# Patient Record
Sex: Female | Born: 1960
Health system: Southern US, Community
[De-identification: ages and names within clinical notes are randomized; demographics above are authoritative.]

## PROBLEM LIST (undated history)

## (undated) DIAGNOSIS — R590 Localized enlarged lymph nodes: Secondary | ICD-10-CM

## (undated) DIAGNOSIS — K219 Gastro-esophageal reflux disease without esophagitis: Secondary | ICD-10-CM

## (undated) DIAGNOSIS — D649 Anemia, unspecified: Secondary | ICD-10-CM

## (undated) DIAGNOSIS — I10 Essential (primary) hypertension: Secondary | ICD-10-CM

## (undated) DIAGNOSIS — C801 Malignant (primary) neoplasm, unspecified: Secondary | ICD-10-CM

## (undated) DIAGNOSIS — M1990 Unspecified osteoarthritis, unspecified site: Secondary | ICD-10-CM

## (undated) HISTORY — PX: TUBAL LIGATION: SHX77

## (undated) HISTORY — DX: Morbid (severe) obesity due to excess calories: E66.01

---

## 1999-11-06 ENCOUNTER — Emergency Department (HOSPITAL_COMMUNITY): Admission: EM | Admit: 1999-11-06 | Discharge: 1999-11-06 | Payer: Self-pay | Admitting: Emergency Medicine

## 1999-11-06 ENCOUNTER — Encounter: Payer: Self-pay | Admitting: Emergency Medicine

## 2000-10-03 ENCOUNTER — Emergency Department (HOSPITAL_COMMUNITY): Admission: EM | Admit: 2000-10-03 | Discharge: 2000-10-03 | Payer: Self-pay | Admitting: Emergency Medicine

## 2000-10-03 ENCOUNTER — Encounter: Payer: Self-pay | Admitting: Emergency Medicine

## 2002-01-12 ENCOUNTER — Emergency Department (HOSPITAL_COMMUNITY): Admission: EM | Admit: 2002-01-12 | Discharge: 2002-01-12 | Payer: Self-pay | Admitting: Emergency Medicine

## 2002-07-24 ENCOUNTER — Emergency Department (HOSPITAL_COMMUNITY): Admission: EM | Admit: 2002-07-24 | Discharge: 2002-07-24 | Payer: Self-pay | Admitting: Emergency Medicine

## 2002-07-24 ENCOUNTER — Encounter: Payer: Self-pay | Admitting: Emergency Medicine

## 2002-11-11 ENCOUNTER — Emergency Department (HOSPITAL_COMMUNITY): Admission: EM | Admit: 2002-11-11 | Discharge: 2002-11-11 | Payer: Self-pay | Admitting: Emergency Medicine

## 2003-03-20 ENCOUNTER — Emergency Department (HOSPITAL_COMMUNITY): Admission: EM | Admit: 2003-03-20 | Discharge: 2003-03-20 | Payer: Self-pay | Admitting: Emergency Medicine

## 2003-05-18 ENCOUNTER — Emergency Department (HOSPITAL_COMMUNITY): Admission: EM | Admit: 2003-05-18 | Discharge: 2003-05-18 | Payer: Self-pay | Admitting: *Deleted

## 2004-04-08 ENCOUNTER — Emergency Department (HOSPITAL_COMMUNITY): Admission: EM | Admit: 2004-04-08 | Discharge: 2004-04-08 | Payer: Self-pay | Admitting: Emergency Medicine

## 2006-06-02 ENCOUNTER — Emergency Department (HOSPITAL_COMMUNITY): Admission: EM | Admit: 2006-06-02 | Discharge: 2006-06-02 | Payer: Self-pay | Admitting: Emergency Medicine

## 2006-07-03 ENCOUNTER — Emergency Department (HOSPITAL_COMMUNITY): Admission: EM | Admit: 2006-07-03 | Discharge: 2006-07-04 | Payer: Self-pay | Admitting: Emergency Medicine

## 2007-05-27 ENCOUNTER — Emergency Department (HOSPITAL_COMMUNITY): Admission: EM | Admit: 2007-05-27 | Discharge: 2007-05-27 | Payer: Self-pay | Admitting: Emergency Medicine

## 2008-02-23 ENCOUNTER — Emergency Department (HOSPITAL_COMMUNITY): Admission: EM | Admit: 2008-02-23 | Discharge: 2008-02-23 | Payer: Self-pay | Admitting: Emergency Medicine

## 2008-05-07 ENCOUNTER — Emergency Department (HOSPITAL_COMMUNITY): Admission: EM | Admit: 2008-05-07 | Discharge: 2008-05-07 | Payer: Self-pay | Admitting: Emergency Medicine

## 2008-05-20 ENCOUNTER — Ambulatory Visit (HOSPITAL_COMMUNITY): Admission: RE | Admit: 2008-05-20 | Discharge: 2008-05-20 | Payer: Self-pay | Admitting: Family Medicine

## 2008-05-26 ENCOUNTER — Encounter: Admission: RE | Admit: 2008-05-26 | Discharge: 2008-05-26 | Payer: Self-pay | Admitting: Family Medicine

## 2009-03-09 ENCOUNTER — Emergency Department (HOSPITAL_COMMUNITY): Admission: EM | Admit: 2009-03-09 | Discharge: 2009-03-09 | Payer: Self-pay | Admitting: Emergency Medicine

## 2009-04-09 ENCOUNTER — Emergency Department (HOSPITAL_COMMUNITY): Admission: EM | Admit: 2009-04-09 | Discharge: 2009-04-09 | Payer: Self-pay | Admitting: Emergency Medicine

## 2009-09-08 ENCOUNTER — Emergency Department (HOSPITAL_COMMUNITY): Admission: EM | Admit: 2009-09-08 | Discharge: 2009-09-08 | Payer: Self-pay | Admitting: Emergency Medicine

## 2010-08-01 LAB — CBC
HCT: 24.8 % — ABNORMAL LOW (ref 36.0–46.0)
Hemoglobin: 7.5 g/dL — CL (ref 12.0–15.0)
Platelets: 258 10*3/uL (ref 150–400)
RBC: 4.3 MIL/uL (ref 3.87–5.11)
RDW: 21.8 % — ABNORMAL HIGH (ref 11.5–15.5)

## 2010-08-01 LAB — COMPREHENSIVE METABOLIC PANEL
ALT: 14 U/L (ref 0–35)
AST: 14 U/L (ref 0–37)
Albumin: 3 g/dL — ABNORMAL LOW (ref 3.5–5.2)
Calcium: 8.2 mg/dL — ABNORMAL LOW (ref 8.4–10.5)
Chloride: 109 mEq/L (ref 96–112)
Creatinine, Ser: 0.64 mg/dL (ref 0.4–1.2)
GFR calc Af Amer: >60 mL/min (ref >60)
Glucose, Bld: 116 mg/dL — ABNORMAL HIGH (ref 70–99)
Potassium: 3.8 mEq/L (ref 3.5–5.1)
Total Protein: 6.7 g/dL (ref 6.0–8.3)

## 2010-08-01 LAB — DIFFERENTIAL
Basophils Absolute: 0 10*3/uL (ref 0.0–0.1)
Eosinophils Relative: 0 % (ref 0–5)
Lymphocytes Relative: 13 % (ref 12–46)
Monocytes Relative: 0 % — ABNORMAL LOW (ref 3–12)
Neutro Abs: 5.7 10*3/uL (ref 1.7–7.7)

## 2010-09-09 ENCOUNTER — Emergency Department (HOSPITAL_COMMUNITY)
Admission: EM | Admit: 2010-09-09 | Discharge: 2010-09-09 | Disposition: A | Discharge status: Home / Self Care | Payer: 59 | Attending: Emergency Medicine | Admitting: Emergency Medicine

## 2010-09-09 DIAGNOSIS — R51 Headache: Secondary | ICD-10-CM | POA: Insufficient documentation

## 2010-09-09 DIAGNOSIS — I10 Essential (primary) hypertension: Secondary | ICD-10-CM | POA: Insufficient documentation

## 2010-09-09 LAB — POCT I-STAT, CHEM 8
Chloride: 106 mEq/L (ref 96–112)
Glucose, Bld: 94 mg/dL (ref 70–99)
HCT: 41 % (ref 36.0–46.0)

## 2011-03-20 ENCOUNTER — Emergency Department (HOSPITAL_COMMUNITY)
Admission: EM | Admit: 2011-03-20 | Discharge: 2011-03-20 | Disposition: A | Discharge status: Home / Self Care | Payer: 59 | Attending: Emergency Medicine | Admitting: Emergency Medicine

## 2011-03-20 ENCOUNTER — Encounter: Payer: Self-pay | Admitting: *Deleted

## 2011-03-20 DIAGNOSIS — R51 Headache: Secondary | ICD-10-CM | POA: Insufficient documentation

## 2011-03-20 DIAGNOSIS — I10 Essential (primary) hypertension: Secondary | ICD-10-CM

## 2011-03-20 DIAGNOSIS — R42 Dizziness and giddiness: Secondary | ICD-10-CM | POA: Insufficient documentation

## 2011-03-20 HISTORY — DX: Essential (primary) hypertension: I10

## 2011-03-20 HISTORY — DX: Anemia, unspecified: D64.9

## 2011-03-20 MED ORDER — AMLODIPINE-OLMESARTAN 5-20 MG PO TABS: 1.0000 | ORAL_TABLET | Freq: Every day | ORAL | Status: DC

## 2011-03-20 NOTE — ED Provider Notes (Signed)
History     CSN: [id number] Arrival date & time: 03/20/2011  4:08 PM   None    HPI Patient reports high blood pressure for 2 days. States she has been out of her meds for 2 days. High blood pressures is associated with headache and mild dizzy. Denies nausea, chest pain, shortness of breath, abdominal pain, polyuria, polydipsia. States has appointment with her PCP on Thursday. Reports usually her blood pressure is in the 140s over 70s Patient is a 50 y.o. female presenting with hypertension. The history is provided by the patient.  Hypertension This is a chronic problem. The current episode started yesterday. The problem has been gradually worsening. Associated symptoms include headaches. Pertinent negatives include no abdominal pain, chest pain, chills, congestion, coughing, fever, nausea, neck pain, numbness, vomiting or weakness.    Past Medical History  Diagnosis Date  . Hypertension   . Anemia     History reviewed. No pertinent past surgical history.  No family history on file.  History  Substance Use Topics  . Smoking status: Never Smoker   . Smokeless tobacco: Not on file  . Alcohol Use: No    OB History    Grav Para Term Preterm Abortions TAB SAB Ect Mult Living                  Review of Systems  Constitutional: Negative for fever and chills.  HENT: Negative for congestion, neck pain and neck stiffness.   Respiratory: Negative for cough and shortness of breath.   Cardiovascular: Negative for chest pain and palpitations.  Gastrointestinal: Negative for nausea, vomiting and abdominal pain.  Neurological: Positive for dizziness and headaches. Negative for weakness and numbness.  All other systems reviewed and are negative.    Allergies  Penicillins  Home Medications   Current Outpatient Rx  Name Route Sig Dispense Refill  . AZOR PO Oral Take by mouth.      . FERROUS SULFATE 325 (65 FE) MG PO TABS Oral Take 325 mg by mouth daily with breakfast.        BP  174/106  Pulse 79  Temp(Src) 97.7 F (36.5 C) (Oral)  Resp 20  Wt 255 lb (115.667 kg)  SpO2 100%  Physical Exam  Vitals reviewed. Constitutional: She is oriented to person, place, and time. Vital signs are normal. She appears well-developed and well-nourished.  HENT:  Head: Normocephalic and atraumatic.  Eyes: Conjunctivae are normal. Pupils are equal, round, and reactive to light.  Neck: Normal range of motion. Neck supple.  Cardiovascular: Normal rate, regular rhythm and normal heart sounds.  Exam reveals no friction rub.   No murmur heard. Pulmonary/Chest: Effort normal and breath sounds normal. She has no wheezes. She has no rhonchi. She has no rales. She exhibits no tenderness.  Musculoskeletal: Normal range of motion.  Neurological: She is alert and oriented to person, place, and time. Coordination normal.  Skin: Skin is warm and dry. No rash noted. No erythema. No pallor.    ED Course  Procedures   MDM         Thomasene Lot, [place] 03/20/11 1953

## 2011-03-20 NOTE — ED Notes (Signed)
Pt states "I've been monitoring my b/p & it's been high, can't see the doctor until Thursday"

## 2011-03-20 NOTE — ED Notes (Signed)
Has been out of her BP med. Since Saturday. Needs refill.

## 2011-03-22 NOTE — ED Provider Notes (Signed)
Medical screening examination/treatment/procedure(s) were performed by non-physician practitioner and as supervising physician I was immediately available for consultation/collaboration.  Toy Baker, MD 03/22/11 [phone]

## 2011-05-26 ENCOUNTER — Other Ambulatory Visit (HOSPITAL_COMMUNITY): Payer: Self-pay | Admitting: Obstetrics

## 2011-05-26 DIAGNOSIS — D219 Benign neoplasm of connective and other soft tissue, unspecified: Secondary | ICD-10-CM

## 2011-05-31 ENCOUNTER — Ambulatory Visit (HOSPITAL_COMMUNITY)
Admission: RE | Admit: 2011-05-31 | Discharge: 2011-05-31 | Disposition: A | Discharge status: Home / Self Care | Payer: 59 | Source: Ambulatory Visit | Attending: Obstetrics | Admitting: Obstetrics

## 2011-05-31 DIAGNOSIS — D219 Benign neoplasm of connective and other soft tissue, unspecified: Secondary | ICD-10-CM

## 2011-05-31 DIAGNOSIS — D259 Leiomyoma of uterus, unspecified: Secondary | ICD-10-CM | POA: Insufficient documentation

## 2011-05-31 DIAGNOSIS — N852 Hypertrophy of uterus: Secondary | ICD-10-CM | POA: Insufficient documentation

## 2011-06-06 ENCOUNTER — Encounter (HOSPITAL_COMMUNITY): Payer: Self-pay | Admitting: Pharmacy Technician

## 2011-06-09 NOTE — H&P (Signed)
NAME:  Samantha Santos, Samantha Santos NO.:  [id number]  MEDICAL RECORD NO.:  [id number]  LOCATION:  PERIO                         FACILITY:  [facility]  PHYSICIAN:  Kathreen Cosier, M.D.DATE OF BIRTH:  [birth year]  DATE OF ADMISSION:  06/05/2011 DATE OF DISCHARGE:                             HISTORY & PHYSICAL   DATE OF OPERATION:  May 20, 2011.  The patient is a 51 year old gravida 3, para 3-0-0-3, patient of Dr. Parke Simmers, no period for 2 years.  She started bleeding on May 23, 2011, heavy, and will have a hysteroscopy, D and C performed.  The patient had a Pap smear on May 24, 2011, in Dr. Tedra Sen[place] office.  PAST MEDICAL HISTORY:  She has a history of hypertension and she is on Tribenzor 45/25.  PAST SURGICAL HISTORY:  She had a C-section x1.  SOCIAL HISTORY:  Nonsmoker, nondrinker, non-drug user.  SYSTEM REVIEW:  Negative except allergy to PENICILLIN.  PHYSICAL EXAMINATION:  GENERAL:  Revealed well-developed female. HEENT:  Negative. BREASTS:  Negative. LUNGS:  Clear to P and A. HEART:  Regular rhythm.  No murmurs, no gallops. ABDOMEN:  There is a mass arising from the pelvis, 16 weeks' size. PELVIC:  Cervix was noted to be bloody, otherwise clean.  Bimanual exam revealed an enlarged uterus, 16-18 weeks' size. EXTREMITIES:  Legs negative.          ______________________________ Kathreen Cosier, M.D.     BAM/MEDQ  D:  06/09/2011  T:  06/09/2011  Job:  [id number]

## 2011-06-13 ENCOUNTER — Encounter (HOSPITAL_COMMUNITY)
Admission: RE | Admit: 2011-06-13 | Discharge: 2011-06-13 | Disposition: A | Discharge status: Home / Self Care | Payer: 59 | Source: Ambulatory Visit | Attending: Obstetrics | Admitting: Obstetrics

## 2011-06-13 ENCOUNTER — Encounter (HOSPITAL_COMMUNITY): Payer: Self-pay

## 2011-06-13 LAB — BASIC METABOLIC PANEL
BUN: 19 mg/dL (ref 6–23)
Chloride: 102 mEq/L (ref 96–112)
Creatinine, Ser: 1.08 mg/dL (ref 0.50–1.10)
GFR calc non Af Amer: 59 mL/min — ABNORMAL LOW (ref >90)
Glucose, Bld: 76 mg/dL (ref 70–99)
Potassium: 3.4 mEq/L — ABNORMAL LOW (ref 3.5–5.1)
Sodium: 138 mEq/L (ref 135–145)

## 2011-06-13 LAB — CBC
MCHC: 33.6 g/dL (ref 30.0–36.0)
MCV: 79.8 fL (ref 78.0–100.0)
Platelets: 143 10*3/uL — ABNORMAL LOW (ref 150–400)
RBC: 4.55 MIL/uL (ref 3.87–5.11)
RDW: 15.7 % — ABNORMAL HIGH (ref 11.5–15.5)

## 2011-06-13 NOTE — Patient Instructions (Addendum)
20 Samantha Santos  06/13/2011   Your procedure is scheduled on:  06/14/11  Enter through the Main Entrance of [facility] at 8 AM.  Pick up the phone at the desk and dial [phone].   Call this number if you have problems the morning of surgery: [phone]   Remember:   Do not eat food:After Midnight.  Do not drink clear liquids: After Midnight.  Take these medicines the morning of surgery with A SIP OF WATER: Blood Pressure medication   Do not wear jewelry, make-up or nail polish.  Do not wear lotions, powders, or perfumes. You may wear deodorant.  Do not shave 48 hours prior to surgery.  Do not bring valuables to the hospital.  Contacts, dentures or bridgework may not be worn into surgery.  Leave suitcase in the car. After surgery it may be brought to your room.  For patients admitted to the hospital, checkout time is 11:00 AM the day of discharge.   Patients discharged the day of surgery will not be allowed to drive home.  Name and phone number of your driver: daughter   [name]  Special Instructions: CHG Shower Use Special Wash: 1/2 bottle night before surgery and 1/2 bottle morning of surgery.   Please read over the following fact sheets that you were given: Surgical Site Infection Prevention

## 2011-06-14 ENCOUNTER — Encounter (HOSPITAL_COMMUNITY): Payer: Self-pay | Admitting: Anesthesiology

## 2011-06-14 ENCOUNTER — Ambulatory Visit (HOSPITAL_COMMUNITY): Payer: 59 | Admitting: Anesthesiology

## 2011-06-14 ENCOUNTER — Ambulatory Visit (HOSPITAL_COMMUNITY)
Admission: RE | Admit: 2011-06-14 | Discharge: 2011-06-14 | Disposition: A | Discharge status: Home / Self Care | Payer: 59 | Source: Ambulatory Visit | Attending: Obstetrics | Admitting: Obstetrics

## 2011-06-14 ENCOUNTER — Encounter (HOSPITAL_COMMUNITY)
Admission: RE | Disposition: A | Discharge status: Home / Self Care | Payer: Self-pay | Source: Ambulatory Visit | Attending: Obstetrics

## 2011-06-14 ENCOUNTER — Encounter (HOSPITAL_COMMUNITY): Payer: Self-pay | Admitting: *Deleted

## 2011-06-14 DIAGNOSIS — Z01818 Encounter for other preprocedural examination: Secondary | ICD-10-CM | POA: Insufficient documentation

## 2011-06-14 DIAGNOSIS — N949 Unspecified condition associated with female genital organs and menstrual cycle: Secondary | ICD-10-CM | POA: Insufficient documentation

## 2011-06-14 DIAGNOSIS — N938 Other specified abnormal uterine and vaginal bleeding: Secondary | ICD-10-CM | POA: Insufficient documentation

## 2011-06-14 DIAGNOSIS — Z01812 Encounter for preprocedural laboratory examination: Secondary | ICD-10-CM | POA: Insufficient documentation

## 2011-06-14 HISTORY — PX: HYSTEROSCOPY W/D&C: SHX1775

## 2011-06-14 SURGERY — DILATATION AND CURETTAGE /HYSTEROSCOPY
Anesthesia: General | Site: Vagina | Wound class: Clean Contaminated

## 2011-06-14 MED ORDER — MIDAZOLAM HCL 5 MG/5ML IJ SOLN
INTRAMUSCULAR | Status: DC | PRN
  Administered 2011-06-14: 2 mg via INTRAVENOUS

## 2011-06-14 MED ORDER — KETOROLAC TROMETHAMINE 30 MG/ML IJ SOLN
INTRAMUSCULAR | Status: DC | PRN
  Administered 2011-06-14: 30 mg via INTRAVENOUS

## 2011-06-14 MED ORDER — LACTATED RINGERS IV SOLN
INTRAVENOUS | Status: DC
  Administered 2011-06-14 (×2): via INTRAVENOUS

## 2011-06-14 MED ORDER — FENTANYL CITRATE 0.05 MG/ML IJ SOLN
INTRAMUSCULAR | Status: DC | PRN
  Administered 2011-06-14 (×2): 50 ug via INTRAVENOUS

## 2011-06-14 MED ORDER — LIDOCAINE HCL (CARDIAC) 20 MG/ML IV SOLN
INTRAVENOUS | Status: DC | PRN
  Administered 2011-06-14: 40 mg via INTRAVENOUS

## 2011-06-14 MED ORDER — ONDANSETRON HCL 4 MG/2ML IJ SOLN
INTRAMUSCULAR | Status: DC | PRN
  Administered 2011-06-14: 4 mg via INTRAVENOUS

## 2011-06-14 MED ORDER — DEXAMETHASONE SODIUM PHOSPHATE 4 MG/ML IJ SOLN
INTRAMUSCULAR | Status: DC | PRN
  Administered 2011-06-14: 5 mg via INTRAVENOUS

## 2011-06-14 MED ORDER — ONDANSETRON HCL 4 MG/2ML IJ SOLN
INTRAMUSCULAR | Status: AC
  Filled 2011-06-14: qty 2

## 2011-06-14 MED ORDER — MIDAZOLAM HCL 2 MG/2ML IJ SOLN
INTRAMUSCULAR | Status: AC
  Filled 2011-06-14: qty 2

## 2011-06-14 MED ORDER — LIDOCAINE HCL 1 % IJ SOLN
INTRAMUSCULAR | Status: DC | PRN
  Administered 2011-06-14: 10 mL

## 2011-06-14 MED ORDER — FENTANYL CITRATE 0.05 MG/ML IJ SOLN
INTRAMUSCULAR | Status: AC
  Filled 2011-06-14: qty 2

## 2011-06-14 MED ORDER — GLYCINE 1.5 % IR SOLN
Status: DC | PRN
  Administered 2011-06-14: 3000 mL

## 2011-06-14 MED ORDER — KETOROLAC TROMETHAMINE 30 MG/ML IJ SOLN
INTRAMUSCULAR | Status: AC
  Filled 2011-06-14: qty 1

## 2011-06-14 MED ORDER — DEXAMETHASONE SODIUM PHOSPHATE 10 MG/ML IJ SOLN
INTRAMUSCULAR | Status: AC
  Filled 2011-06-14: qty 1

## 2011-06-14 MED ORDER — PROPOFOL 10 MG/ML IV EMUL
INTRAVENOUS | Status: DC | PRN
  Administered 2011-06-14: 150 mg via INTRAVENOUS

## 2011-06-14 SURGICAL SUPPLY — 14 items
CANISTER SUCTION 2500CC (MISCELLANEOUS) ×2 IMPLANT
CATH ROBINSON RED A/P 16FR (CATHETERS) ×2 IMPLANT
CLOTH BEACON ORANGE TIMEOUT ST (SAFETY) ×2 IMPLANT
CONTAINER PREFILL 10% NBF 60ML (FORM) ×4 IMPLANT
ELECT REM PT RETURN 9FT ADLT (ELECTROSURGICAL)
ELECTRODE REM PT RTRN 9FT ADLT (ELECTROSURGICAL) IMPLANT
GLOVE BIO SURGEON STRL SZ8.5 (GLOVE) ×4 IMPLANT
GOWN PREVENTION PLUS LG XLONG (DISPOSABLE) ×2 IMPLANT
GOWN PREVENTION PLUS XXLARGE (GOWN DISPOSABLE) ×2 IMPLANT
GOWN STRL REIN XL XLG (GOWN DISPOSABLE) ×2 IMPLANT
LOOP ANGLED CUTTING 22FR (CUTTING LOOP) IMPLANT
PACK HYSTEROSCOPY LF (CUSTOM PROCEDURE TRAY) ×2 IMPLANT
TOWEL OR 17X24 6PK STRL BLUE (TOWEL DISPOSABLE) ×4 IMPLANT
WATER STERILE IRR 1000ML POUR (IV SOLUTION) ×2 IMPLANT

## 2011-06-14 NOTE — Anesthesia Postprocedure Evaluation (Signed)
Anesthesia Post Note  Patient: Samantha Santos  Procedure(s) Performed: Procedure(s) (LRB): DILATATION AND CURETTAGE /HYSTEROSCOPY (N/A)  Anesthesia type: General  Patient location: PACU  Post pain: Pain level controlled  Post assessment: Post-op Vital signs reviewed  Last Vitals:  Filed Vitals:   06/14/11 1100  BP: 89/60  Pulse: 67  Temp: 36.6 C  Resp: 16    Post vital signs: Reviewed  Level of consciousness: sedated  Complications: No apparent anesthesia complications

## 2011-06-14 NOTE — H&P (Signed)
  There has been no change in the patient's history and physical exam today is unchanged also and

## 2011-06-14 NOTE — Anesthesia Preprocedure Evaluation (Addendum)
Anesthesia Evaluation  Patient identified by MRN, date of birth, ID band Patient awake    Reviewed: Allergy & Precautions, H&P , Patient's Chart, lab work & pertinent test results, reviewed documented beta blocker date and time   Airway Mallampati: III TM Distance: >3 FB Neck ROM: full    Dental No notable dental hx. (+) Poor Dentition and Dental Advisory Given,    Pulmonary  clear to auscultation  Pulmonary exam normal       Cardiovascular hypertension, On Medications regular Normal    Neuro/Psych    GI/Hepatic   Endo/Other  Morbid obesity  Renal/GU      Musculoskeletal   Abdominal   Peds  Hematology   Anesthesia Other Findings   Reproductive/Obstetrics                          Anesthesia Physical Anesthesia Plan  ASA: III  Anesthesia Plan: General   Post-op Pain Management:    Induction: Intravenous  Airway Management Planned: LMA  Additional Equipment:   Intra-op Plan:   Post-operative Plan:   Informed Consent: I have reviewed the patients History and Physical, chart, labs and discussed the procedure including the risks, benefits and alternatives for the proposed anesthesia with the patient or authorized representative who has indicated his/her understanding and acceptance.   Dental Advisory Given  Plan Discussed with: CRNA and Surgeon  Anesthesia Plan Comments: (  Discussed  general anesthesia, including possible nausea, instrumentation of airway, sore throat,pulmonary aspiration, etc. I asked if the were any outstanding questions, or  concerns before we proceeded. )        Anesthesia Quick Evaluation

## 2011-06-14 NOTE — Transfer of Care (Signed)
Immediate Anesthesia Transfer of Care Note  Patient: Samantha Santos  Procedure(s) Performed: Procedure(s) (LRB): DILATATION AND CURETTAGE /HYSTEROSCOPY (N/A)  Patient Location: PACU  Anesthesia Type: General  Level of Consciousness: awake  Airway & Oxygen Therapy: Patient connected to nasal cannula oxygen  Post-op Assessment: Report given to PACU RN  Post vital signs: Reviewed and stable  Complications: No apparent anesthesia complications

## 2011-06-14 NOTE — Op Note (Signed)
preop diagnosis dysfunctional uterine bleeding postop diagnosis the same Anesthesia general Surgeon Dr. Francoise Ceo Proceed in on the general anesthesia patient in lithotomy position perineum and vagina prepped and draped data entered with a straight catheter bimanual exam revealed uterus to be 18 week size speculum was placed in the vagina cervix injected with 10 cc 1% Xylocaine anterior lip of the cervix grasped with tenaculum the endometrial cavity was sounded 11 cm cervix dilated to 25 Pratt hysteroscope inserted and cavity was noted to be clean the hysteroscope removed and and the endometrial cavity cavity was curetted a small amount of tissue was obtained patient tolerated the procedure well taken to recovery in good condition fluid deficit was 30 cc thank you and and

## 2011-06-14 NOTE — Discharge Instructions (Signed)
D&C Hysterosocpy °Care After °Read the instructions below. Refer to this sheet in the next few weeks. These instructions provide you with general information on caring for yourself after you leave the hospital. Your caregiver may also give you specific instructions.  °D&C or vacuum curettage is a minor operation. A D&C involves the stretching (dilatation) of the cervix and scraping (curettage) of the inside lining of the uterus. A vacuum curettage gently sucks out the lining and tissue in the uterus with a tube. You may have light cramping and bleeding for a couple of days to two weeks after the procedure. This procedure may be done in a hospital, outpatient clinic, or doctor's office. You may be given a drug to make you sleep (general anesthetic) or a drug that numbs the area (local anesthetic) in and around the cervix. °HOME CARE INSTRUCTIONS °· Do not drive for 24 hours.  °· Wait one week before returning to strenuous activities.  °· Take your temperature two times a day for 4 days and write it down. Provide these temperatures to your caregiver if they are abnormal (above 98.6° F or 37.0° C).  °· Avoid long periods of standing, and do no heavy lifting (more than 10 pounds), pushing or pulling.  °· Limit stair climbing to once or twice a day.  °· Take rest periods often.  °· You may resume your usual diet.  °· Drink plenty of fluids (6-8 glasses a day).  °· You should return to your usual bowel function. If constipation should occur, you may:  °· Take a mild laxative with permission from your caregiver.  °· Add fruit and bran to your diet.  °· Drink more fluids. This helps with constipation.  °· Take showers instead of baths until your caregiver gives you permission to take baths.  °· Do not go swimming or use a hot tub until your caregiver gives you permission.  °· Try to have someone with you or available for you the first 24 to 48 hours, especially if you had a general anesthetic.  °· Do not douche, use  tampons, or have intercourse until after your follow-up appointment, or when your caregiver approves.  °· Only take over-the-counter or prescription medicines for pain, discomfort, or fever as directed by your caregiver. Do not take aspirin. It can cause bleeding.  °· If a prescription was given, follow your caregiver's directions. You may be given a medicine that kills germs (antibiotic) to prevent an infection.  °· Keep all your follow-up appointments recommended by your caregiver.  °SEEK MEDICAL CARE IF: °· You have increasing cramps or pain not relieved with medication.  °· You develop belly (abdominal) pain which does not seem to be related to the same area of earlier cramping and pain.  °· You feel dizzy or feel like fainting.  °· You have bad smelling vaginal discharge.  °· You develop a rash.  °· You develop a reaction or allergy to your medication.  °SEEK IMMEDIATE MEDICAL CARE IF: °· Bleeding is heavier than a normal menstrual period.  °· You have an oral temperature above 100.6, not controlled by medicine.  °· You develop chest pain.  °· You develop shortness of breath.  °· You pass out.  °· You develop pain in your shoulder strap area.  °· You develop heavy vaginal bleeding with or without blood clots.  °MAKE SURE YOU:  °· Understand these instructions.  °· Will watch your condition.  °· Will get help right away if you are   not doing well or get worse.  °UPDATED HEALTH PRACTICES °· A PAP smear is done to screen for cervical cancer.  °· The first PAP smear should be done at age 21.  °· Between ages 21 and 29, PAP smears are repeated every 2 years.  °· Beginning at age 30, you are advised to have a PAP smear every 3 years as long as your past 3 PAP smears have been normal.  °· Some women have medical problems that increase the chance of getting cervical cancer. Talk to your caregiver about these problems. It is especially important to talk to your caregiver if a new problem develops soon after your last PAP  smear. In these cases, your caregiver may recommend more frequent screening and Pap smears.  °· The above recommendations are the same for women who have or have not gotten the vaccine for HPV (Human Papillomavirus).  °· If you had a hysterectomy for a problem that was not a cancer or a condition that could lead to cancer, then you no longer need Pap smears.  °· If you are between ages 65 and 70, and you have had normal Pap smears going back 10 years, you no longer need Pap smears.  °· If you have had past treatment for cervical cancer or a condition that could lead to cancer, you need Pap smears and screening for cancer for at least 20 years after your treatment.  °· Continue monthly self-breast examinations. Your caregiver can provide information and instructions for self-breast examination.  °Document Released: 03/31/2000 Document Re-Released: 09/21/2009 °[employer]® Patient Information ©2011 [employer]. DISCHARGE INSTRUCTIONS: D&C / D&E °The following instructions have been prepared to help you care for yourself upon your return home. °  °Personal hygiene: °• Use sanitary pads for vaginal drainage, not tampons. °• Shower the day after your procedure. °• NO tub baths, pools or Jacuzzis for 2-3 weeks. °• Wipe front to back after using the bathroom. ° °Activity and limitations: °• Do NOT drive or operate any equipment for 24 hours. The effects of anesthesia are still present and drowsiness may result. °• Do NOT rest in bed all day. °• Walking is encouraged. °• Walk up and down stairs slowly. °• You may resume your normal activity in one to two days or as indicated by your physician. ° °Sexual activity: NO intercourse for at least 2 weeks after the procedure, or as indicated by your physician. ° °Diet: Eat a light meal as desired this evening. You may resume your usual diet tomorrow. ° °Return to work: You may resume your work activities in one to two days or as indicated by your doctor. ° °What to expect after  your surgery: Expect to have vaginal bleeding/discharge for 2-3 days and spotting for up to 10 days. It is not unusual to have soreness for up to 1-2 weeks. You may have a slight burning sensation when you urinate for the first day. Mild cramps may continue for a couple of days. You may have a regular period in 2-6 weeks. ° °Call your doctor for any of the following: °• Excessive vaginal bleeding, saturating and changing one pad every hour. °• Inability to urinate 6 hours after discharge from hospital. °• Pain not relieved by pain medication. °• Fever of 100.4° F or greater. °• Unusual vaginal discharge or odor. ° °Return to office ________________ Call for an appointment ___________________ ° °Patient’s signature: ______________________ ° °Nurse’s signature ________________________ ° °Post Anesthesia Care Unit [phone] °  °

## 2011-08-31 ENCOUNTER — Other Ambulatory Visit: Payer: Self-pay | Admitting: Family Medicine

## 2011-08-31 DIAGNOSIS — N63 Unspecified lump in unspecified breast: Secondary | ICD-10-CM

## 2011-09-15 ENCOUNTER — Ambulatory Visit
Admission: RE | Admit: 2011-09-15 | Discharge: 2011-09-15 | Disposition: A | Discharge status: Home / Self Care | Payer: 59 | Source: Ambulatory Visit | Attending: Family Medicine | Admitting: Family Medicine

## 2011-09-15 DIAGNOSIS — N63 Unspecified lump in unspecified breast: Secondary | ICD-10-CM

## 2012-04-19 ENCOUNTER — Telehealth: Payer: Self-pay | Admitting: Obstetrics and Gynecology

## 2012-08-09 ENCOUNTER — Other Ambulatory Visit: Payer: Self-pay | Admitting: Internal Medicine

## 2012-08-09 DIAGNOSIS — Z1231 Encounter for screening mammogram for malignant neoplasm of breast: Secondary | ICD-10-CM

## 2012-09-17 ENCOUNTER — Ambulatory Visit: Payer: 59

## 2012-10-17 ENCOUNTER — Ambulatory Visit
Admission: RE | Admit: 2012-10-17 | Discharge: 2012-10-17 | Disposition: A | Discharge status: Home / Self Care | Payer: 59 | Source: Ambulatory Visit | Attending: Internal Medicine | Admitting: Internal Medicine

## 2012-10-17 DIAGNOSIS — Z1231 Encounter for screening mammogram for malignant neoplasm of breast: Secondary | ICD-10-CM

## 2012-10-22 ENCOUNTER — Other Ambulatory Visit: Payer: Self-pay | Admitting: Internal Medicine

## 2012-10-22 DIAGNOSIS — R928 Other abnormal and inconclusive findings on diagnostic imaging of breast: Secondary | ICD-10-CM

## 2012-11-07 ENCOUNTER — Ambulatory Visit
Admission: RE | Admit: 2012-11-07 | Discharge: 2012-11-07 | Disposition: A | Discharge status: Home / Self Care | Payer: 59 | Source: Ambulatory Visit | Attending: Internal Medicine | Admitting: Internal Medicine

## 2012-11-07 DIAGNOSIS — R928 Other abnormal and inconclusive findings on diagnostic imaging of breast: Secondary | ICD-10-CM

## 2013-04-24 ENCOUNTER — Emergency Department (HOSPITAL_COMMUNITY)
Admission: EM | Admit: 2013-04-24 | Discharge: 2013-04-24 | Disposition: A | Discharge status: Home / Self Care | Payer: 59 | Attending: Emergency Medicine | Admitting: Emergency Medicine

## 2013-04-24 ENCOUNTER — Emergency Department (HOSPITAL_COMMUNITY): Payer: 59

## 2013-04-24 ENCOUNTER — Encounter (HOSPITAL_COMMUNITY): Payer: Self-pay | Admitting: Emergency Medicine

## 2013-04-24 DIAGNOSIS — Z862 Personal history of diseases of the blood and blood-forming organs and certain disorders involving the immune mechanism: Secondary | ICD-10-CM | POA: Insufficient documentation

## 2013-04-24 DIAGNOSIS — Z880 Allergy status to penicillin: Secondary | ICD-10-CM | POA: Insufficient documentation

## 2013-04-24 DIAGNOSIS — R059 Cough, unspecified: Secondary | ICD-10-CM | POA: Insufficient documentation

## 2013-04-24 DIAGNOSIS — R05 Cough: Secondary | ICD-10-CM | POA: Insufficient documentation

## 2013-04-24 DIAGNOSIS — N390 Urinary tract infection, site not specified: Secondary | ICD-10-CM | POA: Insufficient documentation

## 2013-04-24 DIAGNOSIS — R0602 Shortness of breath: Secondary | ICD-10-CM | POA: Insufficient documentation

## 2013-04-24 DIAGNOSIS — R112 Nausea with vomiting, unspecified: Secondary | ICD-10-CM | POA: Insufficient documentation

## 2013-04-24 DIAGNOSIS — Z79899 Other long term (current) drug therapy: Secondary | ICD-10-CM | POA: Insufficient documentation

## 2013-04-24 DIAGNOSIS — A599 Trichomoniasis, unspecified: Secondary | ICD-10-CM

## 2013-04-24 DIAGNOSIS — I10 Essential (primary) hypertension: Secondary | ICD-10-CM | POA: Insufficient documentation

## 2013-04-24 DIAGNOSIS — R509 Fever, unspecified: Secondary | ICD-10-CM

## 2013-04-24 DIAGNOSIS — R109 Unspecified abdominal pain: Secondary | ICD-10-CM

## 2013-04-24 DIAGNOSIS — R000 Tachycardia, unspecified: Secondary | ICD-10-CM | POA: Insufficient documentation

## 2013-04-24 DIAGNOSIS — R1013 Epigastric pain: Secondary | ICD-10-CM | POA: Insufficient documentation

## 2013-04-24 DIAGNOSIS — A5900 Urogenital trichomoniasis, unspecified: Secondary | ICD-10-CM | POA: Insufficient documentation

## 2013-04-24 LAB — LIPASE, BLOOD: Lipase: 16 U/L (ref 11–59)

## 2013-04-24 LAB — URINALYSIS, ROUTINE W REFLEX MICROSCOPIC
BILIRUBIN URINE: NEGATIVE
GLUCOSE, UA: NEGATIVE mg/dL
KETONES UR: NEGATIVE mg/dL
Nitrite: NEGATIVE
PROTEIN: 30 mg/dL — AB
Specific Gravity, Urine: 1.011 (ref 1.005–1.030)
Urobilinogen, UA: 0.2 mg/dL (ref 0.0–1.0)
pH: 5 (ref 5.0–8.0)

## 2013-04-24 LAB — COMPREHENSIVE METABOLIC PANEL
ALK PHOS: 95 U/L (ref 39–117)
ALT: 12 U/L (ref 0–35)
AST: 13 U/L (ref 0–37)
Albumin: 3.4 g/dL — ABNORMAL LOW (ref 3.5–5.2)
BILIRUBIN TOTAL: 1.4 mg/dL — AB (ref 0.3–1.2)
BUN: 12 mg/dL (ref 6–23)
CHLORIDE: 100 meq/L (ref 96–112)
CO2: 25 meq/L (ref 19–32)
Calcium: 8.7 mg/dL (ref 8.4–10.5)
Creatinine, Ser: 1.17 mg/dL — ABNORMAL HIGH (ref 0.50–1.10)
GFR calc Af Amer: 61 mL/min — ABNORMAL LOW (ref >90)
GFR, EST NON AFRICAN AMERICAN: 53 mL/min — AB (ref >90)
GLUCOSE: 123 mg/dL — AB (ref 70–99)
Potassium: 3.3 mEq/L — ABNORMAL LOW (ref 3.7–5.3)
SODIUM: 137 meq/L (ref 137–147)
Total Protein: 7.7 g/dL (ref 6.0–8.3)

## 2013-04-24 LAB — CG4 I-STAT (LACTIC ACID): LACTIC ACID, VENOUS: 1.14 mmol/L (ref 0.5–2.2)

## 2013-04-24 LAB — CBC WITH DIFFERENTIAL/PLATELET
BASOS ABS: 0 10*3/uL (ref 0.0–0.1)
Basophils Relative: 0 % (ref 0–1)
Eosinophils Absolute: 0.1 10*3/uL (ref 0.0–0.7)
Eosinophils Relative: 1 % (ref 0–5)
HEMATOCRIT: 35.2 % — AB (ref 36.0–46.0)
Hemoglobin: 11.9 g/dL — ABNORMAL LOW (ref 12.0–15.0)
LYMPHS ABS: 1.1 10*3/uL (ref 0.7–4.0)
LYMPHS PCT: 9 % — AB (ref 12–46)
MCH: 25.8 pg — ABNORMAL LOW (ref 26.0–34.0)
MCHC: 33.8 g/dL (ref 30.0–36.0)
MCV: 76.4 fL — ABNORMAL LOW (ref 78.0–100.0)
Monocytes Absolute: 0.3 10*3/uL (ref 0.1–1.0)
Monocytes Relative: 3 % (ref 3–12)
NEUTROS ABS: 11.6 10*3/uL — AB (ref 1.7–7.7)
Neutrophils Relative %: 88 % — ABNORMAL HIGH (ref 43–77)
PLATELETS: 130 10*3/uL — AB (ref 150–400)
RBC: 4.61 MIL/uL (ref 3.87–5.11)
RDW: 16.4 % — AB (ref 11.5–15.5)
WBC: 13.2 10*3/uL — AB (ref 4.0–10.5)

## 2013-04-24 LAB — URINE MICROSCOPIC-ADD ON

## 2013-04-24 MED ORDER — SODIUM CHLORIDE 0.9 % IV BOLUS (SEPSIS)
1000.0000 mL | Freq: Once | INTRAVENOUS | Status: AC
  Administered 2013-04-24: 1000 mL via INTRAVENOUS

## 2013-04-24 MED ORDER — ACETAMINOPHEN 325 MG PO TABS
650.0000 mg | ORAL_TABLET | Freq: Once | ORAL | Status: AC
  Administered 2013-04-24: 650 mg via ORAL
  Filled 2013-04-24: qty 2

## 2013-04-24 MED ORDER — CEPHALEXIN 500 MG PO CAPS
500.0000 mg | ORAL_CAPSULE | Freq: Once | ORAL | Status: AC
  Administered 2013-04-24: 500 mg via ORAL
  Filled 2013-04-24: qty 1

## 2013-04-24 MED ORDER — METRONIDAZOLE 500 MG PO TABS: 500.0000 mg | ORAL_TABLET | Freq: Two times a day (BID) | ORAL | Status: DC

## 2013-04-24 MED ORDER — ONDANSETRON HCL 4 MG PO TABS: 4.0000 mg | ORAL_TABLET | Freq: Four times a day (QID) | ORAL | Status: DC

## 2013-04-24 MED ORDER — CEPHALEXIN 500 MG PO CAPS: 500.0000 mg | ORAL_CAPSULE | Freq: Two times a day (BID) | ORAL | Status: DC

## 2013-04-24 MED ORDER — METRONIDAZOLE 500 MG PO TABS
500.0000 mg | ORAL_TABLET | Freq: Once | ORAL | Status: AC
  Administered 2013-04-24: 500 mg via ORAL
  Filled 2013-04-24: qty 1

## 2013-04-24 NOTE — ED Notes (Signed)
Pt c/o fever; chills; vomiting; abd pain

## 2013-04-24 NOTE — Discharge Instructions (Signed)
Take Keflex and Flagyl as you have a urinary tract infection with trichomonas present. Recommend Zofran as needed for nausea and Tylenol/ibuprofen for discomfort and fever control. Recommend that you drink plenty of fluids. Return to the emergency department if symptoms worsen such as if you or abdominal pain worsens, you experience worsening vomiting that does not responded Zofran, or any other additional symptoms. Followup with your primary care provider.  Abdominal Pain Abdominal pain can be caused by many things. Your caregiver decides the seriousness of your pain by an examination and possibly blood tests and X-rays. Many cases can be observed and treated at home. Most abdominal pain is not caused by a disease and will probably improve without treatment. However, in many cases, more time must pass before a clear cause of the pain can be found. Before that point, it may not be known if you need more testing, or if hospitalization or surgery is needed. HOME CARE INSTRUCTIONS   Do not take laxatives unless directed by your caregiver.  Take pain medicine only as directed by your caregiver.  Only take over-the-counter or prescription medicines for pain, discomfort, or fever as directed by your caregiver.  Try a clear liquid diet (broth, tea, or water) for as long as directed by your caregiver. Slowly move to a bland diet as tolerated. SEEK IMMEDIATE MEDICAL CARE IF:   The pain does not go away.  You have a fever.  You keep throwing up (vomiting).  The pain is felt only in portions of the abdomen. Pain in the right side could possibly be appendicitis. In an adult, pain in the left lower portion of the abdomen could be colitis or diverticulitis.  You pass bloody or black tarry stools. MAKE SURE YOU:   Understand these instructions.  Will watch your condition.  Will get help right away if you are not doing well or get worse. Document Released: 01/11/2005 Document Revised: 06/26/2011  Document Reviewed: 11/20/2007 [facility] Patient Information 2014 [place].

## 2013-04-24 NOTE — ED Notes (Signed)
Ultrasound at bedside

## 2013-04-24 NOTE — ED Notes (Signed)
Pt is aware of the need for urine.

## 2013-04-24 NOTE — ED Provider Notes (Signed)
CSN: [id number]     Arrival date & time 04/24/13  0021 History   First MD Initiated Contact with Patient 04/24/13 0038     Chief Complaint  Patient presents with  . Fever   (Consider location/radiation/quality/duration/timing/severity/associated sxs/prior Treatment) HPI Comments: Patient is a 53 year old female with a history of hypertension and anemia who presents today for fever. Patient states that symptoms have been ongoing for the last 3 days. Patient versus a maximum temperature of 104F, per for head, yesterday. Patient has been using Tamiflu for symptoms with mild relief. She endorses associated abdominal pain in her epigastric region with associated nausea and nonbloody, nonbilious emesis. She also states she has had some chills and mild, sporadic shortness of breath. 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 She denies associated nasal congestion, round react, neck pain or stiffness, chest pain, diarrhea, melena or hematochezia, urinary symptoms, and numbness/tingling.  Patient is a 53 y.o. female presenting with fever. The history is provided by the patient. No language interpreter was used.  Fever Associated symptoms: cough, nausea and vomiting   Associated symptoms: no chest pain, no diarrhea and no dysuria     Past Medical History  Diagnosis Date  . Hypertension   . Anemia    Past Surgical History  Procedure Laterality Date  . Cesarean section  1988  . Hysteroscopy w/d&c  06/14/2011    Procedure: DILATATION AND CURETTAGE /HYSTEROSCOPY;  Surgeon: Frederico Hamman, MD;  Location: [facility];  Service: Gynecology;  Laterality: N/A;   No family history on file. History  Substance Use Topics  . Smoking status: Never Smoker   . Smokeless tobacco: Not on file  . Alcohol Use: No   OB History   Grav Para Term Preterm Abortions TAB SAB Ect Mult Living                 Review of Systems  Constitutional: Positive for fever.   Respiratory: Positive for cough and shortness of breath.   Cardiovascular: Negative for chest pain.  Gastrointestinal: Positive for nausea, vomiting and abdominal pain. Negative for diarrhea and blood in stool.  Genitourinary: Negative for dysuria, hematuria, flank pain, vaginal bleeding, vaginal discharge, vaginal pain and pelvic pain.  Neurological: Negative for numbness.  All other systems reviewed and are negative.    Allergies  Penicillins  Home Medications   Current Outpatient Rx  Name  Route  Sig  Dispense  Refill  . Olmesartan-Amlodipine-HCTZ (TRIBENZOR) 40-10-25 MG TABS   Oral   Take 1 tablet by mouth daily.         Marland Kitchen oseltamivir (TAMIFLU) 75 MG capsule   Oral   Take 75 mg by mouth daily.         . pantoprazole (PROTONIX) 40 MG tablet   Oral   Take 40 mg by mouth daily.         . cephALEXin (KEFLEX) 500 MG capsule   Oral   Take 1 capsule (500 mg total) by mouth 2 (two) times daily.   14 capsule   0   . metroNIDAZOLE (FLAGYL) 500 MG tablet   Oral   Take 1 tablet (500 mg total) by mouth 2 (two) times daily.   14 tablet   0   . ondansetron (ZOFRAN) 4 MG tablet   Oral   Take 1 tablet (4 mg total) by mouth every 6 (six) hours.   12 tablet   0    BP 101/61  Pulse 89  Temp(Src) 102.1 F (38.9 C) (Rectal)  Resp 18  SpO2 95%  Physical Exam  Nursing note and vitals reviewed. Constitutional: She is oriented to person, place, and time. She appears well-developed and well-nourished. No distress.  HENT:  Head: Normocephalic and atraumatic.  Eyes: Conjunctivae and EOM are normal. Pupils are equal, round, and reactive to light. No scleral icterus.  Neck: Normal range of motion.  Cardiovascular: Regular rhythm, normal heart sounds and intact distal pulses.  Tachycardia present.   Tachy to 104  Pulmonary/Chest: Effort normal. No respiratory distress. She has no wheezes. She has no rales.  Abdominal: Soft. There is tenderness (epigastrium). There is no  rebound and no guarding.  Soft obese abdomen  Musculoskeletal: Normal range of motion.  Neurological: She is alert and oriented to person, place, and time.  Skin: Skin is warm and dry. No rash noted. She is not diaphoretic. No erythema. No pallor.  Psychiatric: She has a normal mood and affect. Her behavior is normal.    ED Course  Procedures (including critical care time) Labs Review Labs Reviewed  CBC WITH DIFFERENTIAL - Abnormal; Notable for the following:    WBC 13.2 (*)    Hemoglobin 11.9 (*)    HCT 35.2 (*)    MCV 76.4 (*)    MCH 25.8 (*)    RDW 16.4 (*)    Platelets 130 (*)    Neutrophils Relative % 88 (*)    Neutro Abs 11.6 (*)    Lymphocytes Relative 9 (*)    All other components within normal limits  COMPREHENSIVE METABOLIC PANEL - Abnormal; Notable for the following:    Potassium 3.3 (*)    Glucose, Bld 123 (*)    Creatinine, Ser 1.17 (*)    Albumin 3.4 (*)    Total Bilirubin 1.4 (*)    GFR calc non Af Amer 53 (*)    GFR calc Af Amer 61 (*)    All other components within normal limits  URINALYSIS, ROUTINE W REFLEX MICROSCOPIC - Abnormal; Notable for the following:    APPearance TURBID (*)    Hgb urine dipstick MODERATE (*)    Protein, ur 30 (*)    Leukocytes, UA LARGE (*)    All other components within normal limits  URINE MICROSCOPIC-ADD ON - Abnormal; Notable for the following:    Squamous Epithelial / LPF MANY (*)    Bacteria, UA MANY (*)    All other components within normal limits  URINE CULTURE  LIPASE, BLOOD  CG4 I-STAT (LACTIC ACID)   Imaging Review Dg Chest 2 View  04/24/2013   CLINICAL DATA:  Shortness of breath, fever  EXAM: CHEST  2 VIEW  COMPARISON:  Prior radiograph from 06/02/2006  FINDINGS: The cardiac and mediastinal silhouettes are stable in size and contour, and remain within normal limits.  The lungs are normally inflated. No airspace consolidation, pleural effusion, or pulmonary edema is identified. There is no pneumothorax.  No acute  osseous abnormality identified.  IMPRESSION: No active cardiopulmonary disease.   Electronically Signed   By: Jeannine Boga M.D.   On: 04/24/2013 01:33   US Abdomen Complete  04/24/2013   CLINICAL DATA:  Abdominal pain and vomiting.  EXAM: ULTRASOUND ABDOMEN COMPLETE  COMPARISON:  None.  FINDINGS: Gallbladder:  No gallstones or wall thickening visualized. No sonographic Murphy sign noted.  Common bile duct:  Diameter:  Ranging between 6 and 4 mm.  Liver:  There is increased echogenicity and poor acoustic transmission  consistent with fatty infiltration. This limits sensitivity for detecting focal lesions.  IVC:  No abnormality visualized.  Pancreas:  Obscured by bowel gas.  Spleen:  Unremarkable size and appearance.  Right Kidney:  Length: 10 cm. Echogenicity within normal limits. No hydronephrosis.  Left Kidney:  Length: 10 cm. Echogenicity within normal limits. No hydronephrosis.  Abdominal aorta:  No aneurysm visualized.  Other findings:  None.  IMPRESSION: 1. No acute findings. 2. Fatty liver. 3. No cholelithiasis or cholecystitis.   Electronically Signed   By: Jorje Guild M.D.   On: 04/24/2013 02:35    EKG Interpretation    Date/Time:  Thursday April 24 2013 00:44:37 EST Ventricular Rate:  115 PR Interval:  143 QRS Duration: 81 QT Interval:  314 QTC Calculation: 434 R Axis:   -45 Text Interpretation:  Sinus tachycardia Consider left atrial enlargement Left anterior fascicular block Baseline wander in lead(s) II III aVR aVL aVF Confirmed by DELOS  MD, DOUGLAS ([id number]) on 04/24/2013 2:27:42 AM            MDM   1. Fever   2. Abdominal pain   3. UTI (urinary tract infection)   4. Trichomonas infection    Trichomonas UTI with fever. Patient with mild TTP in epigastrium only, relieved with IVF. No evidence of cholecystitis or cholelithiasis on abd U/S. CXR without PTX, pleural effusion, mediastinal widening, pulmonary edema, or focal consolidation or pneumonia. No peritoneal  signs or evidence of acute surgical abdomen. Given improved abdominal pain, stable serial abdominal examinations, preserved liver and kidney functions, normal lipase, and stable H/H compared to priors, do not believe emergent CT imaging is indicated at this time. Patient stable for d/c with course of Keflex and Flagyl for symptoms. Tylenol/ibuprofen advised for pain and fever control. Return precautions discussed and patient agreeable to plan with no unaddressed concerns.  Patient seen and examined also by Dr. Stark Jock who is in agreement with this work up, assessment, management plan, and patient's stability for d/c.    Antonietta Breach, PA-C 04/28/13 1934

## 2013-04-26 LAB — URINE CULTURE

## 2013-04-27 ENCOUNTER — Telehealth (HOSPITAL_COMMUNITY): Payer: Self-pay | Admitting: Emergency Medicine

## 2013-04-27 NOTE — ED Notes (Signed)
Post ED Visit - Positive Culture Follow-up  Culture report reviewed by antimicrobial stewardship pharmacist: []  Samantha Santos, Pharm.D., Samantha Santos []  Samantha Santos, Pharm.D., Samantha Santos []  Samantha Santos, Pharm.D., Samantha Santos []  Samantha Santos, [place].D., Samantha Santos, Samantha Santos []  Samantha Santos, Pharm.D., Samantha Santos, Samantha Santos [x]  Samantha Santos, Pharm.D., Samantha Santos  Positive urine culture Treated with Keflex, organism sensitive to the same and no further patient follow-up is required at this time.  Samantha Santos, Samantha Santos 04/27/2013, 1:18 PM

## 2013-04-29 NOTE — ED Provider Notes (Signed)
Medical screening examination/treatment/procedure(s) were conducted as a shared visit with non-physician practitioner(s) and myself.  I personally evaluated the patient during the encounter. Patient is a 53 year old female presents with complaints of fever. This is been going on for the past 3 days and reached as high as 104. She was recently put on Tamiflu however is not feeling better. She is now having upper abdominal discomfort along with nausea and vomiting. She denies any diarrhea or bloody stool.  On exam vitals are stable, however she is febrile with a temp of 102.1. Head is atraumatic normocephalic neck is supple. Heart is regular rate and rhythm. Lungs are clear and equal. There is tenderness to palpation in the epigastric region without rebound or guarding. Bowel sounds are present.   Workup reveals an elevated white count of 13,000. An ultrasound was obtained which revealed no evidence for acute cholecystitis. Urinalysis did return suggestive of a UTI. There is also Trichomonas noted in the urine. She will be discharged with the appropriate antibiotics and when necessary followup.  EKG Interpretation    Date/Time:  Thursday April 24 2013 00:44:37 EST Ventricular Rate:  115 PR Interval:  143 QRS Duration: 81 QT Interval:  314 QTC Calculation: 434 R Axis:   -45 Text Interpretation:  Sinus tachycardia Consider left atrial enlargement Left anterior fascicular block Baseline wander in lead(s) II III aVR aVL aVF Confirmed by Beau Fanny  MD, Arkel Cartwright ([id number]) on 04/24/2013 2:27:42 AM             Veryl Speak, MD 04/29/13 2204

## 2015-02-06 ENCOUNTER — Other Ambulatory Visit: Payer: Self-pay | Admitting: Internal Medicine

## 2015-02-06 DIAGNOSIS — Z1231 Encounter for screening mammogram for malignant neoplasm of breast: Secondary | ICD-10-CM

## 2016-09-24 ENCOUNTER — Encounter (HOSPITAL_COMMUNITY): Payer: Self-pay | Admitting: Emergency Medicine

## 2016-09-24 ENCOUNTER — Emergency Department (HOSPITAL_COMMUNITY): Payer: Managed Care, Other (non HMO)

## 2016-09-24 ENCOUNTER — Emergency Department (HOSPITAL_COMMUNITY)
Admission: EM | Admit: 2016-09-24 | Discharge: 2016-09-24 | Disposition: A | Discharge status: Home / Self Care | Payer: Managed Care, Other (non HMO) | Attending: Emergency Medicine | Admitting: Emergency Medicine

## 2016-09-24 DIAGNOSIS — Z79899 Other long term (current) drug therapy: Secondary | ICD-10-CM | POA: Diagnosis not present

## 2016-09-24 DIAGNOSIS — M109 Gout, unspecified: Secondary | ICD-10-CM | POA: Diagnosis not present

## 2016-09-24 DIAGNOSIS — I10 Essential (primary) hypertension: Secondary | ICD-10-CM | POA: Diagnosis not present

## 2016-09-24 DIAGNOSIS — M25472 Effusion, left ankle: Secondary | ICD-10-CM | POA: Insufficient documentation

## 2016-09-24 DIAGNOSIS — M25572 Pain in left ankle and joints of left foot: Secondary | ICD-10-CM | POA: Diagnosis present

## 2016-09-24 HISTORY — DX: Gastro-esophageal reflux disease without esophagitis: K21.9

## 2016-09-24 MED ORDER — NAPROXEN 500 MG PO TABS
500.0000 mg | ORAL_TABLET | Freq: Once | ORAL | Status: AC
  Administered 2016-09-24: 500 mg via ORAL
  Filled 2016-09-24: qty 1

## 2016-09-24 MED ORDER — NAPROXEN 500 MG PO TABS: 500.0000 mg | ORAL_TABLET | Freq: Two times a day (BID) | ORAL | 0 refills | Status: AC

## 2016-09-24 NOTE — Discharge Instructions (Signed)
Wear ankle brace for at least 2 weeks for stabilization of ankle. Use ice and heat to the ankle, and elevate ankle throughout the day, using ice/heat pack for no more than 20 minutes every hour for each.  Alternate between naprosyn and tylenol for pain relief. Avoid eating seafood, red meat, or alcohol consumption as this may cause gout to flare up, which could be contributing to your ankle pain/swelling. Stay well hydrated. Follow up with the podiatrist in 1-2 weeks for recheck and ongoing management of your ankle pain. Also follow up with your regular doctor in 1 week for recheck and ongoing management of your medical conditions. Return to the ER for changes or worsening symptoms.

## 2016-09-24 NOTE — ED Provider Notes (Signed)
[facility] DEPT Provider Note   CSN: [id number] Arrival date & time: 09/24/16  1731     History   Chief Complaint Chief Complaint  Patient presents with  . Ankle Pain    HPI Samantha Santos is a 56 y.o. female with a PMHx of gout, anemia, GERD, and HTN, who presents to the ED with complaints of left ankle pain and swelling 3 months. Patient states that she was in her kitchen chair in the process of getting up when the chair slipped from underneath her and struck her left ankle. She did not seek any medical care and thought that the pain would improve on its own however it's now 3 months later and the pain continues to occur, so she came in for evaluation. She describes the pain is 8/10 constant sharp nonradiating left ankle pain that worsens with walking particularly when walking up stairs, and unrelieved with rubbing alcohol and over-the-counter muscle cream. She has not used anything else for her symptoms. She reports lateral left ankle swelling. She denies any bruising, abrasions, warmth, erythema, numbness, tingling, focal weakness, calf pain or swelling, CP, SOB, or any other complaints at this time. Denies recent travel/surgery/immobilization, estrogen use, or personal/family history of DVT/PE. Reports +FHx of gout in her mother, and mentions that 2 years ago she personally had gout in her feet and ankles, diagnosed by her PCP, was advised to stop eating so much tilapia but wasn't placed on any meds for it. Endorses eating shrimp often, and occasionally eating hamburgers. Denies EtOH consumption. Denies personal hx of DM2 or kidney dysfunction.     The history is provided by the patient and medical records. No language interpreter was used.  Ankle Pain   The incident occurred more than 1 week ago. The incident occurred at home. The injury mechanism was a direct blow. The pain is present in the left ankle. The quality of the pain is described as sharp. The pain is at a severity of  8/10. The pain is moderate. The pain has been constant since onset. Pertinent negatives include no numbness, no inability to bear weight, no loss of motion, no muscle weakness, no loss of sensation and no tingling. She reports no foreign bodies present. The symptoms are aggravated by activity and bearing weight. Treatments tried: rubbing alcohol and OTC muscle cream. The treatment provided no relief.    Past Medical History:  Diagnosis Date  . Anemia   . GERD (gastroesophageal reflux disease)   . Hypertension     There are no active problems to display for this patient.   Past Surgical History:  Procedure Laterality Date  . CESAREAN SECTION  1988  . HYSTEROSCOPY W/D&C  06/14/2011   Procedure: DILATATION AND CURETTAGE /HYSTEROSCOPY;  Surgeon: Frederico Hamman, MD;  Location: [facility];  Service: Gynecology;  Laterality: N/A;    OB History    No data available       Home Medications    Prior to Admission medications   Medication Sig Start Date End Date Taking? Authorizing Provider  cephALEXin (KEFLEX) 500 MG capsule Take 1 capsule (500 mg total) by mouth 2 (two) times daily. 04/24/13   Antonietta Breach, PA-C  metroNIDAZOLE (FLAGYL) 500 MG tablet Take 1 tablet (500 mg total) by mouth 2 (two) times daily. 04/24/13   Antonietta Breach, PA-C  Olmesartan-Amlodipine-HCTZ (TRIBENZOR) 40-10-25 MG TABS Take 1 tablet by mouth daily.    [provider]  ondansetron (ZOFRAN) 4 MG tablet Take 1 tablet (4 mg total)  by mouth every 6 (six) hours. 04/24/13   Antonietta Breach, PA-C  oseltamivir (TAMIFLU) 75 MG capsule Take 75 mg by mouth daily.    [provider]  pantoprazole (PROTONIX) 40 MG tablet Take 40 mg by mouth daily.    [provider]    Family History No family history on file.  Social History Social History  Substance Use Topics  . Smoking status: Never Smoker  . Smokeless tobacco: Never Used  . Alcohol use No     Allergies   Penicillins   Review of  Systems Review of Systems  Respiratory: Negative for shortness of breath.   Cardiovascular: Negative for chest pain and leg swelling.  Musculoskeletal: Positive for arthralgias and joint swelling. Negative for myalgias.  Skin: Negative for color change and wound.  Allergic/Immunologic: Negative for immunocompromised state.  Neurological: Negative for tingling, weakness and numbness.  Psychiatric/Behavioral: Negative for confusion.     Physical Exam Updated Vital Signs BP (!) 156/98 (BP Location: Left Arm)   Pulse 82   Temp 97.9 F (36.6 C) (Oral)   Resp 18   Ht 5\' 7"  (1.702 m)   Wt 135.2 kg (298 lb)   SpO2 95%   BMI 46.67 kg/m   Physical Exam  Constitutional: She is oriented to person, place, and time. Vital signs are normal. She appears well-developed and well-nourished.  Non-toxic appearance. No distress.  Afebrile, nontoxic, NAD  HENT:  Head: Normocephalic and atraumatic.  Mouth/Throat: Mucous membranes are normal.  Eyes: Conjunctivae and EOM are normal. Right eye exhibits no discharge. Left eye exhibits no discharge.  Neck: Normal range of motion. Neck supple.  Cardiovascular: Normal rate and intact distal pulses.   Pulmonary/Chest: Effort normal. No respiratory distress.  Abdominal: Normal appearance. She exhibits no distension.  Musculoskeletal: Normal range of motion.       Left ankle: She exhibits swelling (trace, over lateral malleolus). She exhibits normal range of motion, no ecchymosis, no deformity, no laceration and normal pulse. Tenderness. Lateral malleolus tenderness found. Achilles tendon normal.  Body habitus slightly limits exam, both legs/ankles fairly obese but symmetric. No pedal pitting edema, no calf tenderness, neg homan's sign bilaterally. L ankle with FROM intact, with trace swelling to lateral malleolar area, no crepitus or deformity, with mild TTP of the lateral malleolus, but no TTP or swelling of fore foot or calf. No break in skin. No bruising or  erythema. No significant warmth. Achilles intact. Good pedal pulse and cap refill of all toes. Wiggling toes without difficulty. Sensation grossly intact. Soft compartments. Gait steady.   Neurological: She is alert and oriented to person, place, and time. She has normal strength. No sensory deficit.  Skin: Skin is warm, dry and intact. No rash noted.  Psychiatric: She has a normal mood and affect. Her behavior is normal.  Nursing note and vitals reviewed.    ED Treatments / Results  Labs (all labs ordered are listed, but only abnormal results are displayed) Labs Reviewed - No data to display  EKG  EKG Interpretation None       Radiology Dg Ankle Complete Left  Result Date: 09/24/2016 CLINICAL DATA:  Ankle pain after remote fall EXAM: LEFT ANKLE COMPLETE - 3+ VIEW COMPARISON:  None. FINDINGS: There is moderate soft tissue swelling at left ankle. No acute fracture or dislocation. Medium-sized plantar calcaneal enthesophyte. No ankle effusion. IMPRESSION: No fracture or dislocation of the left ankle. Electronically Signed   By: Ulyses Jarred M.D.   On: 09/24/2016 18:37  Procedures Procedures (including critical care time)  Medications Ordered in ED Medications  naproxen (NAPROSYN) tablet 500 mg (500 mg Oral Given 09/24/16 1847)     Initial Impression / Assessment and Plan / ED Course  I have reviewed the triage vital signs and the nursing notes.  Pertinent labs & imaging results that were available during my care of the patient were reviewed by me and considered in my medical decision making (see chart for details).     56 y.o. female here with L ankle pain/swelling x3 months after she was getting up from kitchen chair and it went out from under her and struck her in the ankle. Never got assessed for it, but continues to bother her. Mentions that she had hx of gout 71yrs ago in her feet and ankles, was told to not eat fish, but doesn't take anything for it. States she eats  shrimp often. On exam, obesity mildly limits exam, both legs and ankles fairly large but symmetric, L ankle with mild lateral malleolus tenderness and trace swelling, no significant warmth or erythema, no bruising, skin intact, NVI with soft compartments, no calf pain/swelling, neg homan's. Doubt DVT, could be gout vs other arthritis, doubt acute trauma given duration of time that has passed, but will get xray to eval for arthritis/injury/etc. Will give naprosyn, and reassess shortly.   7:33 PM Xray negative aside from calcaneal enthesophyte. Pain could be related to remote sprain/strain/contusion, or gout. Will place in ASO brace for comfort. Will d/c home with scheduled NSAID therapy; Since we have no recent labs, unclear what her kidney function is (although prior labs show normal kidney function), so for now will hold off on giving colchicine; also since it's not clear that it's gout, will just opt for NSAID therapy since this would help with either inflammation or gout related pain. Advised RICE, heat use, tylenol use, and f/up with podiatry in 1-2wks for recheck and ongoing management; advised avoidance of seafood/red meat/EtOH consumption. Advised f/up with PCP in 1-2wks as well, for recheck and ongoing management. I explained the diagnosis and have given explicit precautions to return to the ER including for any other new or worsening symptoms. The patient understands and accepts the medical plan as it's been dictated and I have answered their questions. Discharge instructions concerning home care and prescriptions have been given. The patient is STABLE and is discharged to home in good condition.    Final Clinical Impressions(s) / ED Diagnoses   Final diagnoses:  Acute left ankle pain  Left ankle swelling  Acute gout of left ankle, unspecified cause    New Prescriptions New Prescriptions   NAPROXEN (NAPROSYN) 500 MG TABLET    Take 1 tablet (500 mg total) by mouth 2 (two) times daily with a  meal.     [address] [place], [place] 09/24/16 1936    Tegeler, Gwenyth Allegra, MD 09/25/16 [phone]

## 2016-09-24 NOTE — ED Triage Notes (Signed)
Pt from home with c/o left ankle pain following a fall 3 months ago. Pt states she thought the pain would go away, but it never did. Pt is able to ambulate

## 2017-06-19 ENCOUNTER — Other Ambulatory Visit: Payer: Self-pay

## 2017-06-19 ENCOUNTER — Encounter (HOSPITAL_COMMUNITY): Payer: Self-pay | Admitting: Emergency Medicine

## 2017-06-19 ENCOUNTER — Emergency Department (HOSPITAL_COMMUNITY)
Admission: EM | Admit: 2017-06-19 | Discharge: 2017-06-19 | Disposition: A | Discharge status: Home / Self Care | Payer: 59 | Attending: Emergency Medicine | Admitting: Emergency Medicine

## 2017-06-19 DIAGNOSIS — M542 Cervicalgia: Secondary | ICD-10-CM | POA: Insufficient documentation

## 2017-06-19 DIAGNOSIS — Y939 Activity, unspecified: Secondary | ICD-10-CM | POA: Diagnosis not present

## 2017-06-19 DIAGNOSIS — Y999 Unspecified external cause status: Secondary | ICD-10-CM | POA: Diagnosis not present

## 2017-06-19 DIAGNOSIS — Z79899 Other long term (current) drug therapy: Secondary | ICD-10-CM | POA: Diagnosis not present

## 2017-06-19 DIAGNOSIS — Y929 Unspecified place or not applicable: Secondary | ICD-10-CM | POA: Diagnosis not present

## 2017-06-19 DIAGNOSIS — Z041 Encounter for examination and observation following transport accident: Secondary | ICD-10-CM | POA: Diagnosis present

## 2017-06-19 DIAGNOSIS — I10 Essential (primary) hypertension: Secondary | ICD-10-CM | POA: Diagnosis not present

## 2017-06-19 MED ORDER — NAPROXEN 500 MG PO TABS
500.0000 mg | ORAL_TABLET | Freq: Once | ORAL | Status: AC
Start: 2017-06-19 — End: 2017-06-19
  Administered 2017-06-19: 500 mg via ORAL
  Filled 2017-06-19: qty 1

## 2017-06-19 MED ORDER — NAPROXEN 500 MG PO TABS: 500.0000 mg | ORAL_TABLET | Freq: Two times a day (BID) | ORAL | 0 refills | Status: DC

## 2017-06-19 MED ORDER — METHOCARBAMOL 500 MG PO TABS: 500.0000 mg | ORAL_TABLET | Freq: Three times a day (TID) | ORAL | 0 refills | Status: DC | PRN

## 2017-06-19 NOTE — ED Notes (Signed)
Bed: [facility] Expected date:  Expected time:  Means of arrival:  Comments: 
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
## 2017-12-26 ENCOUNTER — Encounter (HOSPITAL_COMMUNITY): Payer: Self-pay

## 2017-12-26 ENCOUNTER — Emergency Department (HOSPITAL_COMMUNITY)
Admission: EM | Admit: 2017-12-26 | Discharge: 2017-12-26 | Disposition: A | Discharge status: Home / Self Care | Payer: Managed Care, Other (non HMO) | Attending: Emergency Medicine | Admitting: Emergency Medicine

## 2017-12-26 ENCOUNTER — Other Ambulatory Visit: Payer: Self-pay

## 2017-12-26 DIAGNOSIS — R599 Enlarged lymph nodes, unspecified: Secondary | ICD-10-CM

## 2017-12-26 DIAGNOSIS — I10 Essential (primary) hypertension: Secondary | ICD-10-CM | POA: Insufficient documentation

## 2017-12-26 DIAGNOSIS — Z79899 Other long term (current) drug therapy: Secondary | ICD-10-CM | POA: Insufficient documentation

## 2017-12-26 DIAGNOSIS — R590 Localized enlarged lymph nodes: Secondary | ICD-10-CM | POA: Insufficient documentation

## 2017-12-26 NOTE — Discharge Instructions (Signed)
You may take iburprofen or tylenol for your pain.Supraclavicular lymph node should go away in 1-2 weeks.Please follow up with PCP in 1-2 weeks for reevaluation. If your symptoms worsen or you experience any chest pain or shortness of breath please return to the ED

## 2017-12-26 NOTE — ED Triage Notes (Signed)
Pt states that she felt a knot on the lower right side of her neck today

## 2017-12-26 NOTE — ED Provider Notes (Signed)
[facility] DEPT Provider Note   CSN: [id number] Arrival date & time: 12/26/17  1440     History   Chief Complaint Chief Complaint  Patient presents with  . Abscess    HPI Samantha Santos is a 57 y.o. female.  57 y/o female with a PMH of HTN presents to the ED with a chief complaint of right side neck bump.  Reports she first noticed this "abscess "when she was putting on close in the bathroom.  She describes it as tender and worse with neck movement.  Has not tried any medical therapy for relief.  He denies any fever, sore throat, cough or other URI symptoms.     Past Medical History:  Diagnosis Date  . Anemia   . GERD (gastroesophageal reflux disease)   . Hypertension     There are no active problems to display for this patient.   Past Surgical History:  Procedure Laterality Date  . CESAREAN SECTION  1988  . HYSTEROSCOPY W/D&C  06/14/2011   Procedure: DILATATION AND CURETTAGE /HYSTEROSCOPY;  Surgeon: Frederico Hamman, MD;  Location: [facility];  Service: Gynecology;  Laterality: N/A;     OB History   None      Home Medications    Prior to Admission medications   Medication Sig Start Date End Date Taking? Authorizing Provider  cephALEXin (KEFLEX) 500 MG capsule Take 1 capsule (500 mg total) by mouth 2 (two) times daily. 04/24/13   Antonietta Breach, PA-C  methocarbamol (ROBAXIN) 500 MG tablet Take 1 tablet (500 mg total) by mouth every 8 (eight) hours as needed for muscle spasms. 06/19/17   Petrucelli, Samantha R, PA-C  metroNIDAZOLE (FLAGYL) 500 MG tablet Take 1 tablet (500 mg total) by mouth 2 (two) times daily. 04/24/13   Antonietta Breach, PA-C  naproxen (NAPROSYN) 500 MG tablet Take 1 tablet (500 mg total) by mouth 2 (two) times daily. 06/19/17   Petrucelli, Samantha R, PA-C  Olmesartan-Amlodipine-HCTZ (TRIBENZOR) 40-10-25 MG TABS Take 1 tablet by mouth daily.    [provider]  ondansetron (ZOFRAN) 4 MG tablet Take 1 tablet (4 mg  total) by mouth every 6 (six) hours. 04/24/13   Antonietta Breach, PA-C  oseltamivir (TAMIFLU) 75 MG capsule Take 75 mg by mouth daily.    [provider]  pantoprazole (PROTONIX) 40 MG tablet Take 40 mg by mouth daily.    [provider]    Family History No family history on file.  Social History Social History   Tobacco Use  . Smoking status: Never Smoker  . Smokeless tobacco: Never Used  Substance Use Topics  . Alcohol use: No  . Drug use: No     Allergies   Penicillins   Review of Systems Review of Systems  Constitutional: Negative for fatigue and fever.  HENT: Negative for sore throat.   Respiratory: Negative for cough.   Cardiovascular: Negative for chest pain.  All other systems reviewed and are negative.    Physical Exam Updated Vital Signs BP (!) 141/101 (BP Location: Left Arm)   Pulse 92   Temp 97.7 F (36.5 C) (Oral)   Resp 18   Ht 5\' 7"  (1.702 m)   Wt 131.5 kg   SpO2 99%   BMI 45.42 kg/m   Physical Exam  Constitutional: She appears well-developed and well-nourished.  Eyes: Pupils are equal, round, and reactive to light.  Neck: Normal range of motion. Neck supple. No JVD present. No tracheal deviation present.  Cardiovascular:  Normal heart sounds.  No bruit present on right side or left side of her neck.  Pulmonary/Chest: Breath sounds normal. She has no wheezes.  Abdominal: Soft.  Lymphadenopathy:    She has no cervical adenopathy.       Right: Supraclavicular adenopathy present.  Mobile supraclavicular node on right side, tender to palpation.  Nursing note and vitals reviewed.    ED Treatments / Results  Labs (all labs ordered are listed, but only abnormal results are displayed) Labs Reviewed - No data to display  EKG None  Radiology No results found.  Procedures Procedures (including critical care time)  Medications Ordered in ED Medications - No data to display   Initial Impression / Assessment and Plan / ED  Course  I have reviewed the triage vital signs and the nursing notes.  Pertinent labs & imaging results that were available during my care of the patient were reviewed by me and considered in my medical decision making (see chart for details).     She presents with a right supraclavicular lymph node which first showed up this morning.  Patient has not tried any medical therapy for her symptoms.  There is tenderness upon palpation of the node.  Have discussed this patient with Dr. Wilson Singer who has seen the patient and evaluated the patient.  At this time will treat patient with NSAIDs and ibuprofen for pain she is to follow-up with the PCP in 1 to 2 weeks to make sure lymph no has gone away.  Patient understands and agrees with plan.  Return precautions provided.  Final Clinical Impressions(s) / ED Diagnoses   Final diagnoses:  Lymph node enlargement    ED Discharge Orders    None       Janeece Fitting, Hershal Coria 12/26/17 1522    Virgel Manifold, MD 12/29/17 1150

## 2017-12-26 NOTE — ED Provider Notes (Signed)
Medical screening examination/treatment/procedure(s) were conducted as a shared visit with non-physician practitioner(s) and myself.  I personally evaluated the patient during the encounter.  None  57yF with small R lateral mass.  I suspect this is an enlarge supraclavicular node. She really has no other symptoms. No overlying skin changes. I do not think this is a vascular issue. At this point, I would continue to observe it. Repeat evaluation if persists beyond a couple weeks. Emergent return precautions sooner discussed.    Virgel Manifold, MD 12/26/17 1520

## 2018-04-06 ENCOUNTER — Encounter (HOSPITAL_COMMUNITY): Payer: Self-pay

## 2018-04-06 ENCOUNTER — Emergency Department (HOSPITAL_COMMUNITY)
Admission: EM | Admit: 2018-04-06 | Discharge: 2018-04-06 | Disposition: A | Discharge status: Home / Self Care | Payer: Medicaid Other | Attending: Emergency Medicine | Admitting: Emergency Medicine

## 2018-04-06 ENCOUNTER — Emergency Department (HOSPITAL_COMMUNITY): Payer: Medicaid Other

## 2018-04-06 DIAGNOSIS — R222 Localized swelling, mass and lump, trunk: Secondary | ICD-10-CM | POA: Insufficient documentation

## 2018-04-06 DIAGNOSIS — R0789 Other chest pain: Secondary | ICD-10-CM | POA: Diagnosis present

## 2018-04-06 DIAGNOSIS — I10 Essential (primary) hypertension: Secondary | ICD-10-CM | POA: Diagnosis not present

## 2018-04-06 DIAGNOSIS — Z79899 Other long term (current) drug therapy: Secondary | ICD-10-CM | POA: Insufficient documentation

## 2018-04-06 LAB — BASIC METABOLIC PANEL
Anion gap: 8 (ref 5–15)
BUN: 17 mg/dL (ref 6–20)
CHLORIDE: 109 mmol/L (ref 98–111)
CO2: 26 mmol/L (ref 22–32)
CREATININE: 0.93 mg/dL (ref 0.44–1.00)
Calcium: 8.4 mg/dL — ABNORMAL LOW (ref 8.9–10.3)
GFR calc non Af Amer: >60 mL/min (ref >60)
GLUCOSE: 91 mg/dL (ref 70–99)
Potassium: 3.5 mmol/L (ref 3.5–5.1)
Sodium: 143 mmol/L (ref 135–145)

## 2018-04-06 LAB — CBC
HCT: 38.5 % (ref 36.0–46.0)
Hemoglobin: 11.8 g/dL — ABNORMAL LOW (ref 12.0–15.0)
MCH: 25.1 pg — ABNORMAL LOW (ref 26.0–34.0)
MCHC: 30.6 g/dL (ref 30.0–36.0)
MCV: 81.7 fL (ref 80.0–100.0)
NRBC: 0 % (ref 0.0–0.2)
Platelets: 180 10*3/uL (ref 150–400)
RBC: 4.71 MIL/uL (ref 3.87–5.11)
RDW: 16.2 % — ABNORMAL HIGH (ref 11.5–15.5)
WBC: 6.5 10*3/uL (ref 4.0–10.5)

## 2018-04-06 LAB — I-STAT TROPONIN, ED: Troponin i, poc: 0.01 ng/mL (ref 0.00–0.08)

## 2018-04-06 LAB — I-STAT BETA HCG BLOOD, ED (MC, WL, AP ONLY)

## 2018-04-06 LAB — POCT I-STAT TROPONIN I: TROPONIN I, POC: 0 ng/mL (ref 0.00–0.08)

## 2018-04-06 MED ORDER — LOSARTAN POTASSIUM-HCTZ 100-12.5 MG PO TABS: 1.0000 | ORAL_TABLET | Freq: Every day | ORAL | 1 refills | Status: DC

## 2018-04-06 MED ORDER — IOPAMIDOL (ISOVUE-370) INJECTION 76%
INTRAVENOUS | Status: AC
  Filled 2018-04-06: qty 100

## 2018-04-06 MED ORDER — SODIUM CHLORIDE (PF) 0.9 % IJ SOLN
INTRAMUSCULAR | Status: AC
  Filled 2018-04-06: qty 50

## 2018-04-06 MED ORDER — IOPAMIDOL (ISOVUE-370) INJECTION 76%
100.0000 mL | Freq: Once | INTRAVENOUS | Status: AC | PRN
  Administered 2018-04-06: 100 mL via INTRAVENOUS

## 2018-04-06 MED ORDER — ACETAMINOPHEN 500 MG PO TABS
1000.0000 mg | ORAL_TABLET | Freq: Once | ORAL | Status: AC
  Administered 2018-04-06: 1000 mg via ORAL
  Filled 2018-04-06: qty 2

## 2018-04-06 NOTE — ED Triage Notes (Signed)
Pt complains of headache, chest pain, generalized body aches since yesterday.

## 2018-04-06 NOTE — Discharge Instructions (Addendum)
You need to start taking her blood pressure medicine as prescribed.  You have several enlarged lymph nodes in your chest that need close follow-up.  You will likely need a PET scan and follow-up biopsy of 1 of the lesions.  It is important that you follow-up with your primary care physician regarding this.  Return to the emergency room if you have any worsening symptoms.

## 2018-04-06 NOTE — ED Notes (Signed)
Belfi, MD notified about pt's blood pressure trend

## 2018-04-06 NOTE — ED Provider Notes (Signed)
[facility] DEPT Provider Note   CSN: [id number] Arrival date & time: 04/06/18  1302     History   Chief Complaint Chief Complaint  Patient presents with  . Chest Pain  . Headache    HPI Samantha Santos is a 57 y.o. female.  Patient is a 57 year old female who presents with chest pain.  She reports a 2 to 3-day history of pain in the center of her chest.  It is fairly constant.  It is worse when she tries to sit up or twists.  She has some occasional shortness of breath.  No nausea or vomiting.  She has a little bit of cough which is nonproductive.  No fevers.  She has baseline leg swelling without increase in this.  No history of heart problems in the past.  She does have a history of hypertension, GERD and anemia.  She does not take anything for the pain.  It does not seem to be exertional in nature.     Past Medical History:  Diagnosis Date  . Anemia   . GERD (gastroesophageal reflux disease)   . Hypertension     There are no active problems to display for this patient.   Past Surgical History:  Procedure Laterality Date  . CESAREAN SECTION  1988  . HYSTEROSCOPY W/D&C  06/14/2011   Procedure: DILATATION AND CURETTAGE /HYSTEROSCOPY;  Surgeon: Frederico Hamman, MD;  Location: [facility];  Service: Gynecology;  Laterality: N/A;     OB History   No obstetric history on file.      Home Medications    Prior to Admission medications   Medication Sig Start Date End Date Taking? Authorizing Provider  cephALEXin (KEFLEX) 500 MG capsule Take 1 capsule (500 mg total) by mouth 2 (two) times daily. 04/24/13   Antonietta Breach, PA-C  losartan-hydrochlorothiazide (HYZAAR) 100-12.5 MG tablet Take 1 tablet by mouth daily. 04/06/18   Malvin Johns, MD  methocarbamol (ROBAXIN) 500 MG tablet Take 1 tablet (500 mg total) by mouth every 8 (eight) hours as needed for muscle spasms. 06/19/17   Petrucelli, Samantha R, PA-C  metroNIDAZOLE (FLAGYL) 500 MG tablet  Take 1 tablet (500 mg total) by mouth 2 (two) times daily. 04/24/13   Antonietta Breach, PA-C  naproxen (NAPROSYN) 500 MG tablet Take 1 tablet (500 mg total) by mouth 2 (two) times daily. 06/19/17   Petrucelli, Samantha R, PA-C  Olmesartan-Amlodipine-HCTZ (TRIBENZOR) 40-10-25 MG TABS Take 1 tablet by mouth daily.    [provider]  ondansetron (ZOFRAN) 4 MG tablet Take 1 tablet (4 mg total) by mouth every 6 (six) hours. 04/24/13   Antonietta Breach, PA-C  oseltamivir (TAMIFLU) 75 MG capsule Take 75 mg by mouth daily.    [provider]  pantoprazole (PROTONIX) 40 MG tablet Take 40 mg by mouth daily.    [provider]    Family History No family history on file.  Social History Social History   Tobacco Use  . Smoking status: Never Smoker  . Smokeless tobacco: Never Used  Substance Use Topics  . Alcohol use: No  . Drug use: No     Allergies   Penicillins   Review of Systems Review of Systems  Constitutional: Negative for chills, diaphoresis, fatigue and fever.  HENT: Negative for congestion, rhinorrhea and sneezing.   Eyes: Negative.   Respiratory: Positive for shortness of breath. Negative for cough and chest tightness.   Cardiovascular: Positive for chest pain. Negative for leg swelling.  Gastrointestinal:  Negative for abdominal pain, blood in stool, diarrhea, nausea and vomiting.  Genitourinary: Negative for difficulty urinating, flank pain, frequency and hematuria.  Musculoskeletal: Negative for arthralgias and back pain.  Skin: Negative for rash.  Neurological: Negative for dizziness, speech difficulty, weakness, numbness and headaches.     Physical Exam Updated Vital Signs BP (!) 183/128 (BP Location: Left Arm)   Pulse 90   Temp 97.7 F (36.5 C)   Resp 20   Ht 5\' 7"  (1.702 m)   Wt 133.8 kg   SpO2 95%   BMI 46.20 kg/m   Physical Exam Constitutional:      Appearance: She is well-developed.  HENT:     Head: Normocephalic and atraumatic.  Eyes:      Pupils: Pupils are equal, round, and reactive to light.  Neck:     Musculoskeletal: Normal range of motion and neck supple.  Cardiovascular:     Rate and Rhythm: Normal rate and regular rhythm.     Heart sounds: Normal heart sounds.  Pulmonary:     Effort: Pulmonary effort is normal. No respiratory distress.     Breath sounds: Normal breath sounds. No wheezing or rales.  Chest:     Chest wall: Tenderness (Tenderness over the sternum) present.  Abdominal:     General: Bowel sounds are normal.     Palpations: Abdomen is soft.     Tenderness: There is no abdominal tenderness. There is no guarding or rebound.  Musculoskeletal: Normal range of motion.  Lymphadenopathy:     Cervical: No cervical adenopathy.  Skin:    General: Skin is warm and dry.     Findings: No rash.  Neurological:     Mental Status: She is alert and oriented to person, place, and time.      ED Treatments / Results  Labs (all labs ordered are listed, but only abnormal results are displayed) Labs Reviewed  BASIC METABOLIC PANEL - Abnormal; Notable for the following components:      Result Value   Calcium 8.4 (*)    All other components within normal limits  CBC - Abnormal; Notable for the following components:   Hemoglobin 11.8 (*)    MCH 25.1 (*)    RDW 16.2 (*)    All other components within normal limits  I-STAT TROPONIN, ED  I-STAT BETA HCG BLOOD, ED ([facility], [facility], [facility] ONLY)  I-STAT TROPONIN, ED  POCT I-STAT TROPONIN I    EKG EKG Interpretation  Date/Time:  Saturday April 06 2018 13:16:38 EST Ventricular Rate:  94 PR Interval:    QRS Duration: 82 QT Interval:  364 QTC Calculation: 456 R Axis:   -44 Text Interpretation:  Sinus rhythm Left anterior fascicular block Left ventricular hypertrophy Anterior Q waves, possibly due to LVH Baseline wander in lead(s) V6 No significant change was found since last tracing no significant change Reconfirmed by Malvin Johns [phone] on 04/06/2018 4:17:46  PM   Radiology Dg Chest 2 View  Result Date: 04/06/2018 CLINICAL DATA:  Headache, chest pain and generalized body aches since yesterday, hypertension, GERD EXAM: CHEST - 2 VIEW COMPARISON:  04/24/2013 FINDINGS: Enlargement of cardiac silhouette. Prominent RIGHT paratracheal soft tissues, minimally increased since previous exam. Additionally, AP window enlargement question adenopathy versus versus suprahilar mass identified. Mild RIGHT basilar atelectasis. No pleural effusion or pneumothorax. IMPRESSION: Enlargement of cardiac silhouette with question new AP window adenopathy versus adjacent mass; CT chest with contrast recommended to exclude adenopathy/neoplasm. Findings called to Dr. Jeanell Sparrow On 04/06/2018 at 1432  hours. Electronically Signed   By: Lavonia Dana M.D.   On: 04/06/2018 14:34   Ct Angio Chest Pe W/cm &/or Wo Cm  Result Date: 04/06/2018 CLINICAL DATA:  Chest pain.  Abnormal chest radiograph EXAM: CT ANGIOGRAPHY CHEST WITH CONTRAST TECHNIQUE: Multidetector CT imaging of the chest was performed using the standard protocol during bolus administration of intravenous contrast. Multiplanar CT image reconstructions and MIPs were obtained to evaluate the vascular anatomy. CONTRAST:  186mL ISOVUE-370 IOPAMIDOL (ISOVUE-370) INJECTION 76% COMPARISON:  Radiograph 04/06/2018 FINDINGS: Cardiovascular: No filling defects within the pulmonary arteries to suggest acute pulmonary embolism. No acute findings of the aorta or great vessels. No pericardial fluid. Small pericardial effusion measures 8 mm along the LEFT cardiac border. Mediastinum/Nodes: There is prominent mediastinal adenopathy. Prevascular lymph nodes are most prominent and measure up to 2.8 cm short axis. The prevascular adenopathy extends into the LEFT upper lobe (image 73/7). And anterior mediastinum node measures 3 cm. 3.0 cm node. RIGHT hilar node measures 2.4 cm. RIGHT supraclavicular node measures 3.1 cm. LEFT supraclavicular node measures 2.6  cm. No axillary adenopathy. Lungs/Pleura: The prevascular adenopathy extends the LEFT upper lobe but favored to originate the mediastinum however could not exclude a pulmonary lesion. LEFT lower lobe and RIGHT lung are clear. Upper Abdomen: Spleen is normal volume. No upper abdominal adenopathy. Musculoskeletal: No aggressive osseous lesion. Review of the MIP images confirms the above findings. IMPRESSION: 1. Bulky prevascular mediastinal adenopathy and supraclavicular adenopathy. Adenopathy extends into the LEFT upper lobe. Differential would include lymphoma, aggressive thymoma, granulomatous disease (sarcoid) and bronchogenic carcinoma. Recommend clinical correlation with lymphoproliferative disorder and consider tissue sampling. FDG PET scan may be beneficial for biopsy planning and staging. 2. Supraclavicular lymph nodes are assessable for tissue sampling. 3. No evidence of adenopathy in the upper abdomen.  Normal spleen. 4. No evidence acute pulmonary embolism. Electronically Signed   By: Suzy Bouchard M.D.   On: 04/06/2018 18:01    Procedures Procedures (including critical care time)  Medications Ordered in ED Medications  sodium chloride (PF) 0.9 % injection (has no administration in time range)  iopamidol (ISOVUE-370) 76 % injection (has no administration in time range)  acetaminophen (TYLENOL) tablet 1,000 mg (1,000 mg Oral Given 04/06/18 1702)  iopamidol (ISOVUE-370) 76 % injection 100 mL (100 mLs Intravenous Contrast Given 04/06/18 1718)     Initial Impression / Assessment and Plan / ED Course  I have reviewed the triage vital signs and the nursing notes.  Pertinent labs & imaging results that were available during my care of the patient were reviewed by me and considered in my medical decision making (see chart for details).     Patient is a 57 year old female who presents with chest pain.  Her chest pain is reproducible.  She has no significant associated symptoms.  She has  some mild shortness of breath but no symptoms currently.  No hypoxia.  Her chest x-ray showed some concerning enlargement of her mediastinum.  Follow-up CT scan shows multiple enlarged lymph nodes.  It is recommended that she get a PET scan and possible follow-up biopsy.  I discussed this with the patient.  She currently is followed by the abdomen splint clinic.  She does state that she can go back there but she has insurance is going to start January 1 and she is interested in trying to get an appointment with the internal medicine clinic associated [place].  I did advise her that she needs to have close follow-up to start  the process of evaluation of this.  She is in acknowledgment of this.  She also has not been taking her blood pressure medicine for the last month.  She ran out.  I gave her a refill on this and advised her that she needs to start taking it tonight.  Her chest pain is atypical.  She has a low heart score.  She has no evidence of PE on CT.  No pneumothorax or pneumonia.  She was discharged home in good condition.  She has had 2- troponins.  She will follow-up with her PCP.  Return precautions were given.  Final Clinical Impressions(s) / ED Diagnoses   Final diagnoses:  Essential hypertension  Chest mass  Atypical chest pain    ED Discharge Orders         Ordered    losartan-hydrochlorothiazide (HYZAAR) 100-12.5 MG tablet  Daily     04/06/18 1946           Malvin Johns, MD 04/06/18 1950

## 2018-04-06 NOTE — ED Notes (Signed)
Pt and family verbalized discharge instructions and follow up care. Ambulatory and alert. Daughter is driving home.

## 2018-04-06 NOTE — ED Provider Notes (Signed)
Dr. Thornton Papas called with chest x-Samantha Santos results and states mediastinal mass versus adenopathy and patient will need CT scan of chest   Pattricia Boss, MD 04/06/18 1434

## 2018-04-08 ENCOUNTER — Encounter: Payer: Self-pay | Admitting: Family Medicine

## 2018-04-08 ENCOUNTER — Ambulatory Visit: Payer: Medicaid Other | Attending: Family Medicine | Admitting: Family Medicine

## 2018-04-08 VITALS — BP 151/109 | HR 93 | Temp 98.6°F | Resp 18 | Ht 67.0 in | Wt 293.0 lb

## 2018-04-08 DIAGNOSIS — R9389 Abnormal findings on diagnostic imaging of other specified body structures: Secondary | ICD-10-CM | POA: Insufficient documentation

## 2018-04-08 DIAGNOSIS — R590 Localized enlarged lymph nodes: Secondary | ICD-10-CM

## 2018-04-08 DIAGNOSIS — I10 Essential (primary) hypertension: Secondary | ICD-10-CM | POA: Diagnosis not present

## 2018-04-08 DIAGNOSIS — E66813 Obesity, class 3: Secondary | ICD-10-CM

## 2018-04-08 DIAGNOSIS — K219 Gastro-esophageal reflux disease without esophagitis: Secondary | ICD-10-CM | POA: Insufficient documentation

## 2018-04-08 DIAGNOSIS — Z6841 Body Mass Index (BMI) 40.0 and over, adult: Secondary | ICD-10-CM | POA: Diagnosis not present

## 2018-04-08 DIAGNOSIS — Z8249 Family history of ischemic heart disease and other diseases of the circulatory system: Secondary | ICD-10-CM | POA: Diagnosis not present

## 2018-04-08 DIAGNOSIS — Z880 Allergy status to penicillin: Secondary | ICD-10-CM | POA: Insufficient documentation

## 2018-04-08 DIAGNOSIS — Z791 Long term (current) use of non-steroidal anti-inflammatories (NSAID): Secondary | ICD-10-CM | POA: Insufficient documentation

## 2018-04-08 DIAGNOSIS — Z833 Family history of diabetes mellitus: Secondary | ICD-10-CM | POA: Insufficient documentation

## 2018-04-08 DIAGNOSIS — R0789 Other chest pain: Secondary | ICD-10-CM | POA: Insufficient documentation

## 2018-04-08 DIAGNOSIS — Z79899 Other long term (current) drug therapy: Secondary | ICD-10-CM | POA: Diagnosis not present

## 2018-04-08 MED ORDER — OLMESARTAN-AMLODIPINE-HCTZ 40-10-25 MG PO TABS: 1.0000 | ORAL_TABLET | Freq: Every day | ORAL | 4 refills | Status: DC

## 2018-04-08 MED ORDER — LOSARTAN POTASSIUM-HCTZ 100-12.5 MG PO TABS: 1.0000 | ORAL_TABLET | Freq: Every day | ORAL | 4 refills | Status: DC

## 2018-04-08 NOTE — Progress Notes (Signed)
Subjective:    Patient ID: Samantha Santos, female    DOB: 1961-02-07, 57 y.o.   MRN: [id number]  HPI        57 year old female who is status post emergency department visit at [facility] on 06/07/2017 secondary to hypertension along with complaint of chest pain and headache.  Patient's blood pressure in the emergency department was 183/128 with a pulse of 90.  Patient had room air oxygen saturation of 95%.  Patient had chest x-ray showing enlargement of the mediastinum with CT scan showing multiple enlarged lymph nodes.  Patient will need further follow-up.  Patient also given a refill of blood pressure medications that she had not been taking these over the past month.  On chart review, patient also with ED visit on 12/26/2016 and patient had presence of right supraclavicular adenopathy at that time.  At that visit, patient was treated with ibuprofen for pain.       Patient reports that she has not taken her blood pressure medication yet today as she normally takes her medication after she has eaten or with a meal to help avoid stomach upset.  Patient states that other than today she has been taking her medication daily since it was recently refilled in the emergency department.  Patient denies any headaches or dizziness related to her blood pressure.  Patient is accompanied by her daughter today's visit and I asked patient and daughter if they were aware of follow-up instructions from patient's recent emergency department visit.  Per daughter, she was told that her mother needs to have a different kind of scan done regarding an abnormal chest CT from the emergency department visit.  Patient states that this is why she is here to establish with a PCP.  Patient reports that she has had several weeks of pain in the center of her chest as if someone has kicked her in the chest and patient also with pain in her left upper chest that worsens when she leans forward.  Patient reports that when she leans  forward she feels a pulling sensation in her chest.  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       Patient denies any cough but occasionally feels short of breath.  Patient has no history of tobacco use.  Patient reports no family history of malignancy.  Patient reports family history of mother with diabetes, hypertension, heart disease and stroke.  Patient states that her own past medical history has only been significant for hypertension and acid reflux.  Patient states that she has not been on acid reflux medicine in several years and does not currently feel as if her symptoms are related to acid reflux.  Patient denies any night sweats, no loss of appetite and no unexplained weight loss.  Patient reports that she did have an episode last week in which she felt really bad while at work but was told that she could not leave work because they were already [employer].  Patient has felt as if she is more fatigued recently.  Patient denies any breast pain and states that her mammogram is up-to-date. Past Medical History:  Diagnosis Date  . Anemia   . GERD (gastroesophageal reflux disease)   . Hypertension  Past Surgical History:  Procedure Laterality Date  . CESAREAN SECTION  1988  . HYSTEROSCOPY W/D&C  06/14/2011   Procedure: DILATATION AND CURETTAGE /HYSTEROSCOPY;  Surgeon: Frederico Hamman, MD;  Location: [facility];  Service: Gynecology;  Laterality: N/A;   Family History  Problem Relation Age of Onset  . Diabetes Mother   . Hypertension Mother   . CAD Mother   . CVA Mother    Social History   Tobacco Use  . Smoking status: Never Smoker  . Smokeless tobacco: Never Used  Substance Use Topics  . Alcohol use: No  . Drug use: No   Allergies  Allergen  Reactions  . Penicillins     syncope     Review of Systems  Constitutional: Positive for fatigue. Negative for activity change, appetite change, chills, diaphoresis, fever and unexpected weight change.  HENT: Positive for congestion. Negative for ear pain, nosebleeds, sinus pressure, sinus pain, sore throat and trouble swallowing.   Respiratory: Positive for shortness of breath. Negative for cough.   Cardiovascular: Positive for chest pain. Negative for palpitations and leg swelling.  Gastrointestinal: Negative for abdominal pain, blood in stool, constipation, diarrhea and nausea.  Genitourinary: Negative for dysuria, flank pain and frequency.  Musculoskeletal: Positive for arthralgias. Negative for back pain, gait problem and joint swelling.  Neurological: Negative for dizziness, numbness and headaches.  Hematological: Positive for adenopathy. Does not bruise/bleed easily.       Objective:   Physical Exam BP (!) 151/109 (BP Location: Right Arm, Patient Position: Sitting, Cuff Size: Large)   Pulse 93   Temp 98.6 F (37 C) (Oral)   Resp 18   Ht 5\' 7"  (1.702 m)   Wt 293 lb (132.9 kg)   SpO2 97%   BMI 45.89 kg/m Nurses note and vital signs reviewed General-obese older female in no acute distress.  Patient is sitting on the exam table.  Patient is accompanied by her daughter at today's visit. ENT-TMs dull, nares with mild edema of the nasal turbinates, patient with mild posterior pharynx erythema Neck-supple, no lymphadenopathy, no thyromegaly, no carotid bruit Chest- patient with some tenderness to palpation in the left upper mid chest and has increased discomfort in this area when she attempts to lift her arm overhead.  Patient does have some adenopathy in the left lower axilla.  Patient has fullness but no palpable adenopathy in the left supraclavicular area.  No palpable right supraclavicular lymphadenopathy. Lungs- clear to auscultation bilaterally with mild decreased breath  sounds Abdomen-truncal obesity, soft and nontender Extremities- patient with bilateral nonpitting distal lower extremity edema-chronic per patient      Assessment & Plan:  1. Chest pain, atypical Patient is status post emergency department visit on 04/06/2018 secondary to left-sided chest and central chest pain.  Patient had CT scan of the chest which showed prominent mediastinal adenopathy along with prevascular lymph nodes which were prominent and measure up to 2.8 cm and that this adenopathy extended into the left upper lobe.  Patient also with a right supraclavicular node of 3.1 cm in the left supraclavicular node of 2.6 cm.  Differential includes lymphoma aggressive thymoma, granulomatous disease/sarcoid and bronchogenic carcinoma.  Clinical correlation with lymphoproliferative disorder and consider tissue sampling and PET scan may be beneficial for biopsy planning and staging.  Supraclavicular lymph nodes are assessable for tissue sampling.  Based on the CT scan results patient is being referred to CT surgery for further evaluation including possible tissue biopsy.  Patient has denied any night sweats  or loss of appetite and no cough.  Patient did have a decrease in oxygen saturation at 95% during the emergency department visit but oxygen saturation is 97% and patient reports no acute cough or shortness of breath today.  Patient will also be referred to oncology for further evaluation but hopefully CT surgery will be able to obtain biopsy to differentiate between malignancy versus autoimmune disorder/granulomatous disease such as sarcoidosis to help better direct treatment path.  Patient will also have CBC and BMP at today's visit - Ambulatory referral to Cardiothoracic Surgery - Ambulatory referral to Oncology - CBC with Differential - Basic Metabolic Panel  2. Abnormal CT lung scan See above, patient with bulky prevascular mediastinal adenopathy and supraclavicular adenopathy with extension  into the left upper lobe on CT scan done on 04/06/2018.  Patient is being referred to cardiothoracic surgery and oncology.   - Ambulatory referral to Cardiothoracic Surgery - Ambulatory referral to Oncology - CBC with Differential - Basic Metabolic Panel  3. Essential hypertension/4. long-term use of medication Patient with hypertension.  Patient has not taken her medications prior to today's visit as she normally takes her blood pressure medicine with her after a meal and patient is fasting at today's visit.  Patient will have BMP and lipid panel in follow-up of hypertension as well as follow-up of long-term use of medications.  Medication refills were sent to patient's pharmacy - Lipid panel -bmp  5.  Morbid obesity, BMI 45 Low-fat reduced carbohydrate diet and exercise encouraged but realistically, patient may not be able to begin this program until she has had further evaluation for abnormal chest CT.  Dash type diet to help with hypertension.  An After Visit Summary was printed and given to the patient.  Allergies as of 04/08/2018      Reactions   Penicillins    syncope      Medication List       Accurate as of April 08, 2018 12:54 PM. Always use your most recent med list.        losartan-hydrochlorothiazide 100-12.5 MG tablet Commonly known as:  HYZAAR Take 1 tablet by mouth daily. To lower blood pressure   methocarbamol 500 MG tablet Commonly known as:  ROBAXIN Take 1 tablet (500 mg total) by mouth every 8 (eight) hours as needed for muscle spasms.   naproxen 500 MG tablet Commonly known as:  NAPROSYN Take 1 tablet (500 mg total) by mouth 2 (two) times daily.   Olmesartan-amLODIPine-HCTZ 40-10-25 MG Tabs Commonly known as:  TRIBENZOR Take 1 tablet by mouth daily. To lower blood pressure   ondansetron 4 MG tablet Commonly known as:  ZOFRAN Take 1 tablet (4 mg total) by mouth every 6 (six) hours.   pantoprazole 40 MG tablet Commonly known as:  PROTONIX Take  40 mg by mouth daily.      Return in about 6 weeks (around 05/20/2018) for HTN.

## 2018-04-08 NOTE — Progress Notes (Signed)
Hospital follow up?

## 2018-04-09 LAB — CBC WITH DIFFERENTIAL/PLATELET
Basophils Absolute: 0 x10E3/uL (ref 0.0–0.2)
Basos: 0 %
EOS (ABSOLUTE): 0.1 x10E3/uL (ref 0.0–0.4)
Eos: 2 %
Hematocrit: 39.2 % (ref 34.0–46.6)
Hemoglobin: 12.7 g/dL (ref 11.1–15.9)
Immature Grans (Abs): 0 x10E3/uL (ref 0.0–0.1)
Immature Granulocytes: 0 %
Lymphocytes Absolute: 1.8 x10E3/uL (ref 0.7–3.1)
Lymphs: 30 %
MCH: 25.5 pg — ABNORMAL LOW (ref 26.6–33.0)
MCHC: 32.4 g/dL (ref 31.5–35.7)
MCV: 79 fL (ref 79–97)
Monocytes Absolute: 0.4 x10E3/uL (ref 0.1–0.9)
Monocytes: 6 %
Neutrophils Absolute: 3.6 x10E3/uL (ref 1.4–7.0)
Neutrophils: 62 %
Platelets: 208 x10E3/uL (ref 150–450)
RBC: 4.98 x10E6/uL (ref 3.77–5.28)
RDW: 16.1 % — ABNORMAL HIGH (ref 12.3–15.4)
WBC: 5.9 x10E3/uL (ref 3.4–10.8)

## 2018-04-09 LAB — BASIC METABOLIC PANEL WITH GFR
BUN/Creatinine Ratio: 10 (ref 9–23)
BUN: 10 mg/dL (ref 6–24)
CO2: 25 mmol/L (ref 20–29)
Calcium: 9.3 mg/dL (ref 8.7–10.2)
Chloride: 106 mmol/L (ref 96–106)
Creatinine, Ser: 1.02 mg/dL — ABNORMAL HIGH (ref 0.57–1.00)
GFR calc Af Amer: 71 mL/min/1.73
GFR calc non Af Amer: 61 mL/min/1.73
Glucose: 95 mg/dL (ref 65–99)
Potassium: 3.8 mmol/L (ref 3.5–5.2)
Sodium: 144 mmol/L (ref 134–144)

## 2018-04-09 LAB — LIPID PANEL
Chol/HDL Ratio: 2.2 ratio (ref 0.0–4.4)
Cholesterol, Total: 132 mg/dL (ref 100–199)
HDL: 61 mg/dL
LDL Calculated: 58 mg/dL (ref 0–99)
Triglycerides: 64 mg/dL (ref 0–149)
VLDL Cholesterol Cal: 13 mg/dL (ref 5–40)

## 2018-04-11 ENCOUNTER — Telehealth: Payer: Self-pay | Admitting: *Deleted

## 2018-04-11 NOTE — Telephone Encounter (Signed)
Patient verified DOB Patient is aware of CBC and Cholesterol being normal. Patient is aware of avoiding NSAIDS and remain hydrated to aid in kidney function. No further questions.

## 2018-04-11 NOTE — Telephone Encounter (Signed)
-----   Message from [name], MD sent at 04/10/2018 11:32 PM EST ----- Notify patient that her lipid panel was normal as well as her CBC. She had a very slight increase in her  Creatinine at 1.02 (normal 0.57-1.00) so she should remain hydrated and limit NSAID use, continue to control blood pressure

## 2018-04-19 ENCOUNTER — Ambulatory Visit: Payer: Self-pay

## 2018-04-22 ENCOUNTER — Other Ambulatory Visit: Payer: Self-pay | Admitting: Family Medicine

## 2018-04-23 ENCOUNTER — Other Ambulatory Visit: Payer: Self-pay | Admitting: Family Medicine

## 2018-04-23 DIAGNOSIS — R9389 Abnormal findings on diagnostic imaging of other specified body structures: Secondary | ICD-10-CM

## 2018-04-23 DIAGNOSIS — R590 Localized enlarged lymph nodes: Secondary | ICD-10-CM

## 2018-04-23 NOTE — Progress Notes (Signed)
Patient ID: Samantha Santos, female   DOB: 1961-01-22, 58 y.o.   MRN: [id number]   Spoke with radiologist and order placed stat for PET scan initial from skull to thigh.  Will  try to have test scheduled as soon as possible as CT surgery will not see patient prior to PET scan.

## 2018-04-25 ENCOUNTER — Telehealth: Payer: Self-pay | Admitting: Family Medicine

## 2018-04-25 NOTE — Telephone Encounter (Signed)
Called pt informed of appointment for PET scan at [facility] on 05/02/18 arrive 3:30 for 4:00.  Pt advised fasting for six hours prior. Pt verbalized understanding and confirmed she has transportation and no other barriers.

## 2018-05-02 ENCOUNTER — Ambulatory Visit (HOSPITAL_COMMUNITY)
Admission: RE | Admit: 2018-05-02 | Discharge: 2018-05-02 | Disposition: A | Discharge status: Home / Self Care | Payer: PRIVATE HEALTH INSURANCE | Source: Ambulatory Visit | Attending: Family Medicine | Admitting: Family Medicine

## 2018-05-02 DIAGNOSIS — R9389 Abnormal findings on diagnostic imaging of other specified body structures: Secondary | ICD-10-CM | POA: Diagnosis not present

## 2018-05-02 DIAGNOSIS — R590 Localized enlarged lymph nodes: Secondary | ICD-10-CM | POA: Insufficient documentation

## 2018-05-02 LAB — GLUCOSE, CAPILLARY: Glucose-Capillary: 119 mg/dL — ABNORMAL HIGH (ref 70–99)

## 2018-05-02 MED ORDER — FLUDEOXYGLUCOSE F - 18 (FDG) INJECTION
15.0000 | Freq: Once | INTRAVENOUS | Status: AC | PRN
  Administered 2018-05-02: 15 via INTRAVENOUS

## 2018-05-06 ENCOUNTER — Telehealth: Payer: Self-pay | Admitting: Family Medicine

## 2018-05-06 ENCOUNTER — Encounter: Payer: Self-pay | Admitting: Thoracic Surgery (Cardiothoracic Vascular Surgery)

## 2018-05-06 ENCOUNTER — Other Ambulatory Visit: Payer: Self-pay

## 2018-05-06 ENCOUNTER — Institutional Professional Consult (permissible substitution) (INDEPENDENT_AMBULATORY_CARE_PROVIDER_SITE_OTHER): Payer: PRIVATE HEALTH INSURANCE | Admitting: Thoracic Surgery (Cardiothoracic Vascular Surgery)

## 2018-05-06 ENCOUNTER — Other Ambulatory Visit: Payer: Self-pay | Admitting: *Deleted

## 2018-05-06 VITALS — BP 120/76 | HR 90 | Resp 16 | Ht 67.0 in | Wt 285.0 lb

## 2018-05-06 DIAGNOSIS — R918 Other nonspecific abnormal finding of lung field: Secondary | ICD-10-CM

## 2018-05-06 DIAGNOSIS — R590 Localized enlarged lymph nodes: Secondary | ICD-10-CM

## 2018-05-06 DIAGNOSIS — R591 Generalized enlarged lymph nodes: Secondary | ICD-10-CM

## 2018-05-06 DIAGNOSIS — R599 Enlarged lymph nodes, unspecified: Secondary | ICD-10-CM

## 2018-05-06 HISTORY — DX: Morbid (severe) obesity due to excess calories: E66.01

## 2018-05-06 NOTE — Telephone Encounter (Signed)
Patient was contacted in follow-up of a recent abnormal PET scan. Patient was made aware that her PET scan was abnormal and that it is very important that she keep her appointment scheduled for later today with CT surgery so that they can review the results with her and make a treatment plan. Patient upon questioning reported no chest pain or pressure and no SOB. 

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
## 2018-05-08 ENCOUNTER — Telehealth: Payer: Self-pay | Admitting: *Deleted

## 2018-05-08 NOTE — Telephone Encounter (Signed)
Patient verified DOB Patient is aware of results and plans to follow through with 05/10/18 biopsy. No further questions.

## 2018-05-09 ENCOUNTER — Other Ambulatory Visit: Payer: Self-pay

## 2018-05-09 ENCOUNTER — Encounter (HOSPITAL_COMMUNITY): Payer: Self-pay | Admitting: *Deleted

## 2018-05-09 NOTE — Progress Notes (Signed)
Pt denies any acute cardiopulmonary issues. Pt denies being under the care of a cardiologist. Pt denies having a stress test, echo and cardiac cath. Pt made aware to stop taking vitamins, fish oil and herbal medications. Do not take any NSAIDs ie: Ibuprofen, Advil, Naproxen (Aleve), Motrin, BC and Goody Powder. Pt verbalized understanding of all pre-op instructions.

## 2018-05-10 ENCOUNTER — Ambulatory Visit (HOSPITAL_COMMUNITY)
Admission: RE | Admit: 2018-05-10 | Discharge: 2018-05-10 | Disposition: A | Discharge status: Home / Self Care | Payer: PRIVATE HEALTH INSURANCE | Source: Ambulatory Visit | Attending: Thoracic Surgery (Cardiothoracic Vascular Surgery) | Admitting: Thoracic Surgery (Cardiothoracic Vascular Surgery)

## 2018-05-10 ENCOUNTER — Ambulatory Visit (HOSPITAL_COMMUNITY): Payer: PRIVATE HEALTH INSURANCE | Admitting: Physician Assistant

## 2018-05-10 ENCOUNTER — Encounter (HOSPITAL_COMMUNITY): Payer: Self-pay

## 2018-05-10 ENCOUNTER — Encounter (HOSPITAL_COMMUNITY)
Admission: RE | Disposition: A | Discharge status: Home / Self Care | Payer: Self-pay | Source: Ambulatory Visit | Attending: Thoracic Surgery (Cardiothoracic Vascular Surgery)

## 2018-05-10 ENCOUNTER — Ambulatory Visit (HOSPITAL_COMMUNITY): Payer: PRIVATE HEALTH INSURANCE

## 2018-05-10 DIAGNOSIS — C969 Malignant neoplasm of lymphoid, hematopoietic and related tissue, unspecified: Secondary | ICD-10-CM | POA: Insufficient documentation

## 2018-05-10 DIAGNOSIS — Z6841 Body Mass Index (BMI) 40.0 and over, adult: Secondary | ICD-10-CM | POA: Insufficient documentation

## 2018-05-10 DIAGNOSIS — R590 Localized enlarged lymph nodes: Secondary | ICD-10-CM

## 2018-05-10 DIAGNOSIS — Z791 Long term (current) use of non-steroidal anti-inflammatories (NSAID): Secondary | ICD-10-CM | POA: Insufficient documentation

## 2018-05-10 DIAGNOSIS — Z79899 Other long term (current) drug therapy: Secondary | ICD-10-CM | POA: Insufficient documentation

## 2018-05-10 DIAGNOSIS — K219 Gastro-esophageal reflux disease without esophagitis: Secondary | ICD-10-CM | POA: Insufficient documentation

## 2018-05-10 DIAGNOSIS — I10 Essential (primary) hypertension: Secondary | ICD-10-CM | POA: Diagnosis not present

## 2018-05-10 DIAGNOSIS — R918 Other nonspecific abnormal finding of lung field: Secondary | ICD-10-CM

## 2018-05-10 HISTORY — DX: Localized enlarged lymph nodes: R59.0

## 2018-05-10 HISTORY — PX: SUPRACLAVICAL NODE BIOPSY: SHX5165

## 2018-05-10 HISTORY — DX: Unspecified osteoarthritis, unspecified site: M19.90

## 2018-05-10 LAB — COMPREHENSIVE METABOLIC PANEL
ALT: 19 U/L (ref 0–44)
AST: 23 U/L (ref 15–41)
Albumin: 3.3 g/dL — ABNORMAL LOW (ref 3.5–5.0)
Alkaline Phosphatase: 88 U/L (ref 38–126)
Anion gap: 9 (ref 5–15)
BUN: 15 mg/dL (ref 6–20)
CO2: 26 mmol/L (ref 22–32)
Calcium: 9 mg/dL (ref 8.9–10.3)
Chloride: 104 mmol/L (ref 98–111)
Creatinine, Ser: 1.01 mg/dL — ABNORMAL HIGH (ref 0.44–1.00)
GFR calc Af Amer: >60 mL/min (ref >60)
GFR calc non Af Amer: >60 mL/min (ref >60)
Glucose, Bld: 101 mg/dL — ABNORMAL HIGH (ref 70–99)
POTASSIUM: 3.7 mmol/L (ref 3.5–5.1)
Sodium: 139 mmol/L (ref 135–145)
Total Bilirubin: 1.7 mg/dL — ABNORMAL HIGH (ref 0.3–1.2)
Total Protein: 7.8 g/dL (ref 6.5–8.1)

## 2018-05-10 LAB — CBC
HCT: 37.9 % (ref 36.0–46.0)
Hemoglobin: 11.7 g/dL — ABNORMAL LOW (ref 12.0–15.0)
MCH: 24.1 pg — ABNORMAL LOW (ref 26.0–34.0)
MCHC: 30.9 g/dL (ref 30.0–36.0)
MCV: 78 fL — ABNORMAL LOW (ref 80.0–100.0)
Platelets: 129 K/uL — ABNORMAL LOW (ref 150–400)
RBC: 4.86 MIL/uL (ref 3.87–5.11)
RDW: 15.9 % — ABNORMAL HIGH (ref 11.5–15.5)
WBC: 5.2 K/uL (ref 4.0–10.5)
nRBC: 0 % (ref 0.0–0.2)

## 2018-05-10 LAB — PROTIME-INR
INR: 1.05
Prothrombin Time: 13.6 s (ref 11.4–15.2)

## 2018-05-10 LAB — APTT: aPTT: 30 seconds (ref 24–36)

## 2018-05-10 SURGERY — BIOPSY, LYMPH NODE, SUPRACLAVICULAR
Anesthesia: General | Laterality: Right

## 2018-05-10 MED ORDER — GLYCOPYRROLATE PF 0.2 MG/ML IJ SOSY
PREFILLED_SYRINGE | INTRAMUSCULAR | Status: AC
  Filled 2018-05-10: qty 1

## 2018-05-10 MED ORDER — ONDANSETRON HCL 4 MG/2ML IJ SOLN
INTRAMUSCULAR | Status: AC
  Filled 2018-05-10: qty 2

## 2018-05-10 MED ORDER — VANCOMYCIN HCL 1000 MG IV SOLR
INTRAVENOUS | Status: DC | PRN
  Administered 2018-05-10: 1000 mg via INTRAVENOUS

## 2018-05-10 MED ORDER — DIPHENHYDRAMINE HCL 50 MG/ML IJ SOLN
INTRAMUSCULAR | Status: DC | PRN
  Administered 2018-05-10: 12.5 mg via INTRAVENOUS

## 2018-05-10 MED ORDER — LIDOCAINE 2% (20 MG/ML) 5 ML SYRINGE
INTRAMUSCULAR | Status: DC | PRN
  Administered 2018-05-10: 60 mg via INTRAVENOUS

## 2018-05-10 MED ORDER — MIDAZOLAM HCL 2 MG/2ML IJ SOLN
INTRAMUSCULAR | Status: AC
  Filled 2018-05-10: qty 2

## 2018-05-10 MED ORDER — OXYCODONE HCL 5 MG PO TABS: 5.0000 mg | ORAL_TABLET | Freq: Once | ORAL | Status: DC | PRN

## 2018-05-10 MED ORDER — DEXAMETHASONE SODIUM PHOSPHATE 10 MG/ML IJ SOLN
INTRAMUSCULAR | Status: AC
  Filled 2018-05-10: qty 1

## 2018-05-10 MED ORDER — SUGAMMADEX SODIUM 200 MG/2ML IV SOLN
INTRAVENOUS | Status: DC | PRN
  Administered 2018-05-10: 269.4 mg via INTRAVENOUS

## 2018-05-10 MED ORDER — LIDOCAINE-EPINEPHRINE (PF) 1 %-1:200000 IJ SOLN
INTRAMUSCULAR | Status: AC
  Filled 2018-05-10: qty 30

## 2018-05-10 MED ORDER — SODIUM CHLORIDE 0.9% FLUSH: 3.0000 mL | Freq: Two times a day (BID) | INTRAVENOUS | Status: DC

## 2018-05-10 MED ORDER — PROPOFOL 10 MG/ML IV BOLUS
INTRAVENOUS | Status: DC | PRN
  Administered 2018-05-10: 170 mg via INTRAVENOUS

## 2018-05-10 MED ORDER — LACTATED RINGERS IV SOLN
INTRAVENOUS | Status: DC
  Administered 2018-05-10: 08:00:00 via INTRAVENOUS

## 2018-05-10 MED ORDER — PHENYLEPHRINE 40 MCG/ML (10ML) SYRINGE FOR IV PUSH (FOR BLOOD PRESSURE SUPPORT)
PREFILLED_SYRINGE | INTRAVENOUS | Status: AC
  Filled 2018-05-10: qty 10

## 2018-05-10 MED ORDER — PHENYLEPHRINE HCL 10 MG/ML IJ SOLN
INTRAMUSCULAR | Status: DC | PRN
  Administered 2018-05-10: 80 ug via INTRAVENOUS
  Administered 2018-05-10: 120 ug via INTRAVENOUS
  Administered 2018-05-10: 40 ug via INTRAVENOUS
  Administered 2018-05-10 (×2): 80 ug via INTRAVENOUS

## 2018-05-10 MED ORDER — BUPIVACAINE HCL (PF) 0.5 % IJ SOLN
INTRAMUSCULAR | Status: DC | PRN
  Administered 2018-05-10: 10 mL

## 2018-05-10 MED ORDER — ROCURONIUM BROMIDE 50 MG/5ML IV SOSY
PREFILLED_SYRINGE | INTRAVENOUS | Status: AC
  Filled 2018-05-10: qty 5

## 2018-05-10 MED ORDER — ROCURONIUM BROMIDE 50 MG/5ML IV SOSY
PREFILLED_SYRINGE | INTRAVENOUS | Status: DC | PRN
  Administered 2018-05-10: 50 mg via INTRAVENOUS

## 2018-05-10 MED ORDER — HEMOSTATIC AGENTS (NO CHARGE) OPTIME
TOPICAL | Status: DC | PRN
  Administered 2018-05-10: 1 via TOPICAL

## 2018-05-10 MED ORDER — LIDOCAINE-EPINEPHRINE (PF) 1 %-1:200000 IJ SOLN
INTRAMUSCULAR | Status: DC | PRN
  Administered 2018-05-10: 30 mL

## 2018-05-10 MED ORDER — 0.9 % SODIUM CHLORIDE (POUR BTL) OPTIME
TOPICAL | Status: DC | PRN
  Administered 2018-05-10: 1000 mL

## 2018-05-10 MED ORDER — ONDANSETRON HCL 4 MG/2ML IJ SOLN: 4.0000 mg | Freq: Once | INTRAMUSCULAR | Status: DC | PRN

## 2018-05-10 MED ORDER — ONDANSETRON HCL 4 MG/2ML IJ SOLN
INTRAMUSCULAR | Status: DC | PRN
  Administered 2018-05-10: 4 mg via INTRAVENOUS

## 2018-05-10 MED ORDER — FENTANYL CITRATE (PF) 100 MCG/2ML IJ SOLN: 25.0000 ug | INTRAMUSCULAR | Status: DC | PRN

## 2018-05-10 MED ORDER — SODIUM CHLORIDE 0.9% FLUSH: 3.0000 mL | INTRAVENOUS | Status: DC | PRN

## 2018-05-10 MED ORDER — LIDOCAINE 2% (20 MG/ML) 5 ML SYRINGE
INTRAMUSCULAR | Status: AC
  Filled 2018-05-10: qty 5

## 2018-05-10 MED ORDER — SODIUM CHLORIDE 0.9 % IV SOLN: 250.0000 mL | INTRAVENOUS | Status: DC | PRN

## 2018-05-10 MED ORDER — OXYCODONE HCL 5 MG PO TABS: 5.0000 mg | ORAL_TABLET | Freq: Four times a day (QID) | ORAL | 0 refills | Status: DC | PRN

## 2018-05-10 MED ORDER — FENTANYL CITRATE (PF) 250 MCG/5ML IJ SOLN
INTRAMUSCULAR | Status: DC | PRN
  Administered 2018-05-10: 50 ug via INTRAVENOUS
  Administered 2018-05-10: 25 ug via INTRAVENOUS
  Administered 2018-05-10: 100 ug via INTRAVENOUS

## 2018-05-10 MED ORDER — DEXAMETHASONE SODIUM PHOSPHATE 10 MG/ML IJ SOLN
INTRAMUSCULAR | Status: DC | PRN
  Administered 2018-05-10: 5 mg via INTRAVENOUS

## 2018-05-10 MED ORDER — DIPHENHYDRAMINE HCL 50 MG/ML IJ SOLN
INTRAMUSCULAR | Status: AC
  Filled 2018-05-10: qty 1

## 2018-05-10 MED ORDER — OXYCODONE HCL 5 MG PO TABS: 5.0000 mg | ORAL_TABLET | Freq: Four times a day (QID) | ORAL | Status: DC | PRN

## 2018-05-10 MED ORDER — LIDOCAINE HCL (PF) 1 % IJ SOLN
INTRAMUSCULAR | Status: AC
  Filled 2018-05-10: qty 30

## 2018-05-10 MED ORDER — PROPOFOL 10 MG/ML IV BOLUS
INTRAVENOUS | Status: AC
  Filled 2018-05-10: qty 20

## 2018-05-10 MED ORDER — BUPIVACAINE HCL (PF) 0.5 % IJ SOLN
INTRAMUSCULAR | Status: AC
  Filled 2018-05-10: qty 10

## 2018-05-10 MED ORDER — MIDAZOLAM HCL 2 MG/2ML IJ SOLN
INTRAMUSCULAR | Status: DC | PRN
  Administered 2018-05-10: 2 mg via INTRAVENOUS

## 2018-05-10 MED ORDER — FENTANYL CITRATE (PF) 250 MCG/5ML IJ SOLN
INTRAMUSCULAR | Status: AC
  Filled 2018-05-10: qty 5

## 2018-05-10 MED ORDER — VANCOMYCIN HCL IN DEXTROSE 1-5 GM/200ML-% IV SOLN
INTRAVENOUS | Status: AC
  Filled 2018-05-10: qty 200

## 2018-05-10 MED ORDER — GLYCOPYRROLATE 0.2 MG/ML IJ SOLN
INTRAMUSCULAR | Status: DC | PRN
  Administered 2018-05-10 (×2): 0.1 mg via INTRAVENOUS

## 2018-05-10 MED ORDER — OXYCODONE HCL 5 MG/5ML PO SOLN: 5.0000 mg | Freq: Once | ORAL | Status: DC | PRN

## 2018-05-10 SURGICAL SUPPLY — 38 items
ADH SKN CLS APL DERMABOND .7 (GAUZE/BANDAGES/DRESSINGS) ×1
CANISTER SUCT 3000ML PPV (MISCELLANEOUS) ×3 IMPLANT
CLIP VESOCCLUDE MED 6/CT (CLIP) ×3 IMPLANT
CLIP VESOCCLUDE SM WIDE 24/CT (CLIP) ×2 IMPLANT
CLIP VESOCCLUDE SM WIDE 6/CT (CLIP) ×3 IMPLANT
CONT SPEC 4OZ CLIKSEAL STRL BL (MISCELLANEOUS) ×8 IMPLANT
COVER SURGICAL LIGHT HANDLE (MISCELLANEOUS) ×6 IMPLANT
COVER WAND RF STERILE (DRAPES) ×3 IMPLANT
DERMABOND ADVANCED (GAUZE/BANDAGES/DRESSINGS) ×2
DERMABOND ADVANCED .7 DNX12 (GAUZE/BANDAGES/DRESSINGS) ×1 IMPLANT
DRAPE CHEST BREAST 15X10 FENES (DRAPES) ×3 IMPLANT
ELECT CAUTERY BLADE 6.4 (BLADE) ×3 IMPLANT
ELECT REM PT RETURN 9FT ADLT (ELECTROSURGICAL) ×3
ELECTRODE REM PT RTRN 9FT ADLT (ELECTROSURGICAL) ×1 IMPLANT
GAUZE SPONGE 4X4 12PLY STRL (GAUZE/BANDAGES/DRESSINGS) ×3 IMPLANT
GLOVE SURG SIGNA 7.5 PF LTX (GLOVE) ×3 IMPLANT
GOWN STRL REUS W/ TWL XL LVL3 (GOWN DISPOSABLE) ×1 IMPLANT
GOWN STRL REUS W/TWL XL LVL3 (GOWN DISPOSABLE) ×3
HEMOSTAT SURGICEL 2X14 (HEMOSTASIS) ×2 IMPLANT
KIT BASIN OR (CUSTOM PROCEDURE TRAY) ×3 IMPLANT
KIT TURNOVER KIT B (KITS) ×3 IMPLANT
NEEDLE 22X1 1/2 (OR ONLY) (NEEDLE) IMPLANT
NS IRRIG 1000ML POUR BTL (IV SOLUTION) ×3 IMPLANT
PACK GENERAL/GYN (CUSTOM PROCEDURE TRAY) ×3 IMPLANT
PAD ARMBOARD 7.5X6 YLW CONV (MISCELLANEOUS) ×6 IMPLANT
SPONGE INTESTINAL PEANUT (DISPOSABLE) ×3 IMPLANT
SUT SILK 2 0 (SUTURE) ×3
SUT SILK 2-0 18XBRD TIE 12 (SUTURE) ×1 IMPLANT
SUT VIC AB 2-0 CT1 27 (SUTURE)
SUT VIC AB 2-0 CT1 TAPERPNT 27 (SUTURE) IMPLANT
SUT VIC AB 3-0 SH 27 (SUTURE) ×3
SUT VIC AB 3-0 SH 27X BRD (SUTURE) ×1 IMPLANT
SUT VIC AB 3-0 X1 27 (SUTURE) IMPLANT
SUT VICRYL 4-0 PS2 18IN ABS (SUTURE) ×3 IMPLANT
SYR CONTROL 10ML LL (SYRINGE) IMPLANT
TOWEL GREEN STERILE (TOWEL DISPOSABLE) ×3 IMPLANT
TOWEL GREEN STERILE FF (TOWEL DISPOSABLE) ×3 IMPLANT
WATER STERILE IRR 1000ML POUR (IV SOLUTION) ×3 IMPLANT

## 2018-05-10 NOTE — Anesthesia Postprocedure Evaluation (Signed)
Anesthesia Post Note  Patient: Samantha Santos  Procedure(s) Performed: SUPRACLAVICAL NODE BIOPSY (Right )     Patient location during evaluation: PACU Anesthesia Type: General Level of consciousness: awake and alert Pain management: pain level controlled Vital Signs Assessment: post-procedure vital signs reviewed and stable Respiratory status: spontaneous breathing, nonlabored ventilation, respiratory function stable and patient connected to nasal cannula oxygen Cardiovascular status: blood pressure returned to baseline and stable Postop Assessment: no apparent nausea or vomiting Anesthetic complications: no    Last Vitals:  Vitals:   05/10/18 1304 05/10/18 1319  BP: 114/90 (!) 112/94  Pulse:  88  Resp: (!) 22 (!) 21  Temp:    SpO2: 97% 97%    Last Pain:  Vitals:   05/10/18 1304  TempSrc:   PainSc: 0-No pain                 Yides Saidi COKER

## 2018-05-10 NOTE — Discharge Instructions (Signed)
Do not drive or engage in heavy physical activity for 24 hours.  You may resume normal activities and shower tomorrow.  You have a prescription for oxycodone, a narcotic pain reliever. You may use as directed.  You may use acetaminophen (Tylenol) or ibuprofen (Advil, Motrin) in addition to or instead of the oxycodone.  You may apply an ice pack for 30 minutes 4 times a day if needed.  There is a medical adhesive over the incision. It will begin to peel off in about 2 weeks.  Call [phone] for a follow up appointment next week (Thrsday or Friday)  Call [phone] if you develop excessive pain, swelling, or redness at the incision or notice drainage from the incision.

## 2018-05-10 NOTE — Brief Op Note (Signed)
05/10/2018  3:39 PM  PATIENT:  Samantha Santos  58 y.o. female  PRE-OPERATIVE DIAGNOSIS:  SUPRACLAVICULAR ADENOPATHY  POST-OPERATIVE DIAGNOSIS:  SUPRACLAVICULAR ADENOPATHY  PROCEDURE:  Procedure(s): SUPRACLAVICAL NODE BIOPSY (Right)  SURGEON:  Surgeon(s) and Role:    * [name], MD - Primary  PHYSICIAN ASSISTANT:   ASSISTANTS: none   ANESTHESIA:   general  EBL:   < 10 ml  BLOOD ADMINISTERED:none  DRAINS: none   LOCAL MEDICATIONS USED:  MARCAINE    and Amount: 10 ml  SPECIMEN:  Source of Specimen:  lymph node  DISPOSITION OF SPECIMEN:  PATHOLOGY  COUNTS:  YES  TOURNIQUET:  * No tourniquets in log *  DICTATION: .Other Dictation: Dictation Number -  PLAN OF CARE: Discharge to home after PACU  PATIENT DISPOSITION:  PACU - hemodynamically stable.   Delay start of Pharmacological VTE agent (>24hrs) due to surgical blood loss or risk of bleeding: not applicable

## 2018-05-10 NOTE — Op Note (Signed)
Samantha Santos, Samantha Santos MEDICAL RECORD [id number] ACCOUNT [id number] DATE OF BIRTH:[birth year] FACILITY: [facility] LOCATION: [facility] PHYSICIAN:Wednesday Ericsson Chaya Jan, MD  OPERATIVE REPORT  DATE OF PROCEDURE:  05/10/2018  PREOPERATIVE DIAGNOSIS:  Supraclavicular adenopathy.  POSTOPERATIVE DIAGNOSIS:  Supraclavicular adenopathy.  PROCEDURE:  Right supraclavicular lymph node biopsy.  SURGEON:  Modesto Charon, MD  ASSISTANT:  None.  ANESTHESIA:  General.  FINDINGS:  Markedly enlarged, firm right supraclavicular lymph node with some central necrosis.  Frozen section showed tumor, but definitive diagnosis will await permanent pathology.  CLINICAL NOTE:  The patient is a 58 year old woman with a history of obesity, hypertension, reflux and anemia.  She presented with chest pain after a mild trauma.  She was found to have mediastinal adenopathy.  CT of the chest and PET CT showed massive  supraclavicular and mediastinal adenopathy.  On PET the lymph nodes were hypermetabolic.  She was referred for surgical biopsy for definitive diagnosis in order to guide therapy.  The indications, risks, benefits, and alternatives were discussed in  detail with the patient.  She understood and accepted the risks and agreed to proceed.  DESCRIPTION OF PROCEDURE:  The patient was brought to the operating room on 05/10/2018.  She had induction of general anesthesia and was intubated at her request.  Intravenous antibiotics were administered.  Sequential compression devices were placed on  the calves for DVT prophylaxis.  The right neck and upper chest were prepped and draped in the usual sterile fashion.  A timeout was performed.  An incision was made in the right lower neck region that was carried through the skin and subcutaneous tissue.  The platysma was divided.  There was a large lymph node approximately 3 cm in diameter.  The node was freed from surrounding tissues superiorly and laterally.  Feeding  blood vessels were clipped and divided.  A portion of the node was removed and sent for frozen section.  While awaiting that result, additional dissection was carried out.  It was clear that medially and inferiorly it was going to be very difficult to  remove the entire node.  Therefore, the majority of the node was removed and it was divided with electrocautery.  This larger portion of the lymph node was sent for permanent pathology.  The wound was copiously irrigated with warm saline.  Hemostasis was  achieved.  A small piece of Surgicel was left over the cut surface of the lymph node.  The frozen section returned showing a malignancy.  Further testing will need to be done to determine carcinoma versus lymphoma.  The incision was closed in 2 layers  with a running 3-0 Vicryl suture in the platysmal layer followed by running 4-0 Vicryl subcuticular suture.  Dermabond was applied.  The patient was extubated in the operating room and taken to the [facility] in good condition.  TN/NUANCE  D:05/10/2018 T:05/10/2018 JOB:[id number]

## 2018-05-10 NOTE — Anesthesia Procedure Notes (Signed)
Procedure Name: Intubation Date/Time: 05/10/2018 10:14 AM Performed by: Kathryne Hitch, CRNA Pre-anesthesia Checklist: Patient identified, Emergency Drugs available, Suction available, Patient being monitored and Timeout performed Patient Re-evaluated:Patient Re-evaluated prior to induction Oxygen Delivery Method: Circle system utilized Preoxygenation: Pre-oxygenation with 100% oxygen Induction Type: IV induction Ventilation: Mask ventilation without difficulty Laryngoscope Size: Miller and 2 Grade View: Grade I Tube type: Oral Tube size: 7.0 mm Number of attempts: 1 Airway Equipment and Method: Stylet and Oral airway Placement Confirmation: ETT inserted through vocal cords under direct vision,  positive ETCO2 and breath sounds checked- equal and bilateral Secured at: 22 cm Tube secured with: Tape Dental Injury: Teeth and Oropharynx as per pre-operative assessment

## 2018-05-10 NOTE — Anesthesia Preprocedure Evaluation (Addendum)
Anesthesia Evaluation  Patient identified by MRN, date of birth, ID band Patient awake    Reviewed: Allergy & Precautions, NPO status , Patient's Chart, lab work & pertinent test results  Airway Mallampati: II  TM Distance: >3 FB Neck ROM: Full    Dental  (+) Teeth Intact, Poor Dentition, Dental Advisory Given,    Pulmonary    breath sounds clear to auscultation       Cardiovascular hypertension,  Rhythm:Regular Rate:Normal     Neuro/Psych    GI/Hepatic   Endo/Other    Renal/GU      Musculoskeletal   Abdominal (+) + obese,   Peds  Hematology   Anesthesia Other Findings   Reproductive/Obstetrics                           Anesthesia Physical Anesthesia Plan  ASA: III  Anesthesia Plan: General   Post-op Pain Management:    Induction: Intravenous  PONV Risk Score and Plan: Ondansetron and Dexamethasone  Airway Management Planned: Oral ETT  Additional Equipment:   Intra-op Plan:   Post-operative Plan: Extubation in OR  Informed Consent: I have reviewed the patients History and Physical, chart, labs and discussed the procedure including the risks, benefits and alternatives for the proposed anesthesia with the patient or authorized representative who has indicated his/her understanding and acceptance.     Dental advisory given  Plan Discussed with: CRNA and Anesthesiologist  Anesthesia Plan Comments:        Anesthesia Quick Evaluation

## 2018-05-10 NOTE — Interval H&P Note (Signed)
History and Physical Interval Note:  05/10/2018 9:49 AM  Samantha Santos  has presented today for surgery, with the diagnosis of SUPRACLAVICULAR ADENOPATHY  The various methods of treatment have been discussed with the patient and family. After consideration of risks, benefits and other options for treatment, the patient has consented to  Procedure(s): SUPRACLAVICAL NODE BIOPSY (Right) as a surgical intervention .  The patient's history has been reviewed, patient examined, no change in status, stable for surgery.  I have reviewed the patient's chart and labs.  Questions were answered to the patient's satisfaction.     [name]

## 2018-05-10 NOTE — Transfer of Care (Signed)
Immediate Anesthesia Transfer of Care Note  Patient: Samantha Santos  Procedure(s) Performed: SUPRACLAVICAL NODE BIOPSY (Right )  Patient Location: PACU  Anesthesia Type:General  Level of Consciousness: drowsy and patient cooperative  Airway & Oxygen Therapy: Patient Spontanous Breathing and Patient connected to face mask oxygen  Post-op Assessment: Report given to RN and Post -op Vital signs reviewed and stable  Post vital signs: Reviewed and stable  Last Vitals:  Vitals Value Taken Time  BP 104/88 05/10/2018 11:19 AM  Temp    Pulse 90 05/10/2018 11:21 AM  Resp 20 05/10/2018 11:21 AM  SpO2 99 % 05/10/2018 11:21 AM  Vitals shown include unvalidated device data.  Last Pain:  Vitals:   05/10/18 0755  TempSrc:   PainSc: 0-No pain      Patients Stated Pain Goal: 4 (43/83/81 8403)  Complications: No apparent anesthesia complications

## 2018-05-11 ENCOUNTER — Encounter (HOSPITAL_COMMUNITY): Payer: Self-pay | Admitting: Thoracic Surgery (Cardiothoracic Vascular Surgery)

## 2018-05-17 ENCOUNTER — Ambulatory Visit: Payer: Self-pay | Admitting: Thoracic Surgery (Cardiothoracic Vascular Surgery)

## 2018-05-17 ENCOUNTER — Encounter: Payer: Self-pay | Admitting: Thoracic Surgery (Cardiothoracic Vascular Surgery)

## 2018-05-17 ENCOUNTER — Other Ambulatory Visit: Payer: Self-pay

## 2018-05-17 VITALS — BP 141/98 | HR 94 | Resp 18 | Ht 67.0 in | Wt 297.0 lb

## 2018-05-17 DIAGNOSIS — C7A1 Malignant poorly differentiated neuroendocrine tumors: Secondary | ICD-10-CM
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
## 2018-05-20 ENCOUNTER — Ambulatory Visit: Payer: Self-pay | Admitting: Family Medicine

## 2018-05-20 ENCOUNTER — Encounter: Payer: Self-pay | Admitting: Internal Medicine

## 2018-05-20 ENCOUNTER — Telehealth: Payer: Self-pay | Admitting: *Deleted

## 2018-05-20 ENCOUNTER — Inpatient Hospital Stay: Payer: PRIVATE HEALTH INSURANCE

## 2018-05-20 ENCOUNTER — Encounter: Payer: Self-pay | Admitting: *Deleted

## 2018-05-20 ENCOUNTER — Inpatient Hospital Stay: Payer: PRIVATE HEALTH INSURANCE | Attending: Internal Medicine | Admitting: Internal Medicine

## 2018-05-20 VITALS — BP 115/99 | HR 118 | Temp 97.0°F | Resp 20 | Ht 67.0 in | Wt 289.0 lb

## 2018-05-20 DIAGNOSIS — R9389 Abnormal findings on diagnostic imaging of other specified body structures: Secondary | ICD-10-CM

## 2018-05-20 DIAGNOSIS — I10 Essential (primary) hypertension: Secondary | ICD-10-CM | POA: Diagnosis not present

## 2018-05-20 DIAGNOSIS — Z5111 Encounter for antineoplastic chemotherapy: Secondary | ICD-10-CM | POA: Insufficient documentation

## 2018-05-20 DIAGNOSIS — K219 Gastro-esophageal reflux disease without esophagitis: Secondary | ICD-10-CM | POA: Diagnosis not present

## 2018-05-20 DIAGNOSIS — Z7689 Persons encountering health services in other specified circumstances: Secondary | ICD-10-CM | POA: Insufficient documentation

## 2018-05-20 DIAGNOSIS — C3412 Malignant neoplasm of upper lobe, left bronchus or lung: Secondary | ICD-10-CM | POA: Insufficient documentation

## 2018-05-20 DIAGNOSIS — R5382 Chronic fatigue, unspecified: Secondary | ICD-10-CM

## 2018-05-20 DIAGNOSIS — Z79899 Other long term (current) drug therapy: Secondary | ICD-10-CM | POA: Diagnosis not present

## 2018-05-20 DIAGNOSIS — Z5112 Encounter for antineoplastic immunotherapy: Secondary | ICD-10-CM

## 2018-05-20 DIAGNOSIS — E876 Hypokalemia: Secondary | ICD-10-CM | POA: Diagnosis not present

## 2018-05-20 DIAGNOSIS — M1990 Unspecified osteoarthritis, unspecified site: Secondary | ICD-10-CM | POA: Insufficient documentation

## 2018-05-20 DIAGNOSIS — C7A1 Malignant poorly differentiated neuroendocrine tumors: Secondary | ICD-10-CM | POA: Insufficient documentation

## 2018-05-20 DIAGNOSIS — Z7189 Other specified counseling: Secondary | ICD-10-CM | POA: Insufficient documentation

## 2018-05-20 LAB — CBC WITH DIFFERENTIAL (CANCER CENTER ONLY)
Abs Immature Granulocytes: 0.02 10*3/uL (ref 0.00–0.07)
Basophils Absolute: 0 10*3/uL (ref 0.0–0.1)
Basophils Relative: 0 %
EOS ABS: 0.1 10*3/uL (ref 0.0–0.5)
Eosinophils Relative: 2 %
HCT: 36.8 % (ref 36.0–46.0)
Hemoglobin: 11.9 g/dL — ABNORMAL LOW (ref 12.0–15.0)
Immature Granulocytes: 0 %
Lymphocytes Relative: 30 %
Lymphs Abs: 2 10*3/uL (ref 0.7–4.0)
MCH: 25.1 pg — ABNORMAL LOW (ref 26.0–34.0)
MCHC: 32.3 g/dL (ref 30.0–36.0)
MCV: 77.6 fL — ABNORMAL LOW (ref 80.0–100.0)
Monocytes Absolute: 0.5 10*3/uL (ref 0.1–1.0)
Monocytes Relative: 7 %
Neutro Abs: 4.1 10*3/uL (ref 1.7–7.7)
Neutrophils Relative %: 61 %
PLATELETS: 185 10*3/uL (ref 150–400)
RBC: 4.74 MIL/uL (ref 3.87–5.11)
RDW: 16.2 % — ABNORMAL HIGH (ref 11.5–15.5)
WBC Count: 6.9 10*3/uL (ref 4.0–10.5)
nRBC: 0 % (ref 0.0–0.2)

## 2018-05-20 LAB — CMP (CANCER CENTER ONLY)
ALT: 14 U/L (ref 0–44)
AST: 11 U/L — ABNORMAL LOW (ref 15–41)
Albumin: 3.3 g/dL — ABNORMAL LOW (ref 3.5–5.0)
Alkaline Phosphatase: 119 U/L (ref 38–126)
Anion gap: 8 (ref 5–15)
BILIRUBIN TOTAL: 0.8 mg/dL (ref 0.3–1.2)
BUN: 15 mg/dL (ref 6–20)
CO2: 30 mmol/L (ref 22–32)
Calcium: 9.1 mg/dL (ref 8.9–10.3)
Chloride: 104 mmol/L (ref 98–111)
Creatinine: 1.16 mg/dL — ABNORMAL HIGH (ref 0.44–1.00)
GFR, Est AFR Am: >60 mL/min (ref >60)
GFR, Estimated: 52 mL/min — ABNORMAL LOW (ref >60)
Glucose, Bld: 125 mg/dL — ABNORMAL HIGH (ref 70–99)
Potassium: 3 mmol/L — CL (ref 3.5–5.1)
Sodium: 142 mmol/L (ref 135–145)
Total Protein: 8 g/dL (ref 6.5–8.1)

## 2018-05-20 MED ORDER — LIDOCAINE-PRILOCAINE 2.5-2.5 % EX CREA: 1.0000 "application " | TOPICAL_CREAM | CUTANEOUS | 0 refills | Status: DC | PRN

## 2018-05-20 MED ORDER — PROCHLORPERAZINE MALEATE 10 MG PO TABS: 10.0000 mg | ORAL_TABLET | Freq: Four times a day (QID) | ORAL | 0 refills | Status: DC | PRN

## 2018-05-20 MED ORDER — POTASSIUM CHLORIDE ER 20 MEQ PO TBCR: 10.0000 meq | EXTENDED_RELEASE_TABLET | Freq: Every day | ORAL | 0 refills | Status: DC

## 2018-05-20 NOTE — Progress Notes (Signed)
START ON PATHWAY REGIMEN - Small Cell Lung     Cycles 1 through 4, every 21 days:     Atezolizumab      Carboplatin      Etoposide    Cycles 5 and beyond, every 21 days:     Atezolizumab   **Always confirm dose/schedule in your pharmacy ordering system**  Patient Characteristics: Newly Diagnosed, Preoperative or Nonsurgical Candidate (Clinical Staging), First Line, Extensive Stage Therapeutic Status: Newly Diagnosed, Preoperative or Nonsurgical Candidate (Clinical Staging) AJCC T Category: cT2a AJCC N Category: cN3 AJCC M Category: cM1c AJCC 8 Stage Grouping: IVB Stage Classification: Extensive Intent of Therapy: Non-Curative / Palliative Intent, Discussed with Patient

## 2018-05-20 NOTE — Progress Notes (Signed)
Samantha Santos called me back.  I gave her an appt to be seen today.  She verbalized understanding of appt time and place.
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
## 2018-05-20 NOTE — Telephone Encounter (Signed)
Oncology Nurse Navigator Documentation  Oncology Nurse Navigator Flowsheets 05/20/2018  Navigator Location [facility]  Referral date to RadOnc/MedOnc 05/20/2018  Navigator Encounter Type Telephone/I received referral on Ms. Pippen today and updated Dr. Julien Nordmann. I called to schedule but was unable to reach.  I did leave a vm message for her to call with my name and phone number.   Telephone Outgoing Call  Confirmed Diagnosis Date 05/15/2018  Treatment Phase Pre-Tx/Tx Discussion  Barriers/Navigation Needs Education;Coordination of Care  Education Other  Interventions Coordination of Care;Education  Coordination of Care Other  Education Method Verbal  Acuity Level 2  Time Spent with Patient 30

## 2018-05-21 ENCOUNTER — Other Ambulatory Visit: Payer: Self-pay | Admitting: *Deleted

## 2018-05-21 DIAGNOSIS — C3490 Malignant neoplasm of unspecified part of unspecified bronchus or lung: Secondary | ICD-10-CM

## 2018-05-22 ENCOUNTER — Telehealth: Payer: Self-pay

## 2018-05-22 ENCOUNTER — Inpatient Hospital Stay: Payer: PRIVATE HEALTH INSURANCE

## 2018-05-22 NOTE — Telephone Encounter (Signed)
Patient will receive a calender e in chemo edu today. Per 2/10 los

## 2018-05-27 ENCOUNTER — Encounter: Payer: Self-pay | Admitting: *Deleted

## 2018-05-27 ENCOUNTER — Inpatient Hospital Stay: Payer: PRIVATE HEALTH INSURANCE

## 2018-05-27 ENCOUNTER — Encounter: Payer: Self-pay | Admitting: Internal Medicine

## 2018-05-27 VITALS — BP 139/97 | HR 95 | Temp 98.0°F | Resp 18

## 2018-05-27 DIAGNOSIS — Z5111 Encounter for antineoplastic chemotherapy: Secondary | ICD-10-CM | POA: Diagnosis not present

## 2018-05-27 DIAGNOSIS — C3412 Malignant neoplasm of upper lobe, left bronchus or lung: Secondary | ICD-10-CM

## 2018-05-27 MED ORDER — SODIUM CHLORIDE 0.9 % IV SOLN
1200.0000 mg | Freq: Once | INTRAVENOUS | Status: AC
  Administered 2018-05-27: 1200 mg via INTRAVENOUS
  Filled 2018-05-27: qty 20

## 2018-05-27 MED ORDER — PALONOSETRON HCL INJECTION 0.25 MG/5ML
INTRAVENOUS | Status: AC
  Filled 2018-05-27: qty 5

## 2018-05-27 MED ORDER — SODIUM CHLORIDE 0.9 % IV SOLN
Freq: Once | INTRAVENOUS | Status: AC
  Administered 2018-05-27: 09:00:00 via INTRAVENOUS
  Filled 2018-05-27: qty 5

## 2018-05-27 MED ORDER — SODIUM CHLORIDE 0.9 % IV SOLN
100.0000 mg/m2 | Freq: Once | INTRAVENOUS | Status: AC
  Administered 2018-05-27: 250 mg via INTRAVENOUS
  Filled 2018-05-27: qty 12.5

## 2018-05-27 MED ORDER — PALONOSETRON HCL INJECTION 0.25 MG/5ML
0.2500 mg | Freq: Once | INTRAVENOUS | Status: AC
  Administered 2018-05-27: 0.25 mg via INTRAVENOUS

## 2018-05-27 MED ORDER — SODIUM CHLORIDE 0.9 % IV SOLN
Freq: Once | INTRAVENOUS | Status: AC
  Administered 2018-05-27: 09:00:00 via INTRAVENOUS
  Filled 2018-05-27: qty 250

## 2018-05-27 MED ORDER — SODIUM CHLORIDE 0.9 % IV SOLN
678.5000 mg | Freq: Once | INTRAVENOUS | Status: AC
  Administered 2018-05-27: 680 mg via INTRAVENOUS
  Filled 2018-05-27: qty 68

## 2018-05-27 NOTE — Progress Notes (Signed)
Went to infusion to introduce myself as Arboriculturist and to offer available resources.  Discussed one-time $82 Engineer, drilling to assist with personal expenses while going through treatment. Wrote down what is needed to apply. She verbalized understanding. Will also discuss copay assistance for Udenyca if needed.  Gave my card for any additional financial questions or concerns.

## 2018-05-27 NOTE — Progress Notes (Signed)
Oncology Nurse Navigator Documentation  Oncology Nurse Navigator Flowsheets 05/27/2018  Navigator Location [facility]  Navigator Encounter Type Treatment/I spoke with patient and family.  I gave and explained information on lung cancer and treatment plan.  She verbalized understanding.  I asked that she reach out to me if needed.   Abnormal Finding Date 04/06/2018  Treatment Initiated Date 05/27/2018  Patient Visit Type MedOnc  Treatment Phase First Chemo Tx  Barriers/Navigation Needs Education  Education Understanding Cancer/ Treatment Options;Newly Diagnosed Cancer Education;Other  Interventions Education  Education Method Verbal;Written  Acuity Level 2  Time Spent with Patient 30

## 2018-05-27 NOTE — Patient Instructions (Signed)
[facility] Discharge Instructions for Patients Receiving Chemotherapy  Today you received the following chemotherapy agents etopiside; carboplatin; vepesid  To help prevent nausea and vomiting after your treatment, we encourage you to take your nausea medication as directed If you develop nausea and vomiting that is not controlled by your nausea medication, call the clinic.   BELOW ARE SYMPTOMS THAT SHOULD BE REPORTED IMMEDIATELY:  *FEVER GREATER THAN 100.5 F  *CHILLS WITH OR WITHOUT FEVER  NAUSEA AND VOMITING THAT IS NOT CONTROLLED WITH YOUR NAUSEA MEDICATION  *UNUSUAL SHORTNESS OF BREATH  *UNUSUAL BRUISING OR BLEEDING  TENDERNESS IN MOUTH AND THROAT WITH OR WITHOUT PRESENCE OF ULCERS  *URINARY PROBLEMS  *BOWEL PROBLEMS  UNUSUAL RASH Items with * indicate a potential emergency and should be followed up as soon as possible.  Feel free to call the clinic should you have any questions or concerns. The clinic phone number is [phone].  Please show the [facility] at check-in to the Emergency Department and triage nurse.  Atezolizumab injection What is this medicine? ATEZOLIZUMAB (a te zoe LIZ ue mab) is a monoclonal antibody. It is used to treat bladder cancer (urothelial cancer), non-small cell lung cancer, small cell lung cancer, and breast cancer. This medicine may be used for other purposes; ask your health care provider or pharmacist if you have questions. COMMON BRAND NAME(S): Tecentriq What should I tell my health care provider before I take this medicine? They need to know if you have any of these conditions: -diabetes -immune system problems -infection -inflammatory bowel disease -liver disease -lung or breathing disease -lupus -nervous system problems like myasthenia gravis or Guillain-Barre syndrome -organ transplant -an unusual or allergic reaction to atezolizumab, other medicines, foods, dyes, or preservatives -pregnant or trying  to get pregnant -breast-feeding How should I use this medicine? This medicine is for infusion into a vein. It is given by a health care professional in a hospital or clinic setting. A special MedGuide will be given to you before each treatment. Be sure to read this information carefully each time. Talk to your pediatrician regarding the use of this medicine in children. Special care may be needed. Overdosage: If you think you have taken too much of this medicine contact a poison control center or emergency room at once. NOTE: This medicine is only for you. Do not share this medicine with others. What if I miss a dose? It is important not to miss your dose. Call your doctor or health care professional if you are unable to keep an appointment. What may interact with this medicine? Interactions have not been studied. This list may not describe all possible interactions. Give your health care provider a list of all the medicines, herbs, non-prescription drugs, or dietary supplements you use. Also tell them if you smoke, drink alcohol, or use illegal drugs. Some items may interact with your medicine. What should I watch for while using this medicine? Your condition will be monitored carefully while you are receiving this medicine. You may need blood work done while you are taking this medicine. Do not become pregnant while taking this medicine or for at least 5 months after stopping it. Women should inform their doctor if they wish to become pregnant or think they might be pregnant. There is a potential for serious side effects to an unborn child. Talk to your health care professional or pharmacist for more information. Do not breast-feed an infant while taking this medicine or for at least 5 months  after the last dose. What side effects may I notice from receiving this medicine? Side effects that you should report to your doctor or health care professional as soon as possible: -allergic reactions like  skin rash, itching or hives, swelling of the face, lips, or tongue -black, tarry stools -bloody or watery diarrhea -breathing problems -changes in vision -chest pain or chest tightness -chills -facial flushing -fever -headache -signs and symptoms of high blood sugar such as dizziness; dry mouth; dry skin; fruity breath; nausea; stomach pain; increased hunger or thirst; increased urination -signs and symptoms of liver injury like dark yellow or brown urine; general ill feeling or flu-like symptoms; light-colored stools; loss of appetite; nausea; right upper belly pain; unusually weak or tired; yellowing of the eyes or skin -stomach pain -trouble passing urine or change in the amount of urine Side effects that usually do not require medical attention (report to your doctor or health care professional if they continue or are bothersome): -cough -diarrhea -joint pain -muscle pain -muscle weakness -tiredness -weight loss This list may not describe all possible side effects. Call your doctor for medical advice about side effects. You may report side effects to FDA at 1-800-FDA-1088. Where should I keep my medicine? This drug is given in a hospital or clinic and will not be stored at home. NOTE: This sheet is a summary. It may not cover all possible information. If you have questions about this medicine, talk to your doctor, pharmacist, or health care provider.  2019 Elsevier/Gold Standard (2017-07-06 09:33:38)   Carboplatin injection What is this medicine? CARBOPLATIN (KAR boe pla tin) is a chemotherapy drug. It targets fast dividing cells, like cancer cells, and causes these cells to die. This medicine is used to treat ovarian cancer and many other cancers. This medicine may be used for other purposes; ask your health care provider or pharmacist if you have questions. COMMON BRAND NAME(S): Paraplatin What should I tell my health care provider before I take this medicine? They need to  know if you have any of these conditions: -blood disorders -hearing problems -kidney disease -recent or ongoing radiation therapy -an unusual or allergic reaction to carboplatin, cisplatin, other chemotherapy, other medicines, foods, dyes, or preservatives -pregnant or trying to get pregnant -breast-feeding How should I use this medicine? This drug is usually given as an infusion into a vein. It is administered in a hospital or clinic by a specially trained health care professional. Talk to your pediatrician regarding the use of this medicine in children. Special care may be needed. Overdosage: If you think you have taken too much of this medicine contact a poison control center or emergency room at once. NOTE: This medicine is only for you. Do not share this medicine with others. What if I miss a dose? It is important not to miss a dose. Call your doctor or health care professional if you are unable to keep an appointment. What may interact with this medicine? -medicines for seizures -medicines to increase blood counts like filgrastim, pegfilgrastim, sargramostim -some antibiotics like amikacin, gentamicin, neomycin, streptomycin, tobramycin -vaccines Talk to your doctor or health care professional before taking any of these medicines: -acetaminophen -aspirin -ibuprofen -ketoprofen -naproxen This list may not describe all possible interactions. Give your health care provider a list of all the medicines, herbs, non-prescription drugs, or dietary supplements you use. Also tell them if you smoke, drink alcohol, or use illegal drugs. Some items may interact with your medicine. What should I watch for while using  this medicine? Your condition will be monitored carefully while you are receiving this medicine. You will need important blood work done while you are taking this medicine. This drug may make you feel generally unwell. This is not uncommon, as chemotherapy can affect healthy cells  as well as cancer cells. Report any side effects. Continue your course of treatment even though you feel ill unless your doctor tells you to stop. In some cases, you may be given additional medicines to help with side effects. Follow all directions for their use. Call your doctor or health care professional for advice if you get a fever, chills or sore throat, or other symptoms of a cold or flu. Do not treat yourself. This drug decreases your body's ability to fight infections. Try to avoid being around people who are sick. This medicine may increase your risk to bruise or bleed. Call your doctor or health care professional if you notice any unusual bleeding. Be careful brushing and flossing your teeth or using a toothpick because you may get an infection or bleed more easily. If you have any dental work done, tell your dentist you are receiving this medicine. Avoid taking products that contain aspirin, acetaminophen, ibuprofen, naproxen, or ketoprofen unless instructed by your doctor. These medicines may hide a fever. Do not become pregnant while taking this medicine. Women should inform their doctor if they wish to become pregnant or think they might be pregnant. There is a potential for serious side effects to an unborn child. Talk to your health care professional or pharmacist for more information. Do not breast-feed an infant while taking this medicine. What side effects may I notice from receiving this medicine? Side effects that you should report to your doctor or health care professional as soon as possible: -allergic reactions like skin rash, itching or hives, swelling of the face, lips, or tongue -signs of infection - fever or chills, cough, sore throat, pain or difficulty passing urine -signs of decreased platelets or bleeding - bruising, pinpoint red spots on the skin, black, tarry stools, nosebleeds -signs of decreased red blood cells - unusually weak or tired, fainting spells,  lightheadedness -breathing problems -changes in hearing -changes in vision -chest pain -high blood pressure -low blood counts - This drug may decrease the number of white blood cells, red blood cells and platelets. You may be at increased risk for infections and bleeding. -nausea and vomiting -pain, swelling, redness or irritation at the injection site -pain, tingling, numbness in the hands or feet -problems with balance, talking, walking -trouble passing urine or change in the amount of urine Side effects that usually do not require medical attention (report to your doctor or health care professional if they continue or are bothersome): -hair loss -loss of appetite -metallic taste in the mouth or changes in taste This list may not describe all possible side effects. Call your doctor for medical advice about side effects. You may report side effects to FDA at 1-800-FDA-1088. Where should I keep my medicine? This drug is given in a hospital or clinic and will not be stored at home. NOTE: This sheet is a summary. It may not cover all possible information. If you have questions about this medicine, talk to your doctor, pharmacist, or health care provider.  2019 Elsevier/Gold Standard (2007-07-09 14:38:05)   Etoposide, VP-16 injection What is this medicine? ETOPOSIDE, VP-16 (e toe POE side) is a chemotherapy drug. It is used to treat testicular cancer, lung cancer, and other cancers. This medicine may  be used for other purposes; ask your health care provider or pharmacist if you have questions. COMMON BRAND NAME(S): Etopophos, Toposar, VePesid What should I tell my health care provider before I take this medicine? They need to know if you have any of these conditions: -infection -kidney disease -liver disease -low blood counts, like low white cell, platelet, or red cell counts -an unusual or allergic reaction to etoposide, other medicines, foods, dyes, or preservatives -pregnant or  trying to get pregnant -breast-feeding How should I use this medicine? This medicine is for infusion into a vein. It is administered in a hospital or clinic by a specially trained health care professional. Talk to your pediatrician regarding the use of this medicine in children. Special care may be needed. Overdosage: If you think you have taken too much of this medicine contact a poison control center or emergency room at once. NOTE: This medicine is only for you. Do not share this medicine with others. What if I miss a dose? It is important not to miss your dose. Call your doctor or health care professional if you are unable to keep an appointment. What may interact with this medicine? -aspirin -certain medications for seizures like carbamazepine, phenobarbital, phenytoin, valproic acid -cyclosporine -levamisole -warfarin This list may not describe all possible interactions. Give your health care provider a list of all the medicines, herbs, non-prescription drugs, or dietary supplements you use. Also tell them if you smoke, drink alcohol, or use illegal drugs. Some items may interact with your medicine. What should I watch for while using this medicine? Visit your doctor for checks on your progress. This drug may make you feel generally unwell. This is not uncommon, as chemotherapy can affect healthy cells as well as cancer cells. Report any side effects. Continue your course of treatment even though you feel ill unless your doctor tells you to stop. In some cases, you may be given additional medicines to help with side effects. Follow all directions for their use. Call your doctor or health care professional for advice if you get a fever, chills or sore throat, or other symptoms of a cold or flu. Do not treat yourself. This drug decreases your body's ability to fight infections. Try to avoid being around people who are sick. This medicine may increase your risk to bruise or bleed. Call your  doctor or health care professional if you notice any unusual bleeding. Talk to your doctor about your risk of cancer. You may be more at risk for certain types of cancers if you take this medicine. Do not become pregnant while taking this medicine or for at least 6 months after stopping it. Women should inform their doctor if they wish to become pregnant or think they might be pregnant. Women of child-bearing potential will need to have a negative pregnancy test before starting this medicine. There is a potential for serious side effects to an unborn child. Talk to your health care professional or pharmacist for more information. Do not breast-feed an infant while taking this medicine. Men must use a latex condom during sexual contact with a woman while taking this medicine and for at least 4 months after stopping it. A latex condom is needed even if you have had a vasectomy. Contact your doctor right away if your partner becomes pregnant. Do not donate sperm while taking this medicine and for at least 4 months after you stop taking this medicine. Men should inform their doctors if they wish to father a child.  This medicine may lower sperm counts. What side effects may I notice from receiving this medicine? Side effects that you should report to your doctor or health care professional as soon as possible: -allergic reactions like skin rash, itching or hives, swelling of the face, lips, or tongue -low blood counts - this medicine may decrease the number of white blood cells, red blood cells and platelets. You may be at increased risk for infections and bleeding. -signs of infection - fever or chills, cough, sore throat, pain or difficulty passing urine -signs of decreased platelets or bleeding - bruising, pinpoint red spots on the skin, black, tarry stools, blood in the urine -signs of decreased red blood cells - unusually weak or tired, fainting spells, lightheadedness -breathing problems -changes in  vision -mouth or throat sores or ulcers -pain, redness, swelling or irritation at the injection site -pain, tingling, numbness in the hands or feet -redness, blistering, peeling or loosening of the skin, including inside the mouth -seizures -vomiting Side effects that usually do not require medical attention (report to your doctor or health care professional if they continue or are bothersome): -diarrhea -hair loss -loss of appetite -nausea -stomach pain This list may not describe all possible side effects. Call your doctor for medical advice about side effects. You may report side effects to FDA at 1-800-FDA-1088. Where should I keep my medicine? This drug is given in a hospital or clinic and will not be stored at home. NOTE: This sheet is a summary. It may not cover all possible information. If you have questions about this medicine, talk to your doctor, pharmacist, or health care provider.  2019 Elsevier/Gold Standard (2015-03-26 11:53:23)

## 2018-05-28 ENCOUNTER — Inpatient Hospital Stay: Payer: PRIVATE HEALTH INSURANCE

## 2018-05-28 VITALS — BP 131/92 | HR 101 | Temp 97.9°F | Resp 20

## 2018-05-28 DIAGNOSIS — C3412 Malignant neoplasm of upper lobe, left bronchus or lung: Secondary | ICD-10-CM

## 2018-05-28 DIAGNOSIS — Z5111 Encounter for antineoplastic chemotherapy: Secondary | ICD-10-CM | POA: Diagnosis not present

## 2018-05-28 MED ORDER — DEXAMETHASONE SODIUM PHOSPHATE 10 MG/ML IJ SOLN
10.0000 mg | Freq: Once | INTRAMUSCULAR | Status: AC
  Administered 2018-05-28: 10 mg via INTRAVENOUS

## 2018-05-28 MED ORDER — SODIUM CHLORIDE 0.9 % IV SOLN
100.0000 mg/m2 | Freq: Once | INTRAVENOUS | Status: AC
  Administered 2018-05-28: 250 mg via INTRAVENOUS
  Filled 2018-05-28: qty 12.5

## 2018-05-28 MED ORDER — DEXAMETHASONE SODIUM PHOSPHATE 10 MG/ML IJ SOLN
INTRAMUSCULAR | Status: AC
  Filled 2018-05-28: qty 1

## 2018-05-28 MED ORDER — SODIUM CHLORIDE 0.9 % IV SOLN
Freq: Once | INTRAVENOUS | Status: AC
  Administered 2018-05-28: 09:00:00 via INTRAVENOUS
  Filled 2018-05-28: qty 250

## 2018-05-28 NOTE — Patient Instructions (Signed)
[facility] Discharge Instructions for Patients Receiving Chemotherapy  Today you received the following chemotherapy agents Etoposide.  To help prevent nausea and vomiting after your treatment, we encourage you to take your nausea medication as directed.  If you develop nausea and vomiting that is not controlled by your nausea medication, call the clinic.   BELOW ARE SYMPTOMS THAT SHOULD BE REPORTED IMMEDIATELY:  *FEVER GREATER THAN 100.5 F  *CHILLS WITH OR WITHOUT FEVER  NAUSEA AND VOMITING THAT IS NOT CONTROLLED WITH YOUR NAUSEA MEDICATION  *UNUSUAL SHORTNESS OF BREATH  *UNUSUAL BRUISING OR BLEEDING  TENDERNESS IN MOUTH AND THROAT WITH OR WITHOUT PRESENCE OF ULCERS  *URINARY PROBLEMS  *BOWEL PROBLEMS  UNUSUAL RASH Items with * indicate a potential emergency and should be followed up as soon as possible.  Feel free to call the clinic should you have any questions or concerns. The clinic phone number is [phone].  Please show the [facility] at check-in to the Emergency Department and triage nurse.

## 2018-05-29 ENCOUNTER — Inpatient Hospital Stay: Payer: PRIVATE HEALTH INSURANCE

## 2018-05-29 VITALS — BP 141/95 | HR 98 | Temp 98.4°F | Resp 18

## 2018-05-29 DIAGNOSIS — C3412 Malignant neoplasm of upper lobe, left bronchus or lung: Secondary | ICD-10-CM

## 2018-05-29 DIAGNOSIS — Z5111 Encounter for antineoplastic chemotherapy: Secondary | ICD-10-CM | POA: Diagnosis not present

## 2018-05-29 MED ORDER — DEXAMETHASONE SODIUM PHOSPHATE 10 MG/ML IJ SOLN
INTRAMUSCULAR | Status: AC
  Filled 2018-05-29: qty 1

## 2018-05-29 MED ORDER — PEGFILGRASTIM 6 MG/0.6ML ~~LOC~~ PSKT
6.0000 mg | PREFILLED_SYRINGE | Freq: Once | SUBCUTANEOUS | Status: AC
  Administered 2018-05-29: 6 mg via SUBCUTANEOUS

## 2018-05-29 MED ORDER — PEGFILGRASTIM 6 MG/0.6ML ~~LOC~~ PSKT
PREFILLED_SYRINGE | SUBCUTANEOUS | Status: AC
  Filled 2018-05-29: qty 0.6

## 2018-05-29 MED ORDER — SODIUM CHLORIDE 0.9 % IV SOLN
Freq: Once | INTRAVENOUS | Status: AC
  Administered 2018-05-29: 09:00:00 via INTRAVENOUS
  Filled 2018-05-29: qty 250

## 2018-05-29 MED ORDER — SODIUM CHLORIDE 0.9 % IV SOLN
100.0000 mg/m2 | Freq: Once | INTRAVENOUS | Status: AC
  Administered 2018-05-29: 250 mg via INTRAVENOUS
  Filled 2018-05-29: qty 12.5

## 2018-05-29 MED ORDER — DEXAMETHASONE SODIUM PHOSPHATE 10 MG/ML IJ SOLN
10.0000 mg | Freq: Once | INTRAMUSCULAR | Status: AC
  Administered 2018-05-29: 10 mg via INTRAVENOUS

## 2018-05-29 NOTE — Patient Instructions (Signed)
[facility] Discharge Instructions for Patients Receiving Chemotherapy  Today you received the following chemotherapy agents Etoposide.   To help prevent nausea and vomiting after your treatment, we encourage you to take your nausea medication as directed.  If you develop nausea and vomiting that is not controlled by your nausea medication, call the clinic.   BELOW ARE SYMPTOMS THAT SHOULD BE REPORTED IMMEDIATELY:  *FEVER GREATER THAN 100.5 F  *CHILLS WITH OR WITHOUT FEVER  NAUSEA AND VOMITING THAT IS NOT CONTROLLED WITH YOUR NAUSEA MEDICATION  *UNUSUAL SHORTNESS OF BREATH  *UNUSUAL BRUISING OR BLEEDING  TENDERNESS IN MOUTH AND THROAT WITH OR WITHOUT PRESENCE OF ULCERS  *URINARY PROBLEMS  *BOWEL PROBLEMS  UNUSUAL RASH Items with * indicate a potential emergency and should be followed up as soon as possible.  Feel free to call the clinic should you have any questions or concerns. The clinic phone number is [phone].  Please show the [facility] at check-in to the Emergency Department and triage nurse.

## 2018-05-30 ENCOUNTER — Encounter (HOSPITAL_COMMUNITY): Payer: Self-pay | Admitting: Internal Medicine

## 2018-05-31 ENCOUNTER — Telehealth: Payer: Self-pay | Admitting: Medical Oncology

## 2018-05-31 ENCOUNTER — Ambulatory Visit: Payer: PRIVATE HEALTH INSURANCE

## 2018-05-31 NOTE — Telephone Encounter (Signed)
":  I'm doing okay. I am eating and drinking". Voiced understanding for when to call.

## 2018-06-03 ENCOUNTER — Inpatient Hospital Stay (HOSPITAL_BASED_OUTPATIENT_CLINIC_OR_DEPARTMENT_OTHER): Payer: PRIVATE HEALTH INSURANCE | Admitting: Physician Assistant

## 2018-06-03 ENCOUNTER — Ambulatory Visit (HOSPITAL_COMMUNITY)
Admission: RE | Admit: 2018-06-03 | Discharge: 2018-06-03 | Disposition: A | Discharge status: Home / Self Care | Payer: PRIVATE HEALTH INSURANCE | Source: Ambulatory Visit | Attending: Internal Medicine | Admitting: Internal Medicine

## 2018-06-03 ENCOUNTER — Inpatient Hospital Stay: Payer: PRIVATE HEALTH INSURANCE

## 2018-06-03 ENCOUNTER — Encounter: Payer: Self-pay | Admitting: Physician Assistant

## 2018-06-03 VITALS — BP 130/76 | HR 99 | Temp 97.6°F | Resp 18 | Ht 67.0 in | Wt 287.4 lb

## 2018-06-03 DIAGNOSIS — C3412 Malignant neoplasm of upper lobe, left bronchus or lung: Secondary | ICD-10-CM | POA: Diagnosis present

## 2018-06-03 DIAGNOSIS — Z79899 Other long term (current) drug therapy: Secondary | ICD-10-CM | POA: Diagnosis not present

## 2018-06-03 DIAGNOSIS — Z5111 Encounter for antineoplastic chemotherapy: Secondary | ICD-10-CM | POA: Diagnosis not present

## 2018-06-03 DIAGNOSIS — C7A1 Malignant poorly differentiated neuroendocrine tumors: Secondary | ICD-10-CM

## 2018-06-03 DIAGNOSIS — E876 Hypokalemia: Secondary | ICD-10-CM | POA: Diagnosis not present

## 2018-06-03 DIAGNOSIS — R5382 Chronic fatigue, unspecified: Secondary | ICD-10-CM

## 2018-06-03 LAB — CBC WITH DIFFERENTIAL (CANCER CENTER ONLY)
Abs Immature Granulocytes: 0.01 10*3/uL (ref 0.00–0.07)
BASOS PCT: 1 %
Basophils Absolute: 0 10*3/uL (ref 0.0–0.1)
Eosinophils Absolute: 0 10*3/uL (ref 0.0–0.5)
Eosinophils Relative: 2 %
HCT: 34.3 % — ABNORMAL LOW (ref 36.0–46.0)
Hemoglobin: 10.8 g/dL — ABNORMAL LOW (ref 12.0–15.0)
Immature Granulocytes: 1 %
Lymphocytes Relative: 67 %
Lymphs Abs: 1.4 10*3/uL (ref 0.7–4.0)
MCH: 24.7 pg — ABNORMAL LOW (ref 26.0–34.0)
MCHC: 31.5 g/dL (ref 30.0–36.0)
MCV: 78.5 fL — ABNORMAL LOW (ref 80.0–100.0)
Monocytes Absolute: 0 10*3/uL — ABNORMAL LOW (ref 0.1–1.0)
Monocytes Relative: 1 %
Neutro Abs: 0.6 10*3/uL — ABNORMAL LOW (ref 1.7–7.7)
Neutrophils Relative %: 28 %
Platelet Count: 98 10*3/uL — ABNORMAL LOW (ref 150–400)
RBC: 4.37 MIL/uL (ref 3.87–5.11)
RDW: 15.8 % — ABNORMAL HIGH (ref 11.5–15.5)
WBC Count: 2 10*3/uL — ABNORMAL LOW (ref 4.0–10.5)
nRBC: 0 % (ref 0.0–0.2)

## 2018-06-03 LAB — CMP (CANCER CENTER ONLY)
ALT: 14 U/L (ref 0–44)
AST: 7 U/L — ABNORMAL LOW (ref 15–41)
Albumin: 3.3 g/dL — ABNORMAL LOW (ref 3.5–5.0)
Alkaline Phosphatase: 109 U/L (ref 38–126)
Anion gap: 8 (ref 5–15)
BUN: 24 mg/dL — ABNORMAL HIGH (ref 6–20)
CO2: 32 mmol/L (ref 22–32)
Calcium: 8.7 mg/dL — ABNORMAL LOW (ref 8.9–10.3)
Chloride: 100 mmol/L (ref 98–111)
Creatinine: 1.05 mg/dL — ABNORMAL HIGH (ref 0.44–1.00)
GFR, Est AFR Am: >60 mL/min (ref >60)
GFR, Estimated: 59 mL/min — ABNORMAL LOW (ref >60)
Glucose, Bld: 102 mg/dL — ABNORMAL HIGH (ref 70–99)
Potassium: 3.6 mmol/L (ref 3.5–5.1)
Sodium: 140 mmol/L (ref 135–145)
Total Bilirubin: 1.1 mg/dL (ref 0.3–1.2)
Total Protein: 7.6 g/dL (ref 6.5–8.1)

## 2018-06-03 MED ORDER — GADOBUTROL 1 MMOL/ML IV SOLN
10.0000 mL | Freq: Once | INTRAVENOUS | Status: AC | PRN
  Administered 2018-06-03: 10 mL via INTRAVENOUS

## 2018-06-03 NOTE — Progress Notes (Signed)
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  PRIOR THERAPY: None  CURRENT THERAPY: Carboplatin for AUC of 5 on day 1, etoposide 100 mg/M2 on days 1, 2 and 3 with Neulasta support in addition to [place]. First dose February 10th, 2020. Status post cycle 1.  INTERVAL HISTORY: Samantha Santos 58 y.o. female to the clinic today for a follow-up visit today accompanied by her daughter, Sydell Axon. She completed her first cycle of treatment last week without any adverse effects except for some mild nausea which was successfully managed with compazine. She also endorses some mild changes to her taste; however, she states she is still ensuring that she eats. She denies any fevers, chills, or night sweats. She denies any chest pain, shortness of breath, cough, or hemoptysis. She denies vomiting, diarrhea, or constipation. She denies any headache or visual changes. She is here today for evaluation following completing her first cycle of treatment.   MEDICAL HISTORY: Past Medical History:  Diagnosis Date  . Anemia   . Arthritis   . GERD (gastroesophageal reflux disease)   . Hypertension   . Morbid obesity ([place]) 05/06/2018  . Supraclavicular adenopathy     ALLERGIES:  is allergic to penicillins.  MEDICATIONS:  Current Outpatient Medications  Medication Sig Dispense Refill  . ibuprofen (ADVIL,MOTRIN) 200 MG tablet Take 600 mg by mouth every 6 (six) hours as needed (for pains).    . lidocaine-prilocaine (EMLA) cream Apply 1 application topically as needed. 30 g 0  . losartan-hydrochlorothiazide (HYZAAR) 100-12.5 MG tablet Take 1 tablet by mouth daily. To lower blood pressure 30 tablet 4  . potassium chloride 20 MEQ TBCR Take 10 mEq by mouth daily. 7 tablet 0  . prochlorperazine (COMPAZINE) 10  MG tablet Take 1 tablet (10 mg total) by mouth every 6 (six) hours as needed for nausea or vomiting. 30 tablet 0  . oxyCODONE (OXY IR/ROXICODONE) 5 MG immediate release tablet Take 1 tablet (5 mg total) by mouth every 6 (six) hours as needed (for pain score of 1-4). (Patient not taking: Reported on 05/20/2018) 20 tablet 0   No current facility-administered medications for this visit.     SURGICAL HISTORY:  Past Surgical History:  Procedure Laterality Date  . CESAREAN SECTION  1988  . HYSTEROSCOPY W/D&C  06/14/2011   Procedure: DILATATION AND CURETTAGE /HYSTEROSCOPY;  Surgeon: Frederico Hamman, MD;  Location: [facility];  Service: Gynecology;  Laterality: N/A;  . SUPRACLAVICAL NODE BIOPSY Right 05/10/2018   Procedure: SUPRACLAVICAL NODE BIOPSY;  Surgeon: [name], MD;  Location: [facility];  Service: Thoracic;  Laterality: Right;  . TUBAL LIGATION      REVIEW OF SYSTEMS:   Review of Systems  Constitutional: Negative for appetite change, chills, fatigue, fever and unexpected weight change.  HENT:   Negative for mouth sores, nosebleeds, sore throat and trouble swallowing.   Eyes: Negative for eye problems and icterus.  Respiratory: Negative for cough, hemoptysis, shortness of breath and wheezing.   Cardiovascular: Negative for chest pain and leg swelling.  Gastrointestinal: Positive for mild nausea. Negative for abdominal pain, constipation, diarrhea, and vomiting.  Genitourinary: Negative for bladder incontinence, difficulty urinating, dysuria, frequency and hematuria.   Musculoskeletal: Negative for back pain, gait problem, neck pain and neck stiffness.  Skin: Negative for itching and rash.  Neurological:  Negative for dizziness, extremity weakness, gait problem, headaches, light-headedness and seizures.  Hematological: Negative for adenopathy. Does not bruise/bleed easily.  Psychiatric/Behavioral: Negative for confusion, depression and sleep disturbance. The patient is not  nervous/anxious.     PHYSICAL EXAMINATION:  Blood pressure 130/76, pulse 99, temperature 97.6 F (36.4 C), temperature source Oral, resp. rate 18, height 5\' 7"  (1.702 m), weight 287 lb 6.4 oz (130.4 kg), SpO2 100 %.  ECOG PERFORMANCE STATUS: 1 - Symptomatic but completely ambulatory  Physical Exam  Constitutional: Oriented to person, place, and time and well-developed, well-nourished, and in no distress. No distress.  HENT:  Head: Normocephalic and atraumatic.  Mouth/Throat: Oropharynx is clear and moist. No oropharyngeal exudate.  Eyes: Conjunctivae are normal. Right eye exhibits no discharge. Left eye exhibits no discharge. No scleral icterus.  Neck: Normal range of motion. Neck supple.  Cardiovascular: Normal rate, regular rhythm, normal heart sounds and intact distal pulses.   Pulmonary/Chest: Effort normal and breath sounds normal. No respiratory distress. No wheezes. No rales.  Abdominal: Soft. Bowel sounds are normal. Exhibits no distension and no mass. There is no tenderness.  Musculoskeletal: Normal range of motion. Exhibits no edema.  Lymphadenopathy:    No cervical adenopathy.  Neurological: Alert and oriented to person, place, and time. Exhibits normal muscle tone. Gait normal. Coordination normal.  Skin: Skin is warm and dry. No rash noted. Not diaphoretic. No erythema. No pallor.  Psychiatric: Mood, memory and judgment normal.  Vitals reviewed.  LABORATORY DATA: Lab Results  Component Value Date   WBC 2.0 (L) 06/03/2018   HGB 10.8 (L) 06/03/2018   HCT 34.3 (L) 06/03/2018   MCV 78.5 (L) 06/03/2018   PLT 98 (L) 06/03/2018      Chemistry      Component Value Date/Time   NA 140 06/03/2018 1412   NA 144 04/08/2018 1145   K 3.6 06/03/2018 1412   CL 100 06/03/2018 1412   CO2 32 06/03/2018 1412   BUN 24 (H) 06/03/2018 1412   BUN 10 04/08/2018 1145   CREATININE 1.05 (H) 06/03/2018 1412      Component Value Date/Time   CALCIUM 8.7 (L) 06/03/2018 1412   ALKPHOS  109 06/03/2018 1412   AST 7 (L) 06/03/2018 1412   ALT 14 06/03/2018 1412   BILITOT 1.1 06/03/2018 1412       RADIOGRAPHIC STUDIES:  Dg Chest 2 View  Result Date: 05/10/2018 CLINICAL DATA:  Preoperative examination for right lung surgery. History of supraclavicular adenopathy. EXAM: CHEST - 2 VIEW COMPARISON:  PET-CT-05/02/2018 FINDINGS: Grossly unchanged borderline enlarged cardiac silhouette and mediastinal contours with mild tortuosity of the thoracic aorta. Read demonstrated bulky mediastinal and hilar lymphadenopathy. Suspected progression of left lower lobe atelectasis/collapse with associated elevation of left hemidiaphragm. Trace left-sided effusion is not excluded. Minimal subsegmental atelectasis within the right costophrenic angle. No evidence of edema. No pneumothorax. No acute osseous abnormalities. Mild scoliotic curvature of the thoracolumbar spine. IMPRESSION: 1. Suspected progression of left lower lobe atelectasis/collapse with elevation of the left hemidiaphragm and potential trace left-sided effusion. 2. Bulky mediastinal and hilar adenopathy as demonstrated on recent PET-CT. Electronically Signed   By: Sandi Mariscal M.D.   On: 05/10/2018 08:05     ASSESSMENT/PLAN:  The patient is a very pleasant 58 year old never smoker, African-American female recently diagnosed with stage IV (T2a, N3, M1C) high-grade neuroendocrine with focal areas of small cell carcinoma diagnosed in January 2020.  The patient is currently undergoing treatment with carboplatin for an AUC of 5  on day 1, etoposide 100 mg/m on days 1, 2, and 3 in addition to Neulasta support as well as Tecentriq.  She is status post 1 cycle.  The patient was seen with Dr. Julien Nordmann today.  The patient continues to tolerate treatment without any concerning adverse effects except for some mild nausea and mild changes to taste. Discussed the importance of maintaining weight and to continue eating meals. We will continue to monitor  her weight at subsequent visits.  Nausea is successfully managed with Compazine.   Patient is due for day 1 of cycle #2 on June 17, 2018.  We will see her for evaluation in 2 weeks prior to starting cycle #2.   Patient will be obtaining a brain MRI today to complete the staging work-up.  We will await the results.  Patient has a Port-A-Cath placement on February 27th, 2020 with Dr. Roxan Hockey.  The patient was advised to call immediately if she has any concerning symptoms in the interval. The patient voices understanding of current disease status and treatment options and is in agreement with the current care plan. All questions were answered. The patient knows to call the clinic with any problems, questions or concerns. We can certainly see the patient much sooner if necessary   No orders of the defined types were placed in this encounter.    Promise Weldin L Kamaree Wheatley, PA-C 06/03/18    ADDENDUM: Hematology/Oncology Attending: I had a face-to-face encounter with the patient today.  I recommended her care plan.  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  She is currently undergoing systemic chemotherapy with carboplatin and etoposide status post 1 cycle.  The patient MRI of the brain scheduled later today. I recommended for her to proceed with the MRI as scheduled later today. She will come back for follow-up visit in 2 weeks for evaluation before starting cycle #2. She was advised to call immediately if she has any concerning symptoms in the interval.  Disclaimer: This note was dictated with voice recognition software. Similar sounding words can inadvertently be transcribed and may be missed upon review. Eilleen Kempf, MD 06/03/18

## 2018-06-05 ENCOUNTER — Telehealth: Payer: Self-pay | Admitting: Family Medicine

## 2018-06-05 LAB — TSH: TSH: 1.364 u[IU]/mL (ref 0.308–3.960)

## 2018-06-05 NOTE — Telephone Encounter (Signed)
1) Medication(s) Requested (by name):  2) Pharmacy of Choice:  3) Special Requests:   Approved medications will be sent to the pharmacy, we will reach out if there is an issue.  Requests made after 3pm may not be addressed until the following business day!  If a patient is unsure of the name of the medication(s) please note and ask patient to call back when they are able to provide all info, do not send to responsible party until all information is available!

## 2018-06-06 ENCOUNTER — Telehealth: Payer: Self-pay | Admitting: Internal Medicine

## 2018-06-06 ENCOUNTER — Encounter (HOSPITAL_COMMUNITY): Payer: Self-pay | Admitting: Internal Medicine

## 2018-06-06 NOTE — Pre-Procedure Instructions (Signed)
Marica Trentham  06/06/2018      [facility] ([facility] [place] [address] [place] ([place]) [place] [address] Phone: [phone] Fax: [phone]    Your procedure is scheduled on June 13, 2018.  Report to [facility] Admitting at 600 AM.  Call this number if you have problems the morning of surgery:  [phone]   Remember:  Do not eat or drink after midnight.    Take these medicines the morning of surgery with A SIP OF WATER  Prochlorperazine (compazine)-if needed  7 days prior to surgery STOP taking any Aspirin (unless otherwise instructed by your surgeon), Aleve, Naproxen, Ibuprofen, Motrin, Advil, Goody's, BC's, all herbal medications, fish oil, and all vitamins    Do not wear jewelry, make-up or nail polish.  Do not wear lotions, powders, or perfumes, or deodorant.  Do not shave 48 hours prior to surgery.   Do not bring valuables to the hospital.  [facility] is not responsible for any belongings or valuables.  Contacts, dentures or bridgework may not be worn into surgery.  Leave your suitcase in the car.  After surgery it may be brought to your room.  For patients admitted to the hospital, discharge time will be determined by your treatment team.  Patients discharged the day of surgery will not be allowed to drive home.    [facility]- Preparing For Surgery  Before surgery, you can play an important role. Because skin is not sterile, your skin needs to be as free of germs as possible. You can reduce the number of germs on your skin by washing with CHG (chlorahexidine gluconate) Soap before surgery.  CHG is an antiseptic cleaner which kills germs and bonds with the skin to continue killing germs even after washing.    Oral Hygiene is also important to reduce your risk of infection.  Remember - BRUSH YOUR TEETH THE MORNING OF SURGERY WITH YOUR REGULAR TOOTHPASTE  Please do not use if you have an allergy to CHG or  antibacterial soaps. If your skin becomes reddened/irritated stop using the CHG.  Do not shave (including legs and underarms) for at least 48 hours prior to first CHG shower. It is OK to shave your face.  Please follow these instructions carefully.   1. Shower the NIGHT BEFORE SURGERY and the MORNING OF SURGERY with CHG.   2. If you chose to wash your hair, wash your hair first as usual with your normal shampoo.  3. After you shampoo, rinse your hair and body thoroughly to remove the shampoo.  4. Use CHG as you would any other liquid soap. You can apply CHG directly to the skin and wash gently with a scrungie or a clean washcloth.   5. Apply the CHG Soap to your body ONLY FROM THE NECK DOWN.  Do not use on open wounds or open sores. Avoid contact with your eyes, ears, mouth and genitals (private parts). Wash Face and genitals (private parts)  with your normal soap.  6. Wash thoroughly, paying special attention to the area where your surgery will be performed.  7. Thoroughly rinse your body with warm water from the neck down.  8. DO NOT shower/wash with your normal soap after using and rinsing off the CHG Soap.  9. Pat yourself dry with a CLEAN TOWEL.  10. Wear CLEAN PAJAMAS to bed the night before surgery, wear comfortable clothes the morning of surgery  11. Place CLEAN  SHEETS on your bed the night of your first shower and DO NOT SLEEP WITH PETS.  Day of Surgery:  Do not apply any deodorants/lotions.  Please wear clean clothes to the hospital/surgery center.   Remember to brush your teeth WITH YOUR REGULAR TOOTHPASTE.  Please read over the fact sheets that you were given.

## 2018-06-06 NOTE — Telephone Encounter (Signed)
Called patient per VM scheduling log.  Cancelled port flush appt, patient not getting the port till 2/27.

## 2018-06-06 NOTE — Progress Notes (Addendum)
PCP -  [name], MD Cardiologist - pt denies  Chest x-ray -  Will be done day of surgery (within 72 hours per orders) EKG - 04/07/18 in EPIC  Stress Test -  Pt denies ECHO - pt denies  Cardiac Cath -  Pt denies  Sleep Study - NO CPAP - n/a  Fasting Blood Sugar - n/a Checks Blood Sugar _____ times a day-n/a  Blood Thinner Instructions: n/a Aspirin Instructions: n/a  Anesthesia review: Yes, EKG, potassium 3.0, platlets 77 l/uL  Patient denies shortness of breath, fever, cough and chest pain at PAT appointment  Patient verbalized understanding of instructions that were given to them at the PAT appointment. Patient was also instructed that they will need to review over the PAT instructions again at home before surgery.

## 2018-06-07 ENCOUNTER — Other Ambulatory Visit: Payer: Self-pay

## 2018-06-07 ENCOUNTER — Encounter (HOSPITAL_COMMUNITY)
Admission: RE | Admit: 2018-06-07 | Discharge: 2018-06-07 | Disposition: A | Discharge status: Home / Self Care | Payer: PRIVATE HEALTH INSURANCE | Source: Ambulatory Visit | Attending: Thoracic Surgery (Cardiothoracic Vascular Surgery) | Admitting: Thoracic Surgery (Cardiothoracic Vascular Surgery)

## 2018-06-07 ENCOUNTER — Encounter (HOSPITAL_COMMUNITY): Payer: Self-pay

## 2018-06-07 DIAGNOSIS — Z01818 Encounter for other preprocedural examination: Secondary | ICD-10-CM | POA: Diagnosis not present

## 2018-06-07 DIAGNOSIS — C3490 Malignant neoplasm of unspecified part of unspecified bronchus or lung: Secondary | ICD-10-CM | POA: Diagnosis not present

## 2018-06-07 LAB — CBC
HCT: 33.6 % — ABNORMAL LOW (ref 36.0–46.0)
Hemoglobin: 10.5 g/dL — ABNORMAL LOW (ref 12.0–15.0)
MCH: 24.9 pg — ABNORMAL LOW (ref 26.0–34.0)
MCHC: 31.3 g/dL (ref 30.0–36.0)
MCV: 79.6 fL — ABNORMAL LOW (ref 80.0–100.0)
NRBC: 0.6 % — AB (ref 0.0–0.2)
Platelets: 77 10*3/uL — ABNORMAL LOW (ref 150–400)
RBC: 4.22 MIL/uL (ref 3.87–5.11)
RDW: 16 % — ABNORMAL HIGH (ref 11.5–15.5)
WBC: 4.6 10*3/uL (ref 4.0–10.5)

## 2018-06-07 LAB — COMPREHENSIVE METABOLIC PANEL
ALBUMIN: 3.3 g/dL — AB (ref 3.5–5.0)
ALT: 17 U/L (ref 0–44)
ANION GAP: 8 (ref 5–15)
AST: 12 U/L — ABNORMAL LOW (ref 15–41)
Alkaline Phosphatase: 83 U/L (ref 38–126)
BUN: 16 mg/dL (ref 6–20)
CO2: 30 mmol/L (ref 22–32)
Calcium: 8.5 mg/dL — ABNORMAL LOW (ref 8.9–10.3)
Chloride: 103 mmol/L (ref 98–111)
Creatinine, Ser: 1.18 mg/dL — ABNORMAL HIGH (ref 0.44–1.00)
GFR calc Af Amer: 59 mL/min — ABNORMAL LOW (ref >60)
GFR calc non Af Amer: 51 mL/min — ABNORMAL LOW (ref >60)
GLUCOSE: 128 mg/dL — AB (ref 70–99)
Potassium: 3 mmol/L — ABNORMAL LOW (ref 3.5–5.1)
Sodium: 141 mmol/L (ref 135–145)
Total Bilirubin: 0.8 mg/dL (ref 0.3–1.2)
Total Protein: 7.4 g/dL (ref 6.5–8.1)

## 2018-06-07 LAB — SURGICAL PCR SCREEN
MRSA, PCR: NEGATIVE
Staphylococcus aureus: POSITIVE — AB

## 2018-06-07 LAB — PROTIME-INR
INR: 1.06
Prothrombin Time: 13.7 seconds (ref 11.4–15.2)

## 2018-06-07 LAB — APTT: aPTT: 31 seconds (ref 24–36)

## 2018-06-07 NOTE — Progress Notes (Signed)
Surgical PCR positive for Staph. Called and left message for patient's daughter, Sydell Axon per patient request.  Called mupirocin ointment prescription in to [facility] on [address] per patient request.

## 2018-06-10 ENCOUNTER — Inpatient Hospital Stay: Payer: PRIVATE HEALTH INSURANCE

## 2018-06-10 ENCOUNTER — Ambulatory Visit (HOSPITAL_BASED_OUTPATIENT_CLINIC_OR_DEPARTMENT_OTHER): Payer: PRIVATE HEALTH INSURANCE | Admitting: Family Medicine

## 2018-06-10 ENCOUNTER — Encounter: Payer: Self-pay | Admitting: Family Medicine

## 2018-06-10 ENCOUNTER — Telehealth: Payer: Self-pay | Admitting: Medical Oncology

## 2018-06-10 ENCOUNTER — Other Ambulatory Visit: Payer: Self-pay | Admitting: Medical Oncology

## 2018-06-10 VITALS — BP 111/81 | HR 102 | Temp 97.6°F | Resp 18 | Ht 67.0 in | Wt 290.0 lb

## 2018-06-10 DIAGNOSIS — E876 Hypokalemia: Secondary | ICD-10-CM

## 2018-06-10 DIAGNOSIS — C3412 Malignant neoplasm of upper lobe, left bronchus or lung: Secondary | ICD-10-CM

## 2018-06-10 DIAGNOSIS — Z79899 Other long term (current) drug therapy: Secondary | ICD-10-CM

## 2018-06-10 DIAGNOSIS — R0609 Other forms of dyspnea: Secondary | ICD-10-CM

## 2018-06-10 DIAGNOSIS — I10 Essential (primary) hypertension: Secondary | ICD-10-CM

## 2018-06-10 DIAGNOSIS — Z5111 Encounter for antineoplastic chemotherapy: Secondary | ICD-10-CM | POA: Diagnosis not present

## 2018-06-10 LAB — CMP (CANCER CENTER ONLY)
ALT: 14 U/L (ref 0–44)
AST: 8 U/L — ABNORMAL LOW (ref 15–41)
Albumin: 3.4 g/dL — ABNORMAL LOW (ref 3.5–5.0)
Alkaline Phosphatase: 122 U/L (ref 38–126)
Anion gap: 9 (ref 5–15)
BUN: 13 mg/dL (ref 6–20)
CO2: 30 mmol/L (ref 22–32)
Calcium: 8.7 mg/dL — ABNORMAL LOW (ref 8.9–10.3)
Chloride: 103 mmol/L (ref 98–111)
Creatinine: 1.05 mg/dL — ABNORMAL HIGH (ref 0.44–1.00)
GFR, Est AFR Am: >60 mL/min (ref >60)
GFR, Estimated: 59 mL/min — ABNORMAL LOW (ref >60)
Glucose, Bld: 91 mg/dL (ref 70–99)
Potassium: 3 mmol/L — CL (ref 3.5–5.1)
Sodium: 142 mmol/L (ref 135–145)
Total Bilirubin: 0.5 mg/dL (ref 0.3–1.2)
Total Protein: 7.7 g/dL (ref 6.5–8.1)

## 2018-06-10 LAB — CBC WITH DIFFERENTIAL (CANCER CENTER ONLY)
Abs Immature Granulocytes: 0.6 10*3/uL — ABNORMAL HIGH (ref 0.00–0.07)
Band Neutrophils: 13 %
Basophils Absolute: 0 10*3/uL (ref 0.0–0.1)
Basophils Relative: 0 %
EOS ABS: 0 10*3/uL (ref 0.0–0.5)
Eosinophils Relative: 0 %
HCT: 33.2 % — ABNORMAL LOW (ref 36.0–46.0)
Hemoglobin: 10.6 g/dL — ABNORMAL LOW (ref 12.0–15.0)
Lymphocytes Relative: 32 %
Lymphs Abs: 2.9 10*3/uL (ref 0.7–4.0)
MCH: 25.4 pg — ABNORMAL LOW (ref 26.0–34.0)
MCHC: 31.9 g/dL (ref 30.0–36.0)
MCV: 79.4 fL — ABNORMAL LOW (ref 80.0–100.0)
Metamyelocytes Relative: 4 %
Monocytes Absolute: 0.7 10*3/uL (ref 0.1–1.0)
Monocytes Relative: 8 %
Myelocytes: 3 %
NEUTROS ABS: 4.9 10*3/uL (ref 1.7–17.7)
Neutrophils Relative %: 40 %
Platelet Count: 81 10*3/uL — ABNORMAL LOW (ref 150–400)
RBC: 4.18 MIL/uL (ref 3.87–5.11)
RDW: 17.1 % — ABNORMAL HIGH (ref 11.5–15.5)
Smear Review: 1
WBC Count: 9.2 10*3/uL (ref 4.0–10.5)
nRBC: 0.3 % — ABNORMAL HIGH (ref 0.0–0.2)

## 2018-06-10 MED ORDER — POTASSIUM CHLORIDE ER 20 MEQ PO TBCR: 20.0000 meq | EXTENDED_RELEASE_TABLET | Freq: Every day | ORAL | 0 refills | Status: DC

## 2018-06-10 NOTE — Progress Notes (Signed)
Subjective:    Patient ID: Samantha Santos, female    DOB: [birth year], 58 y.o.   MRN: [id number]  HPI       58 yo female who is seen status post recent diagnosis of small cell carcinoma of the upper lobe of the left lung who is seen in follow-up of hypertension and on review of labs, patient has also had recent hypokalemia with a potassium of 3.0 as well as anemia with hemoglobin of 10.5 and both labs were done on 06/07/2018 by her cancer doctors.  Patient's daughter reports that the patient has been on a potassium supplement.  Patient also has a lab appointment today at [facility] and therefore needs to leave as soon as possible to get to the lab appointment.      Patient states that overall she feels pretty good considering.  Patient has noticed recent onset of increased shortness of breath with minimal exertion such as walking.  Patient does have some increased shortness of breath when she lies down as well.  Patient denies any increase in peripheral edema.      Patient has had no headaches or dizziness related to her blood pressure.  Patient continues to take her blood pressure medication daily.  She is currently on losartan hydrochlorothiazide.  Patient denies any actual chest pain or palpitations.  Patient does have some longstanding mild swelling in her lower legs.      Patient reports that she is tolerating the chemotherapy however she decided to cut her hair very short after having some hair loss.  Patient does have daily nausea and has to take medication to help keep foods down.  Past Medical History:  Diagnosis Date  . Anemia   . Arthritis   . GERD (gastroesophageal reflux disease)   . Hypertension   . Morbid obesity ([place]) 05/06/2018  . Supraclavicular adenopathy    Past Surgical History:  Procedure Laterality Date  . CESAREAN SECTION  1988  . HYSTEROSCOPY W/D&C  06/14/2011   Procedure: DILATATION AND CURETTAGE /HYSTEROSCOPY;  Surgeon: Frederico Hamman, MD;  Location: [facility];  Service: Gynecology;  Laterality: N/A;  . IR CV LINE INJECTION  06/25/2018  . PORTACATH PLACEMENT Left 06/13/2018   Procedure: INSERTION PORT-A-CATH;  Surgeon: [name], MD;  Location: [facility];  Service: Thoracic;  Laterality: Left;  . SUPRACLAVICAL NODE BIOPSY Right 05/10/2018   Procedure: SUPRACLAVICAL NODE BIOPSY;  Surgeon: [name], MD;  Location: [facility];  Service: Thoracic;  Laterality: Right;  . TUBAL LIGATION     Family History  Problem Relation Age of Onset  . Diabetes Mother   . Hypertension Mother   . CAD Mother   . CVA Mother    Social History   Tobacco Use  . Smoking status: Never Smoker  . Smokeless tobacco: Never Used  Substance Use Topics  . Alcohol use: No  . Drug use: No   Allergies  Allergen Reactions  . Penicillins Other (See Comments)    Syncope Did it involve swelling of the face/tongue/throat, SOB, or low BP? No Did it involve sudden or severe rash/hives, skin peeling, or any reaction on the inside of your mouth or nose? No Did you need to seek medical attention at a hospital or doctor's office? Yes When did it last happen?over 20 years ago If all above answers are "NO", may proceed with cephalosporin use.   Marland Kitchen Oxycodone Other (See Comments)    hallucinations     Review of  Systems  Constitutional: Positive for appetite change (decreased appetite at times) and fatigue. Negative for chills and fever.  HENT: Negative for sore throat and trouble swallowing.   Respiratory: Positive for shortness of breath. Negative for cough.   Cardiovascular: Negative for chest pain, palpitations and leg swelling.  Gastrointestinal: Positive for nausea. Negative for abdominal pain, blood in stool, constipation and diarrhea.  Endocrine: Negative for polydipsia, polyphagia and polyuria.  Genitourinary: Negative for dysuria and frequency.  Musculoskeletal: Positive for arthralgias and myalgias. Negative for back pain and gait problem.    Neurological: Negative for dizziness and headaches.       Objective:   Physical Exam BP 111/81 (BP Location: Left Arm, Patient Position: Sitting, Cuff Size: Large)   Pulse (!) 102   Temp 97.6 F (36.4 C) (Oral)   Resp 18   Ht 5\' 7"  (1.702 m)   Wt 290 lb (131.5 kg)   SpO2 100%   BMI 45.42 kg/m nurses notes and vital signs reviewed General-well-nourished, well-developed obese older female in no acute distress sitting on the exam table.  Patient is accompanied by her daughter at today's visit Neck-supple, no lymphadenopathy Lungs-clear to auscultation bilaterally with mild decreased breath sounds at the lung bases Cardiovascular-regular rate and rhythm Abdomen-truncal obesity, soft and nontender Back-no CVA tenderness Extremities-patient with mild puffiness at the distal lower extremities/ankles but no pitting edema Skin-patient with healing surgical scar right neck/shoulder area status post biopsy    Assessment & Plan:  1. Essential hypertension Patient's blood pressures well controlled at today's visit at [facility].  Patient is currently on losartan hydrochlorothiazide.  Patient will also have blood pressure monitored by oncology and patient may need to have medications adjusted or stopped if she continues to have issues with low blood pressure and/or continued hypokalemia  2. Hypokalemia Patient with hypokalemia with potassium of 3.0 on labs done 06/07/2018 by oncology.  Patient reports that she does have an appointment immediately after today's visit to have labs repeated at oncology.  Patient denies any current muscle cramps and patient/daughter report that patient has been taking a potassium supplement  3. Encounter for long-term (current) use of medications Patient was to have blood work at today's visit in follow-up of her hypokalemia and anemia however patient reports that these have already been scheduled for repeat by oncology for this morning and patient will have labs done at  the cancer center after her visit here today.  4. Dyspnea on exertion Patient with small cell lung cancer for which she is currently undergoing treatment and patient with complaint of shortness of breath with exertion.  Patient will be referred to pulmonology for further evaluation and treatment of her shortness of breath as well as continued treatment per oncology.  I am unsure if patient has any element of heart failure which could be a contributing factor but patient does not have any significant pleural edema and no rales at today's visit but has had a long history of hypertension.  Patient also with some anemia which can also be a contributing factor. - Ambulatory referral to Pulmonology  5. Small cell carcinoma of upper lobe of left lung ([place]) Patient with recently diagnosed small cell carcinoma of the upper lobe of the left lung and patient has been experiencing some shortness of breath.  Patient will be referred to pulmonology for further evaluation and treatment and continue follow-up with oncology. - Ambulatory referral to Pulmonology  An After Visit Summary was printed and given to the patient.  Return  in about 3 months (around 09/08/2018) for HTN and as needed.

## 2018-06-10 NOTE — Telephone Encounter (Signed)
LVM to pick up kdur and start supplement.

## 2018-06-10 NOTE — Anesthesia Preprocedure Evaluation (Addendum)
Anesthesia Evaluation  Patient identified by MRN, date of birth, ID band Patient awake    Reviewed: Allergy & Precautions, NPO status , Patient's Chart, lab work & pertinent test results  Airway Mallampati: I  TM Distance: >3 FB Neck ROM: Full    Dental  (+) Dental Advisory Given, Missing   Pulmonary    Pulmonary exam normal        Cardiovascular hypertension, Pt. on medications Normal cardiovascular exam     Neuro/Psych    GI/Hepatic GERD  Medicated and Controlled,  Endo/Other    Renal/GU      Musculoskeletal   Abdominal   Peds  Hematology   Anesthesia Other Findings   Reproductive/Obstetrics                           Anesthesia Physical Anesthesia Plan  ASA: III  Anesthesia Plan: General   Post-op Pain Management:    Induction: Intravenous  PONV Risk Score and Plan: 3 and Ondansetron and Treatment may vary due to age or medical condition  Airway Management Planned: LMA  Additional Equipment:   Intra-op Plan:   Post-operative Plan: Extubation in OR  Informed Consent: I have reviewed the patients History and Physical, chart, labs and discussed the procedure including the risks, benefits and alternatives for the proposed anesthesia with the patient or authorized representative who has indicated his/her understanding and acceptance.     Dental advisory given  Plan Discussed with: CRNA, Surgeon and Anesthesiologist  Anesthesia Plan Comments: (Hypokalemia, anemia, thrombocytopenia being followed closely by oncology and PCP.)      Anesthesia Quick Evaluation

## 2018-06-12 MED ORDER — VANCOMYCIN HCL 10 G IV SOLR
1500.0000 mg | INTRAVENOUS | Status: AC
  Administered 2018-06-13: 1500 mg via INTRAVENOUS
  Filled 2018-06-12: qty 1500

## 2018-06-13 ENCOUNTER — Ambulatory Visit (HOSPITAL_COMMUNITY)
Admission: RE | Admit: 2018-06-13 | Discharge: 2018-06-13 | Disposition: A | Discharge status: Home / Self Care | Payer: PRIVATE HEALTH INSURANCE | Attending: Thoracic Surgery (Cardiothoracic Vascular Surgery) | Admitting: Thoracic Surgery (Cardiothoracic Vascular Surgery)

## 2018-06-13 ENCOUNTER — Encounter (HOSPITAL_COMMUNITY): Payer: Self-pay

## 2018-06-13 ENCOUNTER — Encounter (HOSPITAL_COMMUNITY)
Admission: RE | Disposition: A | Discharge status: Home / Self Care | Payer: Self-pay | Source: Home / Self Care | Attending: Thoracic Surgery (Cardiothoracic Vascular Surgery)

## 2018-06-13 ENCOUNTER — Ambulatory Visit (HOSPITAL_COMMUNITY): Payer: PRIVATE HEALTH INSURANCE

## 2018-06-13 ENCOUNTER — Ambulatory Visit (HOSPITAL_COMMUNITY): Payer: PRIVATE HEALTH INSURANCE | Admitting: Certified Registered Nurse Anesthetist

## 2018-06-13 ENCOUNTER — Ambulatory Visit (HOSPITAL_COMMUNITY): Payer: PRIVATE HEALTH INSURANCE | Admitting: Physician Assistant

## 2018-06-13 ENCOUNTER — Other Ambulatory Visit: Payer: Self-pay

## 2018-06-13 DIAGNOSIS — I10 Essential (primary) hypertension: Secondary | ICD-10-CM | POA: Insufficient documentation

## 2018-06-13 DIAGNOSIS — C3412 Malignant neoplasm of upper lobe, left bronchus or lung: Secondary | ICD-10-CM

## 2018-06-13 DIAGNOSIS — Z452 Encounter for adjustment and management of vascular access device: Secondary | ICD-10-CM | POA: Insufficient documentation

## 2018-06-13 DIAGNOSIS — Z79899 Other long term (current) drug therapy: Secondary | ICD-10-CM | POA: Insufficient documentation

## 2018-06-13 DIAGNOSIS — Z419 Encounter for procedure for purposes other than remedying health state, unspecified: Secondary | ICD-10-CM

## 2018-06-13 DIAGNOSIS — K219 Gastro-esophageal reflux disease without esophagitis: Secondary | ICD-10-CM | POA: Insufficient documentation

## 2018-06-13 DIAGNOSIS — C3490 Malignant neoplasm of unspecified part of unspecified bronchus or lung: Secondary | ICD-10-CM | POA: Diagnosis not present

## 2018-06-13 DIAGNOSIS — Z01818 Encounter for other preprocedural examination: Secondary | ICD-10-CM

## 2018-06-13 HISTORY — PX: PORTACATH PLACEMENT: SHX2246

## 2018-06-13 SURGERY — INSERTION, TUNNELED CENTRAL VENOUS DEVICE, WITH PORT
Anesthesia: General | Laterality: Left

## 2018-06-13 MED ORDER — LIDOCAINE 2% (20 MG/ML) 5 ML SYRINGE
INTRAMUSCULAR | Status: AC
  Filled 2018-06-13: qty 5

## 2018-06-13 MED ORDER — MEPERIDINE HCL 50 MG/ML IJ SOLN: 6.2500 mg | INTRAMUSCULAR | Status: DC | PRN

## 2018-06-13 MED ORDER — HYDROMORPHONE HCL 1 MG/ML IJ SOLN: 0.2500 mg | INTRAMUSCULAR | Status: DC | PRN

## 2018-06-13 MED ORDER — 0.9 % SODIUM CHLORIDE (POUR BTL) OPTIME
TOPICAL | Status: DC | PRN
  Administered 2018-06-13: 1000 mL

## 2018-06-13 MED ORDER — MIDAZOLAM HCL 2 MG/2ML IJ SOLN
INTRAMUSCULAR | Status: AC
  Filled 2018-06-13: qty 2

## 2018-06-13 MED ORDER — ROCURONIUM BROMIDE 50 MG/5ML IV SOSY
PREFILLED_SYRINGE | INTRAVENOUS | Status: AC
  Filled 2018-06-13: qty 10

## 2018-06-13 MED ORDER — PROPOFOL 10 MG/ML IV BOLUS
INTRAVENOUS | Status: AC
  Filled 2018-06-13: qty 20

## 2018-06-13 MED ORDER — FENTANYL CITRATE (PF) 250 MCG/5ML IJ SOLN
INTRAMUSCULAR | Status: AC
  Filled 2018-06-13: qty 5

## 2018-06-13 MED ORDER — TRAMADOL HCL 50 MG PO TABS: 50.0000 mg | ORAL_TABLET | Freq: Four times a day (QID) | ORAL | 0 refills | Status: DC | PRN

## 2018-06-13 MED ORDER — SUCCINYLCHOLINE CHLORIDE 200 MG/10ML IV SOSY
PREFILLED_SYRINGE | INTRAVENOUS | Status: AC
  Filled 2018-06-13: qty 10

## 2018-06-13 MED ORDER — MIDAZOLAM HCL 5 MG/5ML IJ SOLN
INTRAMUSCULAR | Status: DC | PRN
  Administered 2018-06-13: 2 mg via INTRAVENOUS

## 2018-06-13 MED ORDER — SODIUM CHLORIDE 0.9 % IV SOLN
INTRAVENOUS | Status: AC
  Filled 2018-06-13: qty 1.2

## 2018-06-13 MED ORDER — SODIUM CHLORIDE 0.9 % IV SOLN
INTRAVENOUS | Status: DC | PRN
  Administered 2018-06-13: 500 mL

## 2018-06-13 MED ORDER — LIDOCAINE HCL 1 % IJ SOLN
INTRAMUSCULAR | Status: DC | PRN
  Administered 2018-06-13: 30 mL

## 2018-06-13 MED ORDER — PROPOFOL 10 MG/ML IV BOLUS
INTRAVENOUS | Status: DC | PRN
  Administered 2018-06-13: 150 mg via INTRAVENOUS

## 2018-06-13 MED ORDER — ARTIFICIAL TEARS OPHTHALMIC OINT
TOPICAL_OINTMENT | OPHTHALMIC | Status: AC
  Filled 2018-06-13: qty 3.5

## 2018-06-13 MED ORDER — ONDANSETRON HCL 4 MG/2ML IJ SOLN
INTRAMUSCULAR | Status: AC
  Filled 2018-06-13: qty 2

## 2018-06-13 MED ORDER — ONDANSETRON HCL 4 MG/2ML IJ SOLN: 4.0000 mg | Freq: Once | INTRAMUSCULAR | Status: DC | PRN

## 2018-06-13 MED ORDER — LIDOCAINE HCL (PF) 1 % IJ SOLN
INTRAMUSCULAR | Status: AC
  Filled 2018-06-13: qty 30

## 2018-06-13 MED ORDER — LACTATED RINGERS IV SOLN
INTRAVENOUS | Status: DC
  Administered 2018-06-13: 12:00:00 via INTRAVENOUS

## 2018-06-13 MED ORDER — FENTANYL CITRATE (PF) 100 MCG/2ML IJ SOLN
INTRAMUSCULAR | Status: DC | PRN
  Administered 2018-06-13 (×3): 25 ug via INTRAVENOUS
  Administered 2018-06-13: 50 ug via INTRAVENOUS
  Administered 2018-06-13: 25 ug via INTRAVENOUS

## 2018-06-13 MED ORDER — HEPARIN SODIUM (PORCINE) 1000 UNIT/ML IJ SOLN
INTRAMUSCULAR | Status: AC
  Filled 2018-06-13: qty 1

## 2018-06-13 MED ORDER — HEPARIN SODIUM (PORCINE) 1000 UNIT/ML IJ SOLN
INTRAMUSCULAR | Status: DC | PRN
  Administered 2018-06-13: 10000 [IU] via INTRAVENOUS

## 2018-06-13 MED ORDER — LIDOCAINE 2% (20 MG/ML) 5 ML SYRINGE
INTRAMUSCULAR | Status: DC | PRN
  Administered 2018-06-13: 100 mg via INTRAVENOUS

## 2018-06-13 MED ORDER — PROPOFOL 1000 MG/100ML IV EMUL
INTRAVENOUS | Status: AC
  Filled 2018-06-13: qty 100

## 2018-06-13 MED ORDER — ONDANSETRON HCL 4 MG/2ML IJ SOLN
INTRAMUSCULAR | Status: DC | PRN
  Administered 2018-06-13: 4 mg via INTRAVENOUS

## 2018-06-13 MED ORDER — ROCURONIUM BROMIDE 50 MG/5ML IV SOSY
PREFILLED_SYRINGE | INTRAVENOUS | Status: AC
  Filled 2018-06-13: qty 5

## 2018-06-13 SURGICAL SUPPLY — 40 items
ADH SKN CLS APL DERMABOND .7 (GAUZE/BANDAGES/DRESSINGS) ×1
ADH SKN CLS LQ APL DERMABOND (GAUZE/BANDAGES/DRESSINGS) ×1
BAG DECANTER FOR FLEXI CONT (MISCELLANEOUS) ×3 IMPLANT
CANISTER SUCT 3000ML PPV (MISCELLANEOUS) ×3 IMPLANT
COVER PROBE W GEL 5X96 (DRAPES) ×2 IMPLANT
COVER SURGICAL LIGHT HANDLE (MISCELLANEOUS) ×3 IMPLANT
COVER WAND RF STERILE (DRAPES) ×3 IMPLANT
DERMABOND ADHESIVE PROPEN (GAUZE/BANDAGES/DRESSINGS) ×2
DERMABOND ADVANCED (GAUZE/BANDAGES/DRESSINGS) ×2
DERMABOND ADVANCED .7 DNX12 (GAUZE/BANDAGES/DRESSINGS) ×1 IMPLANT
DERMABOND ADVANCED .7 DNX6 (GAUZE/BANDAGES/DRESSINGS) IMPLANT
DRAPE C-ARM 42X72 X-RAY (DRAPES) ×3 IMPLANT
DRAPE CHEST BREAST 15X10 FENES (DRAPES) ×3 IMPLANT
ELECT REM PT RETURN 9FT ADLT (ELECTROSURGICAL) ×3
ELECTRODE REM PT RTRN 9FT ADLT (ELECTROSURGICAL) ×1 IMPLANT
GLOVE SURG SIGNA 7.5 PF LTX (GLOVE) ×3 IMPLANT
GOWN STRL REUS W/ TWL LRG LVL3 (GOWN DISPOSABLE) ×1 IMPLANT
GOWN STRL REUS W/ TWL XL LVL3 (GOWN DISPOSABLE) ×1 IMPLANT
GOWN STRL REUS W/TWL LRG LVL3 (GOWN DISPOSABLE) ×3
GOWN STRL REUS W/TWL XL LVL3 (GOWN DISPOSABLE) ×3
GUIDEWIRE UNCOATED ST S 7038 (WIRE) IMPLANT
INTRODUCER 13FR (INTRODUCER) IMPLANT
INTRODUCER COOK 11FR (CATHETERS) IMPLANT
KIT BASIN OR (CUSTOM PROCEDURE TRAY) ×3 IMPLANT
KIT PORT POWER 9.6FR MRI PREA (Stent) ×2 IMPLANT
KIT TURNOVER KIT B (KITS) ×3 IMPLANT
NS IRRIG 1000ML POUR BTL (IV SOLUTION) ×3 IMPLANT
PACK GENERAL/GYN (CUSTOM PROCEDURE TRAY) ×3 IMPLANT
PAD ARMBOARD 7.5X6 YLW CONV (MISCELLANEOUS) ×6 IMPLANT
SET SHEATH INTRODUCER 10FR (MISCELLANEOUS) IMPLANT
SUT MNCRL AB 4-0 PS2 18 (SUTURE) IMPLANT
SUT VIC AB 3-0 SH 27 (SUTURE) ×15
SUT VIC AB 3-0 SH 27X BRD (SUTURE) ×2 IMPLANT
SUT VICRYL 4-0 PS2 18IN ABS (SUTURE) ×3 IMPLANT
SYR 20CC LL (SYRINGE) ×3 IMPLANT
SYR 5ML LL (SYRINGE) ×3 IMPLANT
TOWEL GREEN STERILE (TOWEL DISPOSABLE) ×3 IMPLANT
TOWEL GREEN STERILE FF (TOWEL DISPOSABLE) ×3 IMPLANT
WATER STERILE IRR 1000ML POUR (IV SOLUTION) ×3 IMPLANT
WIRE .035 3MM-J 145CM (WIRE) IMPLANT

## 2018-06-13 NOTE — Transfer of Care (Signed)
Immediate Anesthesia Transfer of Care Note  Patient: Samantha Santos  Procedure(s) Performed: INSERTION PORT-A-CATH (Left )  Patient Location: PACU  Anesthesia Type:General  Level of Consciousness: awake and patient cooperative  Airway & Oxygen Therapy: Patient Spontanous Breathing  Post-op Assessment: Report given to RN and Post -op Vital signs reviewed and stable  Post vital signs: Reviewed and stable  Last Vitals:  Vitals Value Taken Time  BP 136/97 06/13/2018  3:45 PM  Temp    Pulse 85 06/13/2018  3:47 PM  Resp 19 06/13/2018  3:47 PM  SpO2 97 % 06/13/2018  3:47 PM  Vitals shown include unvalidated device data.  Last Pain:  Vitals:   06/13/18 1119  TempSrc:   PainSc: 0-No pain         Complications: No apparent anesthesia complications

## 2018-06-13 NOTE — Anesthesia Procedure Notes (Signed)
Procedure Name: LMA Insertion Date/Time: 06/13/2018 2:22 PM Performed by: Lance Coon, CRNA Pre-anesthesia Checklist: Patient identified, Emergency Drugs available, Suction available and Patient being monitored Patient Re-evaluated:Patient Re-evaluated prior to induction Oxygen Delivery Method: Circle system utilized Preoxygenation: Pre-oxygenation with 100% oxygen Induction Type: IV induction Ventilation: Mask ventilation without difficulty LMA Size: 5.0 Number of attempts: 1 Placement Confirmation: positive ETCO2 and breath sounds checked- equal and bilateral Tube secured with: Tape Dental Injury: Teeth and Oropharynx as per pre-operative assessment

## 2018-06-13 NOTE — Interval H&P Note (Signed)
History and Physical Interval Note: 58 yo woman with small cell lung cancer who needs reliable IV access for chemo I discussed the general nature of the procedure, and the incision to be used with Ms. Samantha Santos. We discussed the expected hospital stay, overall recovery and short and long term outcomes. She understands the risks include, but are not limited to bleeding, possible need for transfusion, infection, pneumothorax, catheter malposition, as well as small risks of other more serious complications.  She accepts the risks and agrees to proceed. 06/13/2018 1:47 PM  Samantha Santos  has presented today for surgery, with the diagnosis of LUNG CANCER  The various methods of treatment have been discussed with the patient and family. After consideration of risks, benefits and other options for treatment, the patient has consented to  Procedure(s): INSERTION PORT-A-CATH (Bilateral) as a surgical intervention .  The patient's history has been reviewed, patient examined, no change in status, stable for surgery.  I have reviewed the patient's chart and labs.  Questions were answered to the patient's satisfaction.     Samantha Santos

## 2018-06-13 NOTE — Discharge Instructions (Addendum)
Do not drive or engage in heavy physical activity for 48 hours  You may shower tomorrow  There is a medical adhesive over the incision. It will begin to peel off in about 2 weeks  You may place an ice pack over the incision for 30 minutes 4 times daily  You may use the oxycodone you already have if needed for pain. You may also use acetaminophen (Tylenol)  Follow up as scheduled at the [facility]  My office will contact you with follow up information  Call [phone] if you develop chest pain, shortness of breath, excessive pain, redness, swelling or drainage from the incision or fever > 101 F

## 2018-06-13 NOTE — Anesthesia Postprocedure Evaluation (Signed)
Anesthesia Post Note  Patient: Consepcion Utt  Procedure(s) Performed: INSERTION PORT-A-CATH (Left )     Patient location during evaluation: PACU Anesthesia Type: General Level of consciousness: awake and alert Pain management: pain level controlled Vital Signs Assessment: post-procedure vital signs reviewed and stable Respiratory status: spontaneous breathing, nonlabored ventilation, respiratory function stable and patient connected to nasal cannula oxygen Cardiovascular status: blood pressure returned to baseline and stable Postop Assessment: no apparent nausea or vomiting Anesthetic complications: no    Last Vitals:  Vitals:   06/13/18 1600 06/13/18 1615  BP: (!) 137/94 (!) 136/93  Pulse: 82 82  Resp: 19 (!) 23  Temp:  36.5 C  SpO2: 97% 99%    Last Pain:  Vitals:   06/13/18 1615  TempSrc:   PainSc: 0-No pain                 Tomothy Eddins DAVID

## 2018-06-13 NOTE — Brief Op Note (Signed)
06/13/2018  3:48 PM  PATIENT:  Samantha Santos  58 y.o. female  PRE-OPERATIVE DIAGNOSIS:  LUNG CANCER  POST-OPERATIVE DIAGNOSIS:  LUNG CANCER  PROCEDURE:  Procedure(s): INSERTION PORT-A-CATH (Left) IJ  SURGEON:  Surgeon(s) and Role:    * [name], MD - Primary  PHYSICIAN ASSISTANT:   ASSISTANTS: none   ANESTHESIA:   local and general  EBL:  10 mL   BLOOD ADMINISTERED:none  DRAINS: none   LOCAL MEDICATIONS USED:  LIDOCAINE  and Amount: 15 ml  SPECIMEN:  No Specimen  DISPOSITION OF SPECIMEN:  N/A  COUNTS:  YES  TOURNIQUET:  * No tourniquets in log *  DICTATION: .Other Dictation: Dictation Number -  PLAN OF CARE: Discharge to home after PACU  PATIENT DISPOSITION:  PACU - hemodynamically stable.   Delay start of Pharmacological VTE agent (>24hrs) due to surgical blood loss or risk of bleeding: not applicable

## 2018-06-14 ENCOUNTER — Encounter (HOSPITAL_COMMUNITY): Payer: Self-pay | Admitting: Thoracic Surgery (Cardiothoracic Vascular Surgery)

## 2018-06-14 NOTE — Op Note (Signed)
Samantha Santos, Samantha Santos MEDICAL RECORD [id number] ACCOUNT [id number] DATE OF BIRTH:1960/10/21 FACILITY: [facility] LOCATION: [facility] PHYSICIAN:Ridgely Anastacio Chaya Jan, MD  OPERATIVE REPORT  DATE OF PROCEDURE:  06/13/2018  PREOPERATIVE DIAGNOSIS:  Small cell carcinoma, needs IV access for chemotherapy.  POSTOPERATIVE DIAGNOSIS:  Small cell carcinoma, needs IV access for chemotherapy.  PROCEDURE:  Port-A-Cath placement, left internal jugular vein.  SURGEON:  Modesto Charon, MD  ASSISTANT:  None.  ANESTHESIA:  General.  FINDINGS:  Unable to access subclavian vein. Internal jugular vein accessed using ultrasound guidance. Catheter positioned within the right atrium.  CLINICAL NOTE:  The patient is a 58 year old woman recently diagnosed with small cell lung cancer.  She needs IV access for chemotherapy.  She was advised to undergo Port-A-Cath placement for reliable IV access.  The indications, risks, benefits, and  alternatives were discussed in detail with the patient.  She understood and accepted the risks and agreed to proceed.  DESCRIPTION OF PROCEDURE:  The patient was brought to the operating room on 06/13/2018.  She had induction of general anesthesia and was ventilated via laryngeal mask airway.  Intravenous antibiotics were administered.  The chest was prepped and draped  in the usual sterile fashion.  1% lidocaine was used at the incisions.  A total of 15 mL was used in all.  Initially, the left subclavian fossa in the deltopectoral groove was anesthetized for placement of the port.  The patient was placed in steep  Trendelenburg position.  An attempt was made to access the subclavian vein.  The first 2 passes no blood was encountered, but on the third pass there was arterial blood noted.  The needle was removed.  The decision was made to place the catheter via the  internal jugular vein.  Lidocaine was used to anesthetize the area over the internal jugular vein.  The vein was  identified with ultrasound.  A finder needle was easily passed into the vein.  The vein then was accessed using the Seldinger technique and  a wire was passed into the right atrium.  An incision approximately 5 mm in length was made around the wire.  An incision then was made in the left deltopectoral groove and a pocket was created for placement of the port.  The port was cut to 30 cm in  length and pre-flushed with heparinized saline.  The port then was tunneled from the subclavian incision to the neck incision.  A Peel-Away sheath introducer was advanced over the wire while visualizing with fluoroscopy.  The inner cannula was removed.   The catheter was advanced through the Peel-Away sheath introducer, which was then removed.  The catheter was positioned in the right atrium by fluoroscopy.  There was no resistance to withdrawal of blood through the port and the port flushed easily with  heparinized saline.  The port then was flushed with 2.5 mL of concentrated heparin.  The port was secured to the pectoralis fascia with 3-0 Vicryl sutures in 3 locations.  Final inspection with the fluoroscopy showed the catheter in good position and no  kinking in the catheter throughout its course.  Total fluoroscopy time was less than a minute.  The neck incision was closed with a 4-0 Vicryl subcuticular suture.  The chest incision was closed in 2 layers with a running 3-0 Vicryl subcutaneous suture followed by 4-0 Vicryl subcuticular suture.   Dermabond was applied to both incisions.  The patient was taken from the operating room to the [facility] in good condition.  TN/NUANCE  D:06/13/2018 T:06/14/2018 JOB:[id number]

## 2018-06-17 ENCOUNTER — Telehealth: Payer: Self-pay | Admitting: *Deleted

## 2018-06-17 ENCOUNTER — Inpatient Hospital Stay (HOSPITAL_BASED_OUTPATIENT_CLINIC_OR_DEPARTMENT_OTHER): Payer: Medicaid Other | Admitting: Physician Assistant

## 2018-06-17 ENCOUNTER — Inpatient Hospital Stay: Payer: Medicaid Other | Attending: Internal Medicine

## 2018-06-17 ENCOUNTER — Telehealth: Payer: Self-pay | Admitting: Internal Medicine

## 2018-06-17 ENCOUNTER — Other Ambulatory Visit: Payer: PRIVATE HEALTH INSURANCE

## 2018-06-17 ENCOUNTER — Inpatient Hospital Stay: Payer: Medicaid Other

## 2018-06-17 ENCOUNTER — Encounter: Payer: Self-pay | Admitting: Physician Assistant

## 2018-06-17 ENCOUNTER — Other Ambulatory Visit: Payer: Self-pay

## 2018-06-17 VITALS — BP 121/84 | HR 94

## 2018-06-17 VITALS — BP 121/92 | HR 109 | Temp 97.9°F | Resp 18 | Ht 67.0 in | Wt 287.1 lb

## 2018-06-17 DIAGNOSIS — Z5112 Encounter for antineoplastic immunotherapy: Secondary | ICD-10-CM | POA: Insufficient documentation

## 2018-06-17 DIAGNOSIS — T451X5D Adverse effect of antineoplastic and immunosuppressive drugs, subsequent encounter: Secondary | ICD-10-CM | POA: Diagnosis not present

## 2018-06-17 DIAGNOSIS — C3412 Malignant neoplasm of upper lobe, left bronchus or lung: Secondary | ICD-10-CM

## 2018-06-17 DIAGNOSIS — Z79899 Other long term (current) drug therapy: Secondary | ICD-10-CM | POA: Diagnosis not present

## 2018-06-17 DIAGNOSIS — R5382 Chronic fatigue, unspecified: Secondary | ICD-10-CM

## 2018-06-17 DIAGNOSIS — E876 Hypokalemia: Secondary | ICD-10-CM | POA: Diagnosis not present

## 2018-06-17 DIAGNOSIS — I10 Essential (primary) hypertension: Secondary | ICD-10-CM | POA: Insufficient documentation

## 2018-06-17 DIAGNOSIS — C7A1 Malignant poorly differentiated neuroendocrine tumors: Secondary | ICD-10-CM | POA: Insufficient documentation

## 2018-06-17 DIAGNOSIS — Z5111 Encounter for antineoplastic chemotherapy: Secondary | ICD-10-CM

## 2018-06-17 DIAGNOSIS — D6481 Anemia due to antineoplastic chemotherapy: Secondary | ICD-10-CM | POA: Diagnosis not present

## 2018-06-17 DIAGNOSIS — Z5189 Encounter for other specified aftercare: Secondary | ICD-10-CM | POA: Diagnosis not present

## 2018-06-17 DIAGNOSIS — Z791 Long term (current) use of non-steroidal anti-inflammatories (NSAID): Secondary | ICD-10-CM

## 2018-06-17 LAB — CMP (CANCER CENTER ONLY)
ALT: 14 U/L (ref 0–44)
AST: 8 U/L — ABNORMAL LOW (ref 15–41)
Albumin: 3.4 g/dL — ABNORMAL LOW (ref 3.5–5.0)
Alkaline Phosphatase: 125 U/L (ref 38–126)
Anion gap: 9 (ref 5–15)
BILIRUBIN TOTAL: 0.6 mg/dL (ref 0.3–1.2)
BUN: 16 mg/dL (ref 6–20)
CO2: 29 mmol/L (ref 22–32)
Calcium: 9 mg/dL (ref 8.9–10.3)
Chloride: 104 mmol/L (ref 98–111)
Creatinine: 1.05 mg/dL — ABNORMAL HIGH (ref 0.44–1.00)
GFR, Est AFR Am: >60 mL/min (ref >60)
GFR, Estimated: 59 mL/min — ABNORMAL LOW (ref >60)
Glucose, Bld: 101 mg/dL — ABNORMAL HIGH (ref 70–99)
POTASSIUM: 3.9 mmol/L (ref 3.5–5.1)
Sodium: 142 mmol/L (ref 135–145)
TOTAL PROTEIN: 7.9 g/dL (ref 6.5–8.1)

## 2018-06-17 LAB — CBC WITH DIFFERENTIAL (CANCER CENTER ONLY)
Abs Immature Granulocytes: 0.04 10*3/uL (ref 0.00–0.07)
Basophils Absolute: 0 10*3/uL (ref 0.0–0.1)
Basophils Relative: 0 %
EOS ABS: 0 10*3/uL (ref 0.0–0.5)
Eosinophils Relative: 0 %
HCT: 34 % — ABNORMAL LOW (ref 36.0–46.0)
Hemoglobin: 10.9 g/dL — ABNORMAL LOW (ref 12.0–15.0)
Immature Granulocytes: 1 %
Lymphocytes Relative: 26 %
Lymphs Abs: 1.5 10*3/uL (ref 0.7–4.0)
MCH: 25.7 pg — ABNORMAL LOW (ref 26.0–34.0)
MCHC: 32.1 g/dL (ref 30.0–36.0)
MCV: 80.2 fL (ref 80.0–100.0)
Monocytes Absolute: 0.6 10*3/uL (ref 0.1–1.0)
Monocytes Relative: 11 %
NRBC: 0 % (ref 0.0–0.2)
Neutro Abs: 3.7 10*3/uL (ref 1.7–7.7)
Neutrophils Relative %: 62 %
Platelet Count: 150 10*3/uL (ref 150–400)
RBC: 4.24 MIL/uL (ref 3.87–5.11)
RDW: 19 % — ABNORMAL HIGH (ref 11.5–15.5)
WBC Count: 5.9 10*3/uL (ref 4.0–10.5)

## 2018-06-17 LAB — TSH: TSH: 0.679 u[IU]/mL (ref 0.308–3.960)

## 2018-06-17 MED ORDER — PALONOSETRON HCL INJECTION 0.25 MG/5ML
INTRAVENOUS | Status: AC
  Filled 2018-06-17: qty 5

## 2018-06-17 MED ORDER — SODIUM CHLORIDE 0.9 % IV SOLN
1200.0000 mg | Freq: Once | INTRAVENOUS | Status: AC
  Administered 2018-06-17: 1200 mg via INTRAVENOUS
  Filled 2018-06-17: qty 20

## 2018-06-17 MED ORDER — PALONOSETRON HCL INJECTION 0.25 MG/5ML
0.2500 mg | Freq: Once | INTRAVENOUS | Status: AC
  Administered 2018-06-17: 0.25 mg via INTRAVENOUS

## 2018-06-17 MED ORDER — SODIUM CHLORIDE 0.9 % IV SOLN
736.5000 mg | Freq: Once | INTRAVENOUS | Status: AC
  Administered 2018-06-17: 740 mg via INTRAVENOUS
  Filled 2018-06-17: qty 74

## 2018-06-17 MED ORDER — SODIUM CHLORIDE 0.9 % IV SOLN
Freq: Once | INTRAVENOUS | Status: AC
  Administered 2018-06-17: 13:00:00 via INTRAVENOUS
  Filled 2018-06-17: qty 5

## 2018-06-17 MED ORDER — SODIUM CHLORIDE 0.9 % IV SOLN
Freq: Once | INTRAVENOUS | Status: AC
  Administered 2018-06-17: 13:00:00 via INTRAVENOUS
  Filled 2018-06-17: qty 250

## 2018-06-17 MED ORDER — HEPARIN SOD (PORK) LOCK FLUSH 100 UNIT/ML IV SOLN
500.0000 [IU] | Freq: Once | INTRAVENOUS | Status: AC | PRN
  Administered 2018-06-17: 500 [IU]
  Filled 2018-06-17: qty 5

## 2018-06-17 MED ORDER — SODIUM CHLORIDE 0.9 % IV SOLN
100.0000 mg/m2 | Freq: Once | INTRAVENOUS | Status: AC
  Administered 2018-06-17: 250 mg via INTRAVENOUS
  Filled 2018-06-17: qty 12.5

## 2018-06-17 MED ORDER — SODIUM CHLORIDE 0.9% FLUSH
10.0000 mL | INTRAVENOUS | Status: DC | PRN
  Administered 2018-06-17: 10 mL
  Filled 2018-06-17: qty 10

## 2018-06-17 NOTE — Telephone Encounter (Signed)
Per last LOS patient needed lab appt for today.  Scheduled.  Called patient to advise, no answer therefore left message to arrive early to have done.

## 2018-06-17 NOTE — Telephone Encounter (Signed)
Gave avs and calendar ° °

## 2018-06-17 NOTE — Patient Instructions (Signed)
[facility] Discharge Instructions for Patients Receiving Chemotherapy  Today you received the following chemotherapy agents etopiside; carboplatin; vepesid  To help prevent nausea and vomiting after your treatment, we encourage you to take your nausea medication as directed If you develop nausea and vomiting that is not controlled by your nausea medication, call the clinic.   BELOW ARE SYMPTOMS THAT SHOULD BE REPORTED IMMEDIATELY:  *FEVER GREATER THAN 100.5 F  *CHILLS WITH OR WITHOUT FEVER  NAUSEA AND VOMITING THAT IS NOT CONTROLLED WITH YOUR NAUSEA MEDICATION  *UNUSUAL SHORTNESS OF BREATH  *UNUSUAL BRUISING OR BLEEDING  TENDERNESS IN MOUTH AND THROAT WITH OR WITHOUT PRESENCE OF ULCERS  *URINARY PROBLEMS  *BOWEL PROBLEMS  UNUSUAL RASH Items with * indicate a potential emergency and should be followed up as soon as possible.  Feel free to call the clinic should you have any questions or concerns. The clinic phone number is [phone].  Please show the [facility] at check-in to the Emergency Department and triage nurse.  Atezolizumab injection What is this medicine? ATEZOLIZUMAB (a te zoe LIZ ue mab) is a monoclonal antibody. It is used to treat bladder cancer (urothelial cancer), non-small cell lung cancer, small cell lung cancer, and breast cancer. This medicine may be used for other purposes; ask your health care provider or pharmacist if you have questions. COMMON BRAND NAME(S): Tecentriq What should I tell my health care provider before I take this medicine? They need to know if you have any of these conditions: -diabetes -immune system problems -infection -inflammatory bowel disease -liver disease -lung or breathing disease -lupus -nervous system problems like myasthenia gravis or Guillain-Barre syndrome -organ transplant -an unusual or allergic reaction to atezolizumab, other medicines, foods, dyes, or preservatives -pregnant or trying  to get pregnant -breast-feeding How should I use this medicine? This medicine is for infusion into a vein. It is given by a health care professional in a hospital or clinic setting. A special MedGuide will be given to you before each treatment. Be sure to read this information carefully each time. Talk to your pediatrician regarding the use of this medicine in children. Special care may be needed. Overdosage: If you think you have taken too much of this medicine contact a poison control center or emergency room at once. NOTE: This medicine is only for you. Do not share this medicine with others. What if I miss a dose? It is important not to miss your dose. Call your doctor or health care professional if you are unable to keep an appointment. What may interact with this medicine? Interactions have not been studied. This list may not describe all possible interactions. Give your health care provider a list of all the medicines, herbs, non-prescription drugs, or dietary supplements you use. Also tell them if you smoke, drink alcohol, or use illegal drugs. Some items may interact with your medicine. What should I watch for while using this medicine? Your condition will be monitored carefully while you are receiving this medicine. You may need blood work done while you are taking this medicine. Do not become pregnant while taking this medicine or for at least 5 months after stopping it. Women should inform their doctor if they wish to become pregnant or think they might be pregnant. There is a potential for serious side effects to an unborn child. Talk to your health care professional or pharmacist for more information. Do not breast-feed an infant while taking this medicine or for at least 5 months  after the last dose. What side effects may I notice from receiving this medicine? Side effects that you should report to your doctor or health care professional as soon as possible: -allergic reactions like  skin rash, itching or hives, swelling of the face, lips, or tongue -black, tarry stools -bloody or watery diarrhea -breathing problems -changes in vision -chest pain or chest tightness -chills -facial flushing -fever -headache -signs and symptoms of high blood sugar such as dizziness; dry mouth; dry skin; fruity breath; nausea; stomach pain; increased hunger or thirst; increased urination -signs and symptoms of liver injury like dark yellow or brown urine; general ill feeling or flu-like symptoms; light-colored stools; loss of appetite; nausea; right upper belly pain; unusually weak or tired; yellowing of the eyes or skin -stomach pain -trouble passing urine or change in the amount of urine Side effects that usually do not require medical attention (report to your doctor or health care professional if they continue or are bothersome): -cough -diarrhea -joint pain -muscle pain -muscle weakness -tiredness -weight loss This list may not describe all possible side effects. Call your doctor for medical advice about side effects. You may report side effects to FDA at 1-800-FDA-1088. Where should I keep my medicine? This drug is given in a hospital or clinic and will not be stored at home. NOTE: This sheet is a summary. It may not cover all possible information. If you have questions about this medicine, talk to your doctor, pharmacist, or health care provider.  2019 [employer] Standard (2017-07-06 09:33:38)   Carboplatin injection What is this medicine? CARBOPLATIN (KAR boe pla tin) is a chemotherapy drug. It targets fast dividing cells, like cancer cells, and causes these cells to die. This medicine is used to treat ovarian cancer and many other cancers. This medicine may be used for other purposes; ask your health care provider or pharmacist if you have questions. COMMON BRAND NAME(S): Paraplatin What should I tell my health care provider before I take this medicine? They need to  know if you have any of these conditions: -blood disorders -hearing problems -kidney disease -recent or ongoing radiation therapy -an unusual or allergic reaction to carboplatin, cisplatin, other chemotherapy, other medicines, foods, dyes, or preservatives -pregnant or trying to get pregnant -breast-feeding How should I use this medicine? This drug is usually given as an infusion into a vein. It is administered in a hospital or clinic by a specially trained health care professional. Talk to your pediatrician regarding the use of this medicine in children. Special care may be needed. Overdosage: If you think you have taken too much of this medicine contact a poison control center or emergency room at once. NOTE: This medicine is only for you. Do not share this medicine with others. What if I miss a dose? It is important not to miss a dose. Call your doctor or health care professional if you are unable to keep an appointment. What may interact with this medicine? -medicines for seizures -medicines to increase blood counts like filgrastim, pegfilgrastim, sargramostim -some antibiotics like amikacin, gentamicin, neomycin, streptomycin, tobramycin -vaccines Talk to your doctor or health care professional before taking any of these medicines: -acetaminophen -aspirin -ibuprofen -ketoprofen -naproxen This list may not describe all possible interactions. Give your health care provider a list of all the medicines, herbs, non-prescription drugs, or dietary supplements you use. Also tell them if you smoke, drink alcohol, or use illegal drugs. Some items may interact with your medicine. What should I watch for while using  this medicine? Your condition will be monitored carefully while you are receiving this medicine. You will need important blood work done while you are taking this medicine. This drug may make you feel generally unwell. This is not uncommon, as chemotherapy can affect healthy cells  as well as cancer cells. Report any side effects. Continue your course of treatment even though you feel ill unless your doctor tells you to stop. In some cases, you may be given additional medicines to help with side effects. Follow all directions for their use. Call your doctor or health care professional for advice if you get a fever, chills or sore throat, or other symptoms of a cold or flu. Do not treat yourself. This drug decreases your body's ability to fight infections. Try to avoid being around people who are sick. This medicine may increase your risk to bruise or bleed. Call your doctor or health care professional if you notice any unusual bleeding. Be careful brushing and flossing your teeth or using a toothpick because you may get an infection or bleed more easily. If you have any dental work done, tell your dentist you are receiving this medicine. Avoid taking products that contain aspirin, acetaminophen, ibuprofen, naproxen, or ketoprofen unless instructed by your doctor. These medicines may hide a fever. Do not become pregnant while taking this medicine. Women should inform their doctor if they wish to become pregnant or think they might be pregnant. There is a potential for serious side effects to an unborn child. Talk to your health care professional or pharmacist for more information. Do not breast-feed an infant while taking this medicine. What side effects may I notice from receiving this medicine? Side effects that you should report to your doctor or health care professional as soon as possible: -allergic reactions like skin rash, itching or hives, swelling of the face, lips, or tongue -signs of infection - fever or chills, cough, sore throat, pain or difficulty passing urine -signs of decreased platelets or bleeding - bruising, pinpoint red spots on the skin, black, tarry stools, nosebleeds -signs of decreased red blood cells - unusually weak or tired, fainting spells,  lightheadedness -breathing problems -changes in hearing -changes in vision -chest pain -high blood pressure -low blood counts - This drug may decrease the number of white blood cells, red blood cells and platelets. You may be at increased risk for infections and bleeding. -nausea and vomiting -pain, swelling, redness or irritation at the injection site -pain, tingling, numbness in the hands or feet -problems with balance, talking, walking -trouble passing urine or change in the amount of urine Side effects that usually do not require medical attention (report to your doctor or health care professional if they continue or are bothersome): -hair loss -loss of appetite -metallic taste in the mouth or changes in taste This list may not describe all possible side effects. Call your doctor for medical advice about side effects. You may report side effects to FDA at 1-800-FDA-1088. Where should I keep my medicine? This drug is given in a hospital or clinic and will not be stored at home. NOTE: This sheet is a summary. It may not cover all possible information. If you have questions about this medicine, talk to your doctor, pharmacist, or health care provider.  2019 [employer]/Gold Standard (2007-07-09 14:38:05)   Etoposide, VP-16 injection What is this medicine? ETOPOSIDE, VP-16 (e toe POE side) is a chemotherapy drug. It is used to treat testicular cancer, lung cancer, and other cancers. This medicine may  be used for other purposes; ask your health care provider or pharmacist if you have questions. COMMON BRAND NAME(S): Etopophos, Toposar, VePesid What should I tell my health care provider before I take this medicine? They need to know if you have any of these conditions: -infection -kidney disease -liver disease -low blood counts, like low white cell, platelet, or red cell counts -an unusual or allergic reaction to etoposide, other medicines, foods, dyes, or preservatives -pregnant or  trying to get pregnant -breast-feeding How should I use this medicine? This medicine is for infusion into a vein. It is administered in a hospital or clinic by a specially trained health care professional. Talk to your pediatrician regarding the use of this medicine in children. Special care may be needed. Overdosage: If you think you have taken too much of this medicine contact a poison control center or emergency room at once. NOTE: This medicine is only for you. Do not share this medicine with others. What if I miss a dose? It is important not to miss your dose. Call your doctor or health care professional if you are unable to keep an appointment. What may interact with this medicine? -aspirin -certain medications for seizures like carbamazepine, phenobarbital, phenytoin, valproic acid -cyclosporine -levamisole -warfarin This list may not describe all possible interactions. Give your health care provider a list of all the medicines, herbs, non-prescription drugs, or dietary supplements you use. Also tell them if you smoke, drink alcohol, or use illegal drugs. Some items may interact with your medicine. What should I watch for while using this medicine? Visit your doctor for checks on your progress. This drug may make you feel generally unwell. This is not uncommon, as chemotherapy can affect healthy cells as well as cancer cells. Report any side effects. Continue your course of treatment even though you feel ill unless your doctor tells you to stop. In some cases, you may be given additional medicines to help with side effects. Follow all directions for their use. Call your doctor or health care professional for advice if you get a fever, chills or sore throat, or other symptoms of a cold or flu. Do not treat yourself. This drug decreases your body's ability to fight infections. Try to avoid being around people who are sick. This medicine may increase your risk to bruise or bleed. Call your  doctor or health care professional if you notice any unusual bleeding. Talk to your doctor about your risk of cancer. You may be more at risk for certain types of cancers if you take this medicine. Do not become pregnant while taking this medicine or for at least 6 months after stopping it. Women should inform their doctor if they wish to become pregnant or think they might be pregnant. Women of child-bearing potential will need to have a negative pregnancy test before starting this medicine. There is a potential for serious side effects to an unborn child. Talk to your health care professional or pharmacist for more information. Do not breast-feed an infant while taking this medicine. Men must use a latex condom during sexual contact with a woman while taking this medicine and for at least 4 months after stopping it. A latex condom is needed even if you have had a vasectomy. Contact your doctor right away if your partner becomes pregnant. Do not donate sperm while taking this medicine and for at least 4 months after you stop taking this medicine. Men should inform their doctors if they wish to father a child.  This medicine may lower sperm counts. What side effects may I notice from receiving this medicine? Side effects that you should report to your doctor or health care professional as soon as possible: -allergic reactions like skin rash, itching or hives, swelling of the face, lips, or tongue -low blood counts - this medicine may decrease the number of white blood cells, red blood cells and platelets. You may be at increased risk for infections and bleeding. -signs of infection - fever or chills, cough, sore throat, pain or difficulty passing urine -signs of decreased platelets or bleeding - bruising, pinpoint red spots on the skin, black, tarry stools, blood in the urine -signs of decreased red blood cells - unusually weak or tired, fainting spells, lightheadedness -breathing problems -changes in  vision -mouth or throat sores or ulcers -pain, redness, swelling or irritation at the injection site -pain, tingling, numbness in the hands or feet -redness, blistering, peeling or loosening of the skin, including inside the mouth -seizures -vomiting Side effects that usually do not require medical attention (report to your doctor or health care professional if they continue or are bothersome): -diarrhea -hair loss -loss of appetite -nausea -stomach pain This list may not describe all possible side effects. Call your doctor for medical advice about side effects. You may report side effects to FDA at 1-800-FDA-1088. Where should I keep my medicine? This drug is given in a hospital or clinic and will not be stored at home. NOTE: This sheet is a summary. It may not cover all possible information. If you have questions about this medicine, talk to your doctor, pharmacist, or health care provider.  2019 [employer]/Gold Standard (2015-03-26 11:53:23)

## 2018-06-17 NOTE — Progress Notes (Signed)
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  PRIOR THERAPY: None  CURRENT THERAPY: Carboplatin for AUC of 5 on day 1, etoposide 100 mg/M2 on days 1, 2 and 3 with Neulasta support in addition to [place]. First dose February 10th, 2020. Status post cycle 1.  INTERVAL HISTORY: Samantha Santos 58 y.o. female returns to the clinic today accompanied by her daughter, Sydell Axon.  She completed her first cycle of treatment and tolerated it well with any concerning adverse effects.  She was found to be hypokalemic on prior labs. She was prescribed potassium and has been taking it as prescribed. She recently had a port placed on June 13, 2018.  She had some nausea after the procedure which was managed with Compazine.  Patient reports that she is feeling well today.  Denies any fevers, chills, night sweats, or weight loss.  She reports mild constipation which is successfully managed with colace.  She denies any headache or visual changes.  She denies any skin changes or rashes.  He is here today for evaluation prior to starting cycle #2 today as scheduled.   MEDICAL HISTORY: Past Medical History:  Diagnosis Date  . Anemia   . Arthritis   . GERD (gastroesophageal reflux disease)   . Hypertension   . Morbid obesity ([place]) 05/06/2018  . Supraclavicular adenopathy     ALLERGIES:  is allergic to penicillins and oxycodone.  MEDICATIONS:  Current Outpatient Medications  Medication Sig Dispense Refill  . ibuprofen (ADVIL,MOTRIN) 200 MG tablet Take 600 mg by mouth every 6 (six) hours as needed (for pains).    . lidocaine-prilocaine (EMLA) cream Apply 1 application topically as needed. 30 g 0  . loratadine (CLARITIN) 10 MG tablet Take 10 mg by mouth daily.    Marland Kitchen losartan-hydrochlorothiazide (HYZAAR)  100-12.5 MG tablet Take 1 tablet by mouth daily. To lower blood pressure 30 tablet 4  . Potassium Chloride ER 20 MEQ TBCR Take 20 mEq by mouth daily. 10 tablet 0  . prochlorperazine (COMPAZINE) 10 MG tablet Take 1 tablet (10 mg total) by mouth every 6 (six) hours as needed for nausea or vomiting. 30 tablet 0  . traMADol (ULTRAM) 50 MG tablet Take 1 tablet (50 mg total) by mouth every 6 (six) hours as needed. 20 tablet 0   No current facility-administered medications for this visit.     SURGICAL HISTORY:  Past Surgical History:  Procedure Laterality Date  . CESAREAN SECTION  1988  . HYSTEROSCOPY W/D&C  06/14/2011   Procedure: DILATATION AND CURETTAGE /HYSTEROSCOPY;  Surgeon: Frederico Hamman, MD;  Location: [facility];  Service: Gynecology;  Laterality: N/A;  . PORTACATH PLACEMENT Left 06/13/2018   Procedure: INSERTION PORT-A-CATH;  Surgeon: [name], MD;  Location: [facility];  Service: Thoracic;  Laterality: Left;  . SUPRACLAVICAL NODE BIOPSY Right 05/10/2018   Procedure: SUPRACLAVICAL NODE BIOPSY;  Surgeon: [name], MD;  Location: [facility];  Service: Thoracic;  Laterality: Right;  . TUBAL LIGATION      REVIEW OF SYSTEMS:   Review of Systems  Constitutional: Negative for appetite change, chills, fatigue, fever and unexpected weight change.  HENT:   Negative for mouth sores, nosebleeds, sore throat and trouble swallowing.   Eyes: Negative for eye problems and icterus.  Respiratory: Negative for cough, hemoptysis, shortness of breath  and wheezing.   Cardiovascular: Negative for chest pain and leg swelling.  Gastrointestinal: Negative for abdominal pain, constipation, diarrhea, nausea and vomiting.  Genitourinary: Negative for bladder incontinence, difficulty urinating, dysuria, frequency and hematuria.   Musculoskeletal: Negative for back pain, gait problem, neck pain and neck stiffness.  Skin: Negative for itching and rash.  Neurological: Negative for dizziness,  extremity weakness, gait problem, headaches, light-headedness and seizures.  Hematological: Negative for adenopathy. Does not bruise/bleed easily.  Psychiatric/Behavioral: Negative for confusion, depression and sleep disturbance. The patient is not nervous/anxious.     PHYSICAL EXAMINATION:  Blood pressure (!) 121/92, pulse (!) 109, temperature 97.9 F (36.6 C), temperature source Oral, resp. rate 18, height 5\' 7"  (1.702 m), weight 287 lb 1.6 oz (130.2 kg), SpO2 100 %.  ECOG PERFORMANCE STATUS: 1 - Symptomatic but completely ambulatory  Physical Exam  Constitutional: Oriented to person, place, and time and well-developed, well-nourished, and in no distress. No distress.  HENT:  Head: Normocephalic and atraumatic.  Mouth/Throat: Oropharynx is clear and moist. No oropharyngeal exudate.  Eyes: Conjunctivae are normal. Right eye exhibits no discharge. Left eye exhibits no discharge. No scleral icterus.  Neck: Normal range of motion. Neck supple.  Cardiovascular: Normal rate, regular rhythm, normal heart sounds and intact distal pulses.   Pulmonary/Chest: Effort normal. Absent breath sounds in lower left lobe. No respiratory distress. No wheezes. No rales.  Abdominal: Soft. Bowel sounds are normal. Exhibits no distension and no mass. There is no tenderness.  Musculoskeletal: Normal range of motion. Exhibits no edema.  Lymphadenopathy:    No cervical adenopathy.  Neurological: Alert and oriented to person, place, and time. Exhibits normal muscle tone. Gait normal. Coordination normal.  Skin: Skin is warm and dry. No rash noted. Not diaphoretic. No erythema. No pallor.  Psychiatric: Mood, memory and judgment normal.  Vitals reviewed.  LABORATORY DATA: Lab Results  Component Value Date   WBC 5.9 06/17/2018   HGB 10.9 (L) 06/17/2018   HCT 34.0 (L) 06/17/2018   MCV 80.2 06/17/2018   PLT 150 06/17/2018      Chemistry      Component Value Date/Time   NA 142 06/17/2018 1109   NA 144  04/08/2018 1145   K 3.9 06/17/2018 1109   CL 104 06/17/2018 1109   CO2 29 06/17/2018 1109   BUN 16 06/17/2018 1109   BUN 10 04/08/2018 1145   CREATININE 1.05 (H) 06/17/2018 1109      Component Value Date/Time   CALCIUM 9.0 06/17/2018 1109   ALKPHOS 125 06/17/2018 1109   AST 8 (L) 06/17/2018 1109   ALT 14 06/17/2018 1109   BILITOT 0.6 06/17/2018 1109       RADIOGRAPHIC STUDIES:  Dg Chest 2 View  Result Date: 06/13/2018 CLINICAL DATA:  Preoperative radiograph. EXAM: CHEST - 2 VIEW COMPARISON:  05/10/2018 FINDINGS: Enlarged cardiac silhouette. Stable mediastinal lymphadenopathy. Stable eventration of the left hemidiaphragm with left lower lobe subsegmental atelectasis. There is no evidence of focal airspace consolidation, pleural effusion or pneumothorax. Osseous structures are without acute abnormality. Soft tissues are grossly normal. IMPRESSION: 1. Stable mediastinal lymphadenopathy. 2. Stable eventration of the left hemidiaphragm with left lower lobe subsegmental atelectasis. 3. Enlarged cardiac silhouette. Electronically Signed   By: Fidela Salisbury M.D.   On: 06/13/2018 12:18   Mr Jeri Cos BM Contrast  Result Date: 06/04/2018 CLINICAL DATA:  Small cell carcinoma.  Staging. EXAM: MRI HEAD WITHOUT AND WITH CONTRAST TECHNIQUE: Multiplanar, multiecho pulse sequences of the brain and surrounding structures  were obtained without and with intravenous contrast. CONTRAST:  10 cc Gadavist COMPARISON:  Head CT 09/08/2009 FINDINGS: Brain: Incidental mild chronic Chiari malformation with tonsillar extension through the foramen magnum of as much as 1 cm. Otherwise, the brain appears normal without evidence of atrophy, old or acute infarction, mass lesion, hemorrhage, hydrocephalus or extra-axial collection. No abnormal enhancement. No metastatic disease. Vascular: Major vessels at the base of the brain show flow. Skull and upper cervical spine: Negative Sinuses/Orbits: Large retention cyst nearly  filling the right maxillary sinus. The other sinuses are clear. Orbits negative. Other: None IMPRESSION: No evidence of metastatic disease. Normal appearance of the brain, with exception of a minor incidental congenital Chiari malformation. Electronically Signed   By: Nelson Chimes M.D.   On: 06/04/2018 09:22   Dg Chest Port 1 View  Result Date: 06/13/2018 CLINICAL DATA:  Port-A-Cath placement EXAM: PORTABLE CHEST 1 VIEW COMPARISON:  06/13/2018 FINDINGS: Left internal jugular Port-A-Cath has been placed. The tip is in the right atrium approximately 5 cm deep to the cavoatrial junction. There are low lung volumes. Cardiomegaly with vascular congestion. Left lower lobe atelectasis or infiltrate. No pneumothorax. No acute bony abnormality. IMPRESSION: Cardiomegaly, vascular congestion. Left lower lobe atelectasis or infiltrate. Low lung volumes. Left Port-A-Cath tip in the right atrium approximately 5 cm deep to the cavoatrial junction. No pneumothorax. Electronically Signed   By: Rolm Baptise M.D.   On: 06/13/2018 16:29   Dg Fluoro Guide Cv Line-no Report  Result Date: 06/13/2018 Fluoroscopy was utilized by the requesting physician.  No radiographic interpretation.     ASSESSMENT/PLAN:  The patient is a very pleasant 58 year old never smoker African-American female recently diagnosed with stage IV (T2AN3, M1C) high-grade neuroendocrine tumor with focal areas of small cell carcinoma.  She was diagnosed in January 2020. The patient is currently undergoing treatment with carboplatin for an AUC of 5 on day 1, etoposide 100 mg/m on days 1, 2, and 3 in addition to Neulasta support as well as Tecentriq.  She is status post 1 cycle. The patient was seen with Dr. Julien Nordmann today.  The patient continues to tolerate treatment well without any adverse effects.  Recommend she proceed with cycle 2 today as scheduled She had a brain MRI recently to complete the staging work-up. The scan was personally and independently  reviewed by Dr. Julien Nordmann.  Results were discussed with the patient and her daughter which did not show any evidence of brain metastasis.  Labs were reviewed with the patient.  She previously was found to be hypokalemic.  Potassium today was normal at 3.9.  Continue to monitor her labs weekly. I recommend that she proceed with cycle #2 today as scheduled. I have arranged for a restaging CT chest, abdomen, and pelvis to be completed before the next appointment. We will see her for evaluation to discuss the CT results in 3 weeks prior to starting cycle #3.  The patient was advised to call immediately if she has any concerning symptoms in the interval. The patient voices understanding of current disease status and treatment options and is in agreement with the current care plan. All questions were answered. The patient knows to call the clinic with any problems, questions or concerns. We can certainly see the patient much sooner if necessary  Orders Placed This Encounter  Procedures  . CT Abdomen Pelvis W Contrast    Standing Status:   Future    Standing Expiration Date:   06/17/2019    Order Specific Question:   **  REASON FOR EXAM (FREE TEXT)    Answer:   Restaging Small Cell Lung Cancer    Order Specific Question:   If indicated for the ordered procedure, I authorize the administration of contrast media per Radiology protocol    Answer:   Yes    Order Specific Question:   Is patient pregnant?    Answer:   No    Order Specific Question:   Preferred imaging location?    Answer:   [facility]    Order Specific Question:   Is Oral Contrast requested for this exam?    Answer:   Yes, Per Radiology protocol    Order Specific Question:   Radiology Contrast Protocol - do NOT remove file path    Answer:   \\[email or web address]  . CT Chest W Contrast    Standing Status:   Future    Standing Expiration Date:   06/17/2019    Order Specific Question:   ** REASON FOR EXAM (FREE  TEXT)    Answer:   Restaging Small Cell Lung Cancer    Order Specific Question:   If indicated for the ordered procedure, I authorize the administration of contrast media per Radiology protocol    Answer:   Yes    Order Specific Question:   Is patient pregnant?    Answer:   No    Order Specific Question:   Preferred imaging location?    Answer:   [facility]    Order Specific Question:   Radiology Contrast Protocol - do NOT remove file path    Answer:   \\[email or web address]      L , PA-C 06/17/18  ADDENDUM: Hematology/Oncology Attending: I had a face-to-face encounter with the patient.  I recommended her care plan.  This is a very pleasant 58 years old African-American female recently diagnosed with a stage IV high-grade neuroendocrine carcinoma with focal areas of small cell carcinoma diagnosed in January 2020.  The patient is currently undergoing systemic chemotherapy with carboplatin and etoposide status post 1 cycle.  She tolerated the first cycle of her treatment well except for mild fatigue. She had a Port-A-Cath placed recently for IV access. I recommended for the patient to proceed with cycle #2 today as scheduled. We will see her back for follow-up visit in 3 weeks for evaluation after repeating CT scan of the chest, abdomen and pelvis for restaging of her disease. The patient was advised to call immediately if she has any concerning symptoms in the interval.  Disclaimer: This note was dictated with voice recognition software. Similar sounding words can inadvertently be transcribed and may be missed upon review. Eilleen Kempf, MD 06/17/18

## 2018-06-18 ENCOUNTER — Inpatient Hospital Stay: Payer: Medicaid Other

## 2018-06-18 VITALS — BP 129/81 | HR 91 | Temp 98.0°F | Resp 18

## 2018-06-18 DIAGNOSIS — Z5112 Encounter for antineoplastic immunotherapy: Secondary | ICD-10-CM | POA: Diagnosis not present

## 2018-06-18 DIAGNOSIS — C3412 Malignant neoplasm of upper lobe, left bronchus or lung: Secondary | ICD-10-CM

## 2018-06-18 MED ORDER — SODIUM CHLORIDE 0.9 % IV SOLN
100.0000 mg/m2 | Freq: Once | INTRAVENOUS | Status: AC
  Administered 2018-06-18: 250 mg via INTRAVENOUS
  Filled 2018-06-18: qty 12.5

## 2018-06-18 MED ORDER — DEXAMETHASONE SODIUM PHOSPHATE 10 MG/ML IJ SOLN
10.0000 mg | Freq: Once | INTRAMUSCULAR | Status: AC
  Administered 2018-06-18: 10 mg via INTRAVENOUS

## 2018-06-18 MED ORDER — SODIUM CHLORIDE 0.9 % IV SOLN
Freq: Once | INTRAVENOUS | Status: AC
  Administered 2018-06-18: 09:00:00 via INTRAVENOUS
  Filled 2018-06-18: qty 250

## 2018-06-18 MED ORDER — HEPARIN SOD (PORK) LOCK FLUSH 100 UNIT/ML IV SOLN
500.0000 [IU] | Freq: Once | INTRAVENOUS | Status: AC | PRN
  Administered 2018-06-18: 500 [IU]
  Filled 2018-06-18: qty 5

## 2018-06-18 MED ORDER — DEXAMETHASONE SODIUM PHOSPHATE 10 MG/ML IJ SOLN
INTRAMUSCULAR | Status: AC
  Filled 2018-06-18: qty 1

## 2018-06-18 MED ORDER — SODIUM CHLORIDE 0.9% FLUSH
10.0000 mL | INTRAVENOUS | Status: DC | PRN
  Administered 2018-06-18: 10 mL
  Filled 2018-06-18: qty 10

## 2018-06-18 NOTE — Patient Instructions (Signed)
[facility] Discharge Instructions for Patients Receiving Chemotherapy  Today you received the following chemotherapy agents :  Etoposide.  To help prevent nausea and vomiting after your treatment, we encourage you to take your nausea medication as prescribed.   If you develop nausea and vomiting that is not controlled by your nausea medication, call the clinic.   BELOW ARE SYMPTOMS THAT SHOULD BE REPORTED IMMEDIATELY:  *FEVER GREATER THAN 100.5 F  *CHILLS WITH OR WITHOUT FEVER  NAUSEA AND VOMITING THAT IS NOT CONTROLLED WITH YOUR NAUSEA MEDICATION  *UNUSUAL SHORTNESS OF BREATH  *UNUSUAL BRUISING OR BLEEDING  TENDERNESS IN MOUTH AND THROAT WITH OR WITHOUT PRESENCE OF ULCERS  *URINARY PROBLEMS  *BOWEL PROBLEMS  UNUSUAL RASH Items with * indicate a potential emergency and should be followed up as soon as possible.  Feel free to call the clinic should you have any questions or concerns. The clinic phone number is [phone].  Please show the [facility] at check-in to the Emergency Department and triage nurse.

## 2018-06-19 ENCOUNTER — Inpatient Hospital Stay: Payer: Medicaid Other

## 2018-06-19 VITALS — BP 123/87 | HR 90 | Temp 98.2°F | Resp 18

## 2018-06-19 DIAGNOSIS — Z5112 Encounter for antineoplastic immunotherapy: Secondary | ICD-10-CM | POA: Diagnosis not present

## 2018-06-19 DIAGNOSIS — C3412 Malignant neoplasm of upper lobe, left bronchus or lung: Secondary | ICD-10-CM

## 2018-06-19 MED ORDER — PEGFILGRASTIM 6 MG/0.6ML ~~LOC~~ PSKT
PREFILLED_SYRINGE | SUBCUTANEOUS | Status: AC
  Filled 2018-06-19: qty 0.6

## 2018-06-19 MED ORDER — SODIUM CHLORIDE 0.9% FLUSH
10.0000 mL | INTRAVENOUS | Status: DC | PRN
  Administered 2018-06-19: 10 mL
  Filled 2018-06-19: qty 10

## 2018-06-19 MED ORDER — HEPARIN SOD (PORK) LOCK FLUSH 100 UNIT/ML IV SOLN
500.0000 [IU] | Freq: Once | INTRAVENOUS | Status: AC | PRN
  Administered 2018-06-19: 500 [IU]
  Filled 2018-06-19: qty 5

## 2018-06-19 MED ORDER — SODIUM CHLORIDE 0.9 % IV SOLN
100.0000 mg/m2 | Freq: Once | INTRAVENOUS | Status: AC
  Administered 2018-06-19: 250 mg via INTRAVENOUS
  Filled 2018-06-19: qty 12.5

## 2018-06-19 MED ORDER — SODIUM CHLORIDE 0.9 % IV SOLN
Freq: Once | INTRAVENOUS | Status: AC
  Administered 2018-06-19: 09:00:00 via INTRAVENOUS
  Filled 2018-06-19: qty 250

## 2018-06-19 MED ORDER — PEGFILGRASTIM 6 MG/0.6ML ~~LOC~~ PSKT
6.0000 mg | PREFILLED_SYRINGE | Freq: Once | SUBCUTANEOUS | Status: AC
  Administered 2018-06-19: 6 mg via SUBCUTANEOUS

## 2018-06-19 MED ORDER — SODIUM CHLORIDE 0.9 % IV SOLN
100.0000 mg/m2 | Freq: Once | INTRAVENOUS | Status: DC
  Filled 2018-06-19: qty 12.5

## 2018-06-19 MED ORDER — DEXAMETHASONE SODIUM PHOSPHATE 10 MG/ML IJ SOLN
10.0000 mg | Freq: Once | INTRAMUSCULAR | Status: AC
  Administered 2018-06-19: 10 mg via INTRAVENOUS

## 2018-06-19 MED ORDER — DEXAMETHASONE SODIUM PHOSPHATE 10 MG/ML IJ SOLN
INTRAMUSCULAR | Status: AC
  Filled 2018-06-19: qty 1

## 2018-06-20 ENCOUNTER — Emergency Department (HOSPITAL_COMMUNITY): Payer: Medicaid Other

## 2018-06-20 ENCOUNTER — Inpatient Hospital Stay (HOSPITAL_COMMUNITY)
Admission: EM | Admit: 2018-06-20 | Discharge: 2018-06-25 | DRG: 871 | Disposition: A | Discharge status: Home / Self Care | Payer: Medicaid Other | Attending: Nephrology | Admitting: Nephrology

## 2018-06-20 ENCOUNTER — Other Ambulatory Visit: Payer: Self-pay

## 2018-06-20 ENCOUNTER — Encounter (HOSPITAL_COMMUNITY): Payer: Self-pay | Admitting: Internal Medicine

## 2018-06-20 DIAGNOSIS — D72819 Decreased white blood cell count, unspecified: Secondary | ICD-10-CM | POA: Diagnosis present

## 2018-06-20 DIAGNOSIS — D6481 Anemia due to antineoplastic chemotherapy: Secondary | ICD-10-CM | POA: Diagnosis present

## 2018-06-20 DIAGNOSIS — N179 Acute kidney failure, unspecified: Secondary | ICD-10-CM | POA: Diagnosis present

## 2018-06-20 DIAGNOSIS — J189 Pneumonia, unspecified organism: Secondary | ICD-10-CM | POA: Diagnosis present

## 2018-06-20 DIAGNOSIS — N182 Chronic kidney disease, stage 2 (mild): Secondary | ICD-10-CM | POA: Diagnosis present

## 2018-06-20 DIAGNOSIS — Z8249 Family history of ischemic heart disease and other diseases of the circulatory system: Secondary | ICD-10-CM

## 2018-06-20 DIAGNOSIS — R6520 Severe sepsis without septic shock: Secondary | ICD-10-CM | POA: Diagnosis present

## 2018-06-20 DIAGNOSIS — N183 Chronic kidney disease, stage 3 (moderate): Secondary | ICD-10-CM | POA: Diagnosis present

## 2018-06-20 DIAGNOSIS — Z833 Family history of diabetes mellitus: Secondary | ICD-10-CM

## 2018-06-20 DIAGNOSIS — C3492 Malignant neoplasm of unspecified part of left bronchus or lung: Secondary | ICD-10-CM

## 2018-06-20 DIAGNOSIS — I959 Hypotension, unspecified: Secondary | ICD-10-CM

## 2018-06-20 DIAGNOSIS — R6510 Systemic inflammatory response syndrome (SIRS) of non-infectious origin without acute organ dysfunction: Secondary | ICD-10-CM | POA: Diagnosis present

## 2018-06-20 DIAGNOSIS — I10 Essential (primary) hypertension: Secondary | ICD-10-CM | POA: Diagnosis present

## 2018-06-20 DIAGNOSIS — A419 Sepsis, unspecified organism: Principal | ICD-10-CM | POA: Diagnosis present

## 2018-06-20 DIAGNOSIS — D6959 Other secondary thrombocytopenia: Secondary | ICD-10-CM | POA: Diagnosis present

## 2018-06-20 DIAGNOSIS — C3412 Malignant neoplasm of upper lobe, left bronchus or lung: Secondary | ICD-10-CM | POA: Diagnosis present

## 2018-06-20 DIAGNOSIS — Z823 Family history of stroke: Secondary | ICD-10-CM

## 2018-06-20 DIAGNOSIS — I129 Hypertensive chronic kidney disease with stage 1 through stage 4 chronic kidney disease, or unspecified chronic kidney disease: Secondary | ICD-10-CM | POA: Diagnosis present

## 2018-06-20 DIAGNOSIS — Z789 Other specified health status: Secondary | ICD-10-CM

## 2018-06-20 DIAGNOSIS — K219 Gastro-esophageal reflux disease without esophagitis: Secondary | ICD-10-CM | POA: Diagnosis present

## 2018-06-20 DIAGNOSIS — T451X5A Adverse effect of antineoplastic and immunosuppressive drugs, initial encounter: Secondary | ICD-10-CM | POA: Diagnosis present

## 2018-06-20 LAB — CBC WITH DIFFERENTIAL/PLATELET
Abs Immature Granulocytes: 0.71 10*3/uL — ABNORMAL HIGH (ref 0.00–0.07)
Basophils Absolute: 0 10*3/uL (ref 0.0–0.1)
Basophils Relative: 0 %
Eosinophils Absolute: 0.1 10*3/uL (ref 0.0–0.5)
Eosinophils Relative: 0 %
HCT: 38.2 % (ref 36.0–46.0)
Hemoglobin: 12.2 g/dL (ref 12.0–15.0)
IMMATURE GRANULOCYTES: 3 %
Lymphocytes Relative: 4 %
Lymphs Abs: 0.9 10*3/uL (ref 0.7–4.0)
MCH: 25.8 pg — ABNORMAL LOW (ref 26.0–34.0)
MCHC: 31.9 g/dL (ref 30.0–36.0)
MCV: 80.8 fL (ref 80.0–100.0)
MONOS PCT: 0 %
Monocytes Absolute: 0.1 10*3/uL (ref 0.1–1.0)
NEUTROS PCT: 93 %
Neutro Abs: 21.8 10*3/uL — ABNORMAL HIGH (ref 1.7–7.7)
PLATELETS: 231 10*3/uL (ref 150–400)
RBC: 4.73 MIL/uL (ref 3.87–5.11)
RDW: 19.7 % — ABNORMAL HIGH (ref 11.5–15.5)
WBC: 23.6 10*3/uL — ABNORMAL HIGH (ref 4.0–10.5)
nRBC: 0.1 % (ref 0.0–0.2)

## 2018-06-20 LAB — COMPREHENSIVE METABOLIC PANEL
ALT: 12 U/L (ref 0–44)
AST: 16 U/L (ref 15–41)
Albumin: 2.7 g/dL — ABNORMAL LOW (ref 3.5–5.0)
Alkaline Phosphatase: 96 U/L (ref 38–126)
Anion gap: 9 (ref 5–15)
BILIRUBIN TOTAL: 1.2 mg/dL (ref 0.3–1.2)
BUN: 24 mg/dL — ABNORMAL HIGH (ref 6–20)
CO2: 23 mmol/L (ref 22–32)
Calcium: 7.8 mg/dL — ABNORMAL LOW (ref 8.9–10.3)
Chloride: 106 mmol/L (ref 98–111)
Creatinine, Ser: 1.29 mg/dL — ABNORMAL HIGH (ref 0.44–1.00)
GFR calc Af Amer: 53 mL/min — ABNORMAL LOW (ref >60)
GFR calc non Af Amer: 46 mL/min — ABNORMAL LOW (ref >60)
Glucose, Bld: 107 mg/dL — ABNORMAL HIGH (ref 70–99)
POTASSIUM: 3.9 mmol/L (ref 3.5–5.1)
Sodium: 138 mmol/L (ref 135–145)
Total Protein: 6.1 g/dL — ABNORMAL LOW (ref 6.5–8.1)

## 2018-06-20 LAB — TROPONIN I
Troponin I: 0.04 ng/mL (ref <0.03)
Troponin I: 0.06 ng/mL (ref <0.03)

## 2018-06-20 LAB — INFLUENZA PANEL BY PCR (TYPE A & B)
INFLAPCR: NEGATIVE
Influenza B By PCR: NEGATIVE

## 2018-06-20 LAB — LIPASE, BLOOD: Lipase: 26 U/L (ref 11–51)

## 2018-06-20 LAB — LACTIC ACID, PLASMA
LACTIC ACID, VENOUS: 2.5 mmol/L — AB (ref 0.5–1.9)
Lactic Acid, Venous: 1.7 mmol/L (ref 0.5–1.9)
Lactic Acid, Venous: 1.8 mmol/L (ref 0.5–1.9)

## 2018-06-20 MED ORDER — VANCOMYCIN HCL 10 G IV SOLR
2500.0000 mg | Freq: Once | INTRAVENOUS | Status: AC
  Administered 2018-06-20: 2500 mg via INTRAVENOUS
  Filled 2018-06-20: qty 2500

## 2018-06-20 MED ORDER — ONDANSETRON HCL 4 MG PO TABS: 4.0000 mg | ORAL_TABLET | Freq: Four times a day (QID) | ORAL | Status: DC | PRN

## 2018-06-20 MED ORDER — ACETAMINOPHEN 325 MG PO TABS
650.0000 mg | ORAL_TABLET | Freq: Four times a day (QID) | ORAL | Status: DC | PRN
  Administered 2018-06-22: 650 mg via ORAL
  Filled 2018-06-20: qty 2

## 2018-06-20 MED ORDER — SODIUM CHLORIDE 0.9 % IV SOLN
2.0000 g | Freq: Once | INTRAVENOUS | Status: AC
  Administered 2018-06-20: 2 g via INTRAVENOUS
  Filled 2018-06-20 (×2): qty 2

## 2018-06-20 MED ORDER — VANCOMYCIN HCL 10 G IV SOLR
1500.0000 mg | INTRAVENOUS | Status: DC
  Administered 2018-06-21 – 2018-06-23 (×3): 1500 mg via INTRAVENOUS
  Filled 2018-06-20 (×4): qty 1500

## 2018-06-20 MED ORDER — SODIUM CHLORIDE 0.9 % IV SOLN
2.0000 g | Freq: Two times a day (BID) | INTRAVENOUS | Status: DC
  Administered 2018-06-21: 2 g via INTRAVENOUS
  Filled 2018-06-20 (×2): qty 2

## 2018-06-20 MED ORDER — METRONIDAZOLE IN NACL 5-0.79 MG/ML-% IV SOLN
500.0000 mg | Freq: Three times a day (TID) | INTRAVENOUS | Status: DC
  Administered 2018-06-20 – 2018-06-23 (×8): 500 mg via INTRAVENOUS
  Filled 2018-06-20 (×8): qty 100

## 2018-06-20 MED ORDER — ACETAMINOPHEN 650 MG RE SUPP: 650.0000 mg | Freq: Four times a day (QID) | RECTAL | Status: DC | PRN

## 2018-06-20 MED ORDER — ENOXAPARIN SODIUM 40 MG/0.4ML ~~LOC~~ SOLN
40.0000 mg | SUBCUTANEOUS | Status: DC
  Administered 2018-06-20 – 2018-06-24 (×5): 40 mg via SUBCUTANEOUS
  Filled 2018-06-20 (×5): qty 0.4

## 2018-06-20 MED ORDER — SODIUM CHLORIDE 0.9 % IV SOLN
Freq: Once | INTRAVENOUS | Status: AC
  Administered 2018-06-20: 19:00:00 via INTRAVENOUS

## 2018-06-20 MED ORDER — VANCOMYCIN HCL IN DEXTROSE 1-5 GM/200ML-% IV SOLN: 1000.0000 mg | Freq: Once | INTRAVENOUS | Status: DC

## 2018-06-20 MED ORDER — ONDANSETRON HCL 4 MG/2ML IJ SOLN: 4.0000 mg | Freq: Four times a day (QID) | INTRAMUSCULAR | Status: DC | PRN

## 2018-06-20 MED ORDER — SODIUM CHLORIDE 0.9 % IV SOLN
INTRAVENOUS | Status: AC
  Administered 2018-06-20 – 2018-06-21 (×5): via INTRAVENOUS

## 2018-06-20 MED ORDER — SODIUM CHLORIDE 0.9 % IV BOLUS
1000.0000 mL | Freq: Once | INTRAVENOUS | Status: AC
  Administered 2018-06-20: 1000 mL via INTRAVENOUS

## 2018-06-20 NOTE — ED Provider Notes (Signed)
Emergency Department Provider Note   I have reviewed the triage vital signs and the nursing notes.   HISTORY  Chief Complaint No chief complaint on file.   HPI Samantha Santos is a 58 y.o. 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  The patient began having symptoms this morning.  She was driving to her grandsons school when she came lightheaded and confused.  She got lost not feel well.  She continued to have symptoms throughout the morning with lightheadedness mainly with movement or standing.  She did feel short of breath and had a near syncope event at home.  Ultimately EMS was called and report finding the patient to be hypotensive with systolic pressures in the 70s and tachycardia.  He denies any chest pain.  She has been eating and drinking reasonably well.  She did have chemotherapy yesterday (cycle 2).  She recently had a port placed on the left and not noticed any redness or swelling around the area.  She is not having abdominal pain, vomiting, diarrhea.  No back pain.  No UTI symptoms.  She has not noticed any blood in the bowel movements. Denies fever.    Past Medical History:  Diagnosis Date  . Anemia   . Arthritis   . GERD (gastroesophageal reflux disease)   . Hypertension   . Morbid obesity ([place]) 05/06/2018  . Supraclavicular adenopathy     Patient Active Problem List   Diagnosis Date Noted  . SIRS (systemic inflammatory response syndrome) ([place]) 06/20/2018  . Small cell carcinoma of upper lobe of left lung ([place]) 05/20/2018  . Goals of care, counseling/discussion 05/20/2018  . Encounter for antineoplastic chemotherapy 05/20/2018  . Encounter for antineoplastic immunotherapy 05/20/2018  . Morbid obesity ([place]) 05/06/2018  . Essential hypertension 04/08/2018  . Chest pain, atypical 04/08/2018  . Abnormal chest  CT 04/08/2018    Past Surgical History:  Procedure Laterality Date  . CESAREAN SECTION  1988  . HYSTEROSCOPY W/D&C  06/14/2011   Procedure: DILATATION AND CURETTAGE /HYSTEROSCOPY;  Surgeon: Frederico Hamman, MD;  Location: [facility];  Service: Gynecology;  Laterality: N/A;  . PORTACATH PLACEMENT Left 06/13/2018   Procedure: INSERTION PORT-A-CATH;  Surgeon: [name], MD;  Location: [place] [facility];  Service: Thoracic;  Laterality: Left;  . SUPRACLAVICAL NODE BIOPSY Right 05/10/2018   Procedure: SUPRACLAVICAL NODE BIOPSY;  Surgeon: [name], MD;  Location: [place];  Service: Thoracic;  Laterality: Right;  . TUBAL LIGATION     Allergies Penicillins and Oxycodone  Family History  Problem Relation Age of Onset  . Diabetes Mother   . Hypertension Mother   . CAD Mother   . CVA Mother     Social History Social History   Tobacco Use  . Smoking status: Never Smoker  . Smokeless tobacco: Never Used  Substance Use Topics  . Alcohol use: No  . Drug use: No    Review of Systems  Constitutional: No fever/chills. Positive weakness.  Eyes: No visual changes. ENT: No sore throat. Cardiovascular: Denies chest pain. Positive near syncope.  Respiratory: Positive shortness of breath. No cough.  Gastrointestinal: No abdominal pain.  No nausea, no vomiting.  No diarrhea.  No constipation. Genitourinary: Negative for dysuria. Musculoskeletal: Negative for back pain. Skin: Negative for rash. Neurological: Negative for headaches, focal weakness or numbness.  10-point ROS otherwise negative.  ____________________________________________  PHYSICAL EXAM:  VITAL SIGNS: Vitals:   06/20/18 1900 06/20/18 1915  BP: 97/72 96/71  Pulse: 79 79  Resp: 20 (!) 21  Temp:    SpO2: 100% 100%    Constitutional: Alert and oriented. Well appearing and in no acute distress. Eyes: Conjunctivae are normal.  Head: Atraumatic. Nose: No congestion/rhinnorhea. Mouth/Throat: Mucous  membranes are moist.  Oropharynx non-erythematous. Neck: No stridor.  Cardiovascular: Normal rate, regular rhythm. Good peripheral circulation. Grossly normal heart sounds. Well-appearing left chest port without tenderness, redness, or warmth.  Respiratory: Normal respiratory effort.  No retractions. Lungs CTAB. Gastrointestinal: Soft and nontender. No distention.  Musculoskeletal: No lower extremity tenderness nor edema. No gross deformities of extremities. Neurologic:  Normal speech and language. No gross focal neurologic deficits are appreciated.  Skin:  Skin is warm, dry and intact. No rash noted.  ____________________________________________   LABS (all labs ordered are listed, but only abnormal results are displayed)  Labs Reviewed  LACTIC ACID, PLASMA - Abnormal; Notable for the following components:      Result Value   Lactic Acid, Venous 2.5 (*)    All other components within normal limits  COMPREHENSIVE METABOLIC PANEL - Abnormal; Notable for the following components:   Glucose, Bld 107 (*)    BUN 24 (*)    Creatinine, Ser 1.29 (*)    Calcium 7.8 (*)    Total Protein 6.1 (*)    Albumin 2.7 (*)    GFR calc non Af Amer 46 (*)    GFR calc Af Amer 53 (*)    All other components within normal limits  CBC WITH DIFFERENTIAL/PLATELET - Abnormal; Notable for the following components:   WBC 23.6 (*)    MCH 25.8 (*)    RDW 19.7 (*)    Neutro Abs 21.8 (*)    Abs Immature Granulocytes 0.71 (*)    All other components within normal limits  TROPONIN I - Abnormal; Notable for the following components:   Troponin I 0.04 (*)    All other components within normal limits  CULTURE, BLOOD (ROUTINE X 2)  CULTURE, BLOOD (ROUTINE X 2)  URINE CULTURE  LIPASE, BLOOD  INFLUENZA PANEL BY PCR (TYPE A & B)  LACTIC ACID, PLASMA  URINALYSIS, ROUTINE W REFLEX MICROSCOPIC   ____________________________________________  EKG   EKG Interpretation  Date/Time:  Thursday June 20 2018 16:54:40  EST Ventricular Rate:  115 PR Interval:    QRS Duration: 77 QT Interval:  316 QTC Calculation: 437 R Axis:   -18 Text Interpretation:  Sinus tachycardia Inferior infarct, old Anteroseptal infarct, old No STEMI.  Confirmed by Nanda Quinton [phone]) on 06/20/2018 5:11:17 PM       ____________________________________________  RADIOLOGY  Dg Chest Port 1 View  Result Date: 06/20/2018 CLINICAL DATA:  Near syncope EXAM: PORTABLE CHEST 1 VIEW COMPARISON:  06/13/2018, PET-CT 05/02/2018 FINDINGS: Left-sided central venous port tip over the mid right atrium. Cardiomegaly with small to moderate left pleural effusion and dense airspace disease at the left base. Decreased mediastinal adenopathy. IMPRESSION: 1. Cardiomegaly with small left pleural effusion and basilar airspace disease. 2. Decreased mediastinal adenopathy. Electronically Signed   By: Donavan Foil M.D.   On: 06/20/2018 17:34    ____________________________________________   PROCEDURES  Procedure(s) performed:   Procedures  CRITICAL CARE Performed by: Margette Fast Total critical care time: 45 minutes Critical care time was exclusive of separately billable procedures and treating other patients. Critical care was necessary to treat or prevent imminent or life-threatening  deterioration. Critical care was time spent personally by me on the following activities: development of treatment plan with patient and/or surrogate as well as nursing, discussions with consultants, evaluation of patient's response to treatment, examination of patient, obtaining history from patient or surrogate, ordering and performing treatments and interventions, ordering and review of laboratory studies, ordering and review of radiographic studies, pulse oximetry and re-evaluation of patient's condition.  Nanda Quinton, MD Emergency Medicine  ____________________________________________   INITIAL IMPRESSION / ASSESSMENT AND PLAN / ED COURSE  Pertinent labs  & imaging results that were available during my care of the patient were reviewed by me and considered in my medical decision making (see chart for details).  Patient with Stage IV lung cancer presents with near syncope, hypotension, shortness of breath.  Symptoms are worse with movement or standing.  No shortness of breath or chest pain at this time.  Patient with some confusion earlier likely secondary to hypotension.  Blood pressures have improved with EMS after giving 400 mils of fluid in route.  Possible dehydration versus side effect from chemotherapy yesterday.  Will obtain sepsis labs, cultures including one off the port.  Plan to give IV fluids.  Patient with no history of congestive heart failure or kidney disease.  Also considering PE is a possibility but will continue to evaluate after initial testing and imaging.   07:15 PM  Patient with significant leukocytosis and elevated lactate.  Suspect infectious etiology although the patient does not have fever.  Her tachycardia and hypotension have improved with IV fluids.  She is awake and alert.  Vital signs have normalized.  My suspicion for PE is lower in this instance.  No CT angio at this time.  Spectrum antibiotics ordered given the patient's active chemotherapy status.  Updated patient and family at bedside who are in agreement with plan.  Patient continues to be largely symptom free.   Discussed patient's case with Hospitalist, Dr. Hal Hope to request admission. Patient and family (if present) updated with plan. Care transferred to Hospitalist service.  I reviewed all nursing notes, vitals, pertinent old records, EKGs, labs, imaging (as available).  ____________________________________________  FINAL CLINICAL IMPRESSION(S) / ED DIAGNOSES  Final diagnoses:  Hypotension, unspecified hypotension type  SIRS (systemic inflammatory response syndrome) (HCC)     MEDICATIONS GIVEN DURING THIS VISIT:  Medications  metroNIDAZOLE  (FLAGYL) IVPB 500 mg (500 mg Intravenous New Bag/Given 06/20/18 1919)  vancomycin (VANCOCIN) 2,500 mg in sodium chloride 0.9 % 500 mL IVPB (has no administration in time range)  ceFEPIme (MAXIPIME) 2 g in sodium chloride 0.9 % 100 mL IVPB (has no administration in time range)  vancomycin (VANCOCIN) 1,500 mg in sodium chloride 0.9 % 500 mL IVPB (has no administration in time range)  sodium chloride 0.9 % bolus 1,000 mL (0 mLs Intravenous Stopped 06/20/18 1835)  0.9 %  sodium chloride infusion ( Intravenous New Bag/Given 06/20/18 1839)  ceFEPIme (MAXIPIME) 2 g in sodium chloride 0.9 % 100 mL IVPB (0 g Intravenous Stopped 06/20/18 1916)    Note:  This document was prepared using Dragon voice recognition software and may include unintentional dictation errors.  Nanda Quinton, MD Emergency Medicine    Adalai Perl, Wonda Old[facility], MD 06/20/18 Joen Laura

## 2018-06-20 NOTE — ED Notes (Signed)
Pt aware we need a urine specimen.

## 2018-06-20 NOTE — ED Notes (Signed)
Notified Dr. Laverta Baltimore of pt's critical value: lactic acid 2.5

## 2018-06-20 NOTE — Progress Notes (Signed)
Pharmacy Antibiotic Note  Samantha Santos is a 58 y.o. female admitted on 06/20/2018 with sepsis.  Pharmacy has been consulted for vancomycin/cefepime dosing. Also with Flagyl ordered per MD. Afebrile, WBC 23.6, LA 2.5. SCr 1.29 on admit.  Plan: Cefepime 2g IV x 1; then 2g IV q12h Vancomycin 2500mg  IV x1; then Vancomycin 1500 mg IV Q 24 hrs. Goal AUC 400-550. Expected AUC: 526 SCr used: 1.29 Monitor clinical progress, c/s, renal function F/u de-escalation plan/LOT, vancomycin trough as indicated   Height: 5\' 7"  (170.2 cm) Weight: 291 lb 0.1 oz (132 kg) IBW/kg (Calculated) : 61.6  Temp (24hrs), Avg:98.5 F (36.9 C), Min:98.5 F (36.9 C), Max:98.5 F (36.9 C)  Recent Labs  Lab 06/17/18 1109 06/20/18 1652  WBC 5.9 23.6*  CREATININE 1.05*  --   LATICACIDVEN  --  2.5*    Estimated Creatinine Clearance: 83.8 mL/min (A) (by C-G formula based on SCr of 1.05 mg/dL (H)).    Allergies  Allergen Reactions  . Penicillins Other (See Comments)    Syncope Did it involve swelling of the face/tongue/throat, SOB, or low BP? No Did it involve sudden or severe rash/hives, skin peeling, or any reaction on the inside of your mouth or nose? No Did you need to seek medical attention at a hospital or doctor's office? Yes When did it last happen?over 20 years ago If all above answers are "NO", may proceed with cephalosporin use.   Marland Kitchen Oxycodone Other (See Comments)    hallucinations    Antimicrobials this admission: 3/5 vancomycin >>  3/5 cefepime >>  3/5 flagyl >>  Dose adjustments this admission:   Microbiology results:   Elicia Lamp, PharmD, BCPS Clinical Pharmacist 06/20/2018 7:02 PM

## 2018-06-20 NOTE — H&P (Signed)
History and Physical    Samantha Santos SWN:[id number] DOB: [birth year] DOA: 06/20/2018  PCP: [name], MD  Patient coming from: Home.  Chief Complaint: Weakness.  HPI: Samantha Santos is a 58 y.o. female with history of stage IV neuroendocrine tumor and hypertension who has undergone chemotherapy secondary to yesterday started of being weakness and lightheadedness when she was driving her grandson to school this morning.  Later she reached home and felt very weak and lay on the bed.  Denies any chest pain shortness of breath nausea vomiting or diarrhea.  Had subjective feeling of fever and chills.  Was feeling weak and late in the evening her friend persuaded to come to the ER and EMS was called.   ED Course: In the ER patient was hypotensive with mildly elevated lactate and significantly elevated leukocytosis.  Patient was empirically started on antibiotics for possible developing sepsis chest x-ray shows possibility of infiltrates and pleural effusion.  UA is pending.  Patient was given fluid bolus for possible sepsis.  Blood pressure improved with fluids.  Review of Systems: As per HPI, rest all negative.   Past Medical History:  Diagnosis Date  . Anemia   . Arthritis   . GERD (gastroesophageal reflux disease)   . Hypertension   . Morbid obesity ([place]) 05/06/2018  . Supraclavicular adenopathy     Past Surgical History:  Procedure Laterality Date  . CESAREAN SECTION  1988  . HYSTEROSCOPY W/D&C  06/14/2011   Procedure: DILATATION AND CURETTAGE /HYSTEROSCOPY;  Surgeon: Frederico Hamman, MD;  Location: [facility];  Service: Gynecology;  Laterality: N/A;  . PORTACATH PLACEMENT Left 06/13/2018   Procedure: INSERTION PORT-A-CATH;  Surgeon: [name], MD;  Location: [place];  Service: Thoracic;  Laterality: Left;  . SUPRACLAVICAL NODE BIOPSY Right 05/10/2018   Procedure: SUPRACLAVICAL NODE BIOPSY;  Surgeon: [name], MD;  Location: [place];  Service: Thoracic;   Laterality: Right;  . TUBAL LIGATION       reports that she has never smoked. She has never used smokeless tobacco. She reports that she does not drink alcohol or use drugs.  Allergies  Allergen Reactions  . Penicillins Other (See Comments)    Syncope Did it involve swelling of the face/tongue/throat, SOB, or low BP? No Did it involve sudden or severe rash/hives, skin peeling, or any reaction on the inside of your mouth or nose? No Did you need to seek medical attention at a hospital or doctor's office? Yes When did it last happen?over 20 years ago If all above answers are "NO", may proceed with cephalosporin use.   Marland Kitchen Oxycodone Other (See Comments)    hallucinations    Family History  Problem Relation Age of Onset  . Diabetes Mother   . Hypertension Mother   . CAD Mother   . CVA Mother     Prior to Admission medications   Medication Sig Start Date End Date Taking? Authorizing Provider  ibuprofen (ADVIL,MOTRIN) 200 MG tablet Take 600 mg by mouth every 6 (six) hours as needed (for pains).   Yes [provider]  lidocaine-prilocaine (EMLA) cream Apply 1 application topically as needed. Patient taking differently: Apply 1 application topically as needed (chemo port).  05/20/18  Yes Curt Bears, MD  loratadine (CLARITIN) 10 MG tablet Take 10 mg by mouth daily.   Yes [provider]  losartan-hydrochlorothiazide (HYZAAR) 100-12.5 MG tablet Take 1 tablet by mouth daily. To lower blood pressure 04/08/18  Yes Fulp, Cammie, MD  Potassium  Chloride ER 20 MEQ TBCR Take 20 mEq by mouth daily. 06/10/18  Yes Curt Bears, MD  prochlorperazine (COMPAZINE) 10 MG tablet Take 1 tablet (10 mg total) by mouth every 6 (six) hours as needed for nausea or vomiting. 05/20/18  Yes Curt Bears, MD  traMADol (ULTRAM) 50 MG tablet Take 1 tablet (50 mg total) by mouth every 6 (six) hours as needed. Patient not taking: Reported on 06/20/2018 06/13/18 06/13/19  [name], MD    Physical Exam: Vitals:   06/20/18 2030 06/20/18 2045 06/20/18 2124 06/20/18 2132  BP: 98/75 103/79 (!) 92/58 108/80  Pulse: 85 79 84 82  Resp: (!) 27 (!) 24 19 (!) 22  Temp:   98.1 F (36.7 C)   TempSrc:   Oral   SpO2: 100% 100% 99% 99%  Weight:   132.6 kg   Height:   5\' 7"  (1.702 m)       Constitutional: Moderately built and nourished. Vitals:   06/20/18 2030 06/20/18 2045 06/20/18 2124 06/20/18 2132  BP: 98/75 103/79 (!) 92/58 108/80  Pulse: 85 79 84 82  Resp: (!) 27 (!) 24 19 (!) 22  Temp:   98.1 F (36.7 C)   TempSrc:   Oral   SpO2: 100% 100% 99% 99%  Weight:   132.6 kg   Height:   5\' 7"  (1.702 m)    Eyes: Anicteric no pallor. ENMT: No discharge from the ears eyes nose and mouth. Neck: No mass felt.  No neck rigidity. Respiratory: No rhonchi or crepitations. Cardiovascular: S1-S2 heard. Abdomen: Soft nontender bowel sounds present. Musculoskeletal: No edema.  No joint effusion. Skin: No rash. Neurologic: Alert awake oriented to time place and person.  Moves all extremities. Psychiatric: Appears normal.  Normal affect.   Labs on Admission: I have personally reviewed following labs and imaging studies  CBC: Recent Labs  Lab 06/17/18 1109 06/20/18 1652  WBC 5.9 23.6*  NEUTROABS 3.7 21.8*  HGB 10.9* 12.2  HCT 34.0* 38.2  MCV 80.2 80.8  PLT 150 643   Basic Metabolic Panel: Recent Labs  Lab 06/17/18 1109 06/20/18 1652  NA 142 138  K 3.9 3.9  CL 104 106  CO2 29 23  GLUCOSE 101* 107*  BUN 16 24*  CREATININE 1.05* 1.29*  CALCIUM 9.0 7.8*   GFR: Estimated Creatinine Clearance: 68.4 mL/min (A) (by C-G formula based on SCr of 1.29 mg/dL (H)). Liver Function Tests: Recent Labs  Lab 06/17/18 1109 06/20/18 1652  AST 8* 16  ALT 14 12  ALKPHOS 125 96  BILITOT 0.6 1.2  PROT 7.9 6.1*  ALBUMIN 3.4* 2.7*   Recent Labs  Lab 06/20/18 1652  LIPASE 26   No results for input(s): AMMONIA in the last 168 hours. Coagulation Profile: No  results for input(s): INR, PROTIME in the last 168 hours. Cardiac Enzymes: Recent Labs  Lab 06/20/18 1652  TROPONINI 0.04*   BNP (last 3 results) No results for input(s): PROBNP in the last 8760 hours. HbA1C: No results for input(s): HGBA1C in the last 72 hours. CBG: No results for input(s): GLUCAP in the last 168 hours. Lipid Profile: No results for input(s): CHOL, HDL, LDLCALC, TRIG, CHOLHDL, LDLDIRECT in the last 72 hours. Thyroid Function Tests: No results for input(s): TSH, T4TOTAL, FREET4, T3FREE, THYROIDAB in the last 72 hours. Anemia Panel: No results for input(s): VITAMINB12, FOLATE, FERRITIN, TIBC, IRON, RETICCTPCT in the last 72 hours. Urine analysis:    Component Value Date/Time   COLORURINE YELLOW 04/24/2013  0323   APPEARANCEUR TURBID (A) 04/24/2013 0323   LABSPEC 1.011 04/24/2013 0323   PHURINE 5.0 04/24/2013 0323   GLUCOSEU NEGATIVE 04/24/2013 0323   HGBUR MODERATE (A) 04/24/2013 0323   BILIRUBINUR NEGATIVE 04/24/2013 0323   KETONESUR NEGATIVE 04/24/2013 0323   PROTEINUR 30 (A) 04/24/2013 0323   UROBILINOGEN 0.2 04/24/2013 0323   NITRITE NEGATIVE 04/24/2013 0323   LEUKOCYTESUR LARGE (A) 04/24/2013 0323   Sepsis Labs: @LABRCNTIP (procalcitonin:4,lacticidven:4) )No results found for this or any previous visit (from the past 240 hour(s)).   Radiological Exams on Admission: Dg Chest Port 1 View  Result Date: 06/20/2018 CLINICAL DATA:  Near syncope EXAM: PORTABLE CHEST 1 VIEW COMPARISON:  06/13/2018, PET-CT 05/02/2018 FINDINGS: Left-sided central venous port tip over the mid right atrium. Cardiomegaly with small to moderate left pleural effusion and dense airspace disease at the left base. Decreased mediastinal adenopathy. IMPRESSION: 1. Cardiomegaly with small left pleural effusion and basilar airspace disease. 2. Decreased mediastinal adenopathy. Electronically Signed   By: Donavan Foil M.D.   On: 06/20/2018 17:34    EKG: Independently reviewed.  Normal sinus  rhythm.  Assessment/Plan Principal Problem:   SIRS (systemic inflammatory response syndrome) (HCC) Active Problems:   Essential hypertension   CKD (chronic kidney disease) stage 2, GFR 60-89 ml/min    1. SIRS -no clear source.  Patient is afebrile presently.  Has significant leukocytosis which could be from Neulasta.  Blood cultures have been obtained started on fluids.  Will check cortisol levels trend lactate levels and troponin check 2D echo continue empiric antibiotics for now.  Follow UA.  2. Hypertension we will hold antihypertensive for now. 3. Chronic kidney disease stage II.  Follow metabolic panel. 4. Has some access difficulties with patient's Port-A-Cath.  IV team has come and seen the patient.  Requesting fluoroscopic guided access.  May discuss with cardiothoracic surgeon Dr. Roxan Hockey in the morning. 5. Stage IV neuroendocrine tumor being followed by Dr. Julien Nordmann.  Patient states she has had some difficulty with the chemo may discuss with him home with in the morning. 6. Anemia follow CBC.   DVT prophylaxis: Lovenox. Code Status: Full code. Family Communication: Patient's family at the bedside. Disposition Plan: Home. Consults called: None. Admission status: Observation.   Rise Patience MD [facility] Pager [phone].  If 7PM-7AM, please contact night-coverage [email or web address] Password [facility]  06/20/2018, 9:56 PM

## 2018-06-20 NOTE — ED Notes (Signed)
Attempted to call report

## 2018-06-20 NOTE — ED Notes (Signed)
ED TO INPATIENT HANDOFF REPORT  ED Nurse Name and Phone #:  Clydene Laming [address] Name/Age/Gender Samantha Santos 58 y.o. female Room/Bed:   Code Status  Full Code  Home/SNF/Other Home Patient oriented : Person / Place / Situation  Is this baseline? Yes  Triage Complete: Triage complete  Chief Complaint Hypotensive/Tachycardia/near syncope  Triage Note Per EMS: pt from home with c/o near syncope, light headedness, and tachycardiac.  Pt is a cancer pt and on chemotherapy.  Pt's last treatment was yesterday.  Pt denies any pain.      Allergies Allergies  Allergen Reactions  . Penicillins Other (See Comments)    Syncope Did it involve swelling of the face/tongue/throat, SOB, or low BP? No Did it involve sudden or severe rash/hives, skin peeling, or any reaction on the inside of your mouth or nose? No Did you need to seek medical attention at a hospital or doctor's office? Yes When did it last happen?over 20 years ago If all above answers are "NO", may proceed with cephalosporin use.   Marland Kitchen Oxycodone Other (See Comments)    hallucinations    Level of Care/Admitting Diagnosis ED Disposition    ED Disposition Condition Comment   Admit  Hospital Area: [place] [[id number]]  Level of Care: Progressive [102]  I expect the patient will be discharged within 24 hours: No (not a candidate for 5C-Observation unit)  Diagnosis: SIRS (systemic inflammatory response syndrome) [facility]) [562130]  Admitting Physician: Rise Patience [phone]  Attending Physician: Rise Patience Lei.Right  PT Class (Do Not Modify): Observation [104]  PT Acc Code (Do Not Modify): Observation [10022]       B Medical/Surgery History Past Medical History:  Diagnosis Date  . Anemia   . Arthritis   . GERD (gastroesophageal reflux disease)   . Hypertension   . Morbid obesity ([place]) 05/06/2018  . Supraclavicular adenopathy    Past Surgical History:  Procedure Laterality Date   . CESAREAN SECTION  1988  . HYSTEROSCOPY W/D&C  06/14/2011   Procedure: DILATATION AND CURETTAGE /HYSTEROSCOPY;  Surgeon: Frederico Hamman, MD;  Location: [facility];  Service: Gynecology;  Laterality: N/A;  . PORTACATH PLACEMENT Left 06/13/2018   Procedure: INSERTION PORT-A-CATH;  Surgeon: [name], MD;  Location: [place];  Service: Thoracic;  Laterality: Left;  . SUPRACLAVICAL NODE BIOPSY Right 05/10/2018   Procedure: SUPRACLAVICAL NODE BIOPSY;  Surgeon: [name], MD;  Location: [place];  Service: Thoracic;  Laterality: Right;  . TUBAL LIGATION       A IV Location/Drains/Wounds Patient Lines/Drains/Airways Status   Active Line/Drains/Airways    Name:   Placement date:   Placement time:   Site:   Days:   Implanted Port 06/13/18 Left Chest   06/13/18    -    Chest   7   Peripheral IV 06/20/18 Left Antecubital   06/20/18    1718    Antecubital   less than 1   External Urinary Catheter   06/20/18    1809    -   less than 1   Incision 06/14/11 Perineum Other (Comment)   06/14/11    1015     2563   Incision (Closed) 05/10/18 Neck Right   05/10/18    1105     41   Incision (Closed) 06/13/18 Chest Other (Comment)   06/13/18    1526     7          Intake/Output  Last 24 hours  Intake/Output Summary (Last 24 hours) at 06/20/2018 2014 Last data filed at 06/20/2018 2505 Gross per 24 hour  Intake 1400 ml  Output -  Net 1400 ml    Labs/Imaging Results for orders placed or performed during the hospital encounter of 06/20/18 (from the past 48 hour(s))  Lactic acid, plasma     Status: Abnormal   Collection Time: 06/20/18  4:52 PM  Result Value Ref Range   Lactic Acid, Venous 2.5 (HH) 0.5 - 1.9 mmol/L    Comment: CRITICAL RESULT CALLED TO, READ BACK BY AND VERIFIED WITH: Candace Cruise 1823 06/20/2018 D BRADLEY Performed at [facility], [place] [address], [place], [place] [address]   Comprehensive metabolic panel     Status: Abnormal   Collection Time: 06/20/18  4:52 PM   Result Value Ref Range   Sodium 138 135 - 145 mmol/L   Potassium 3.9 3.5 - 5.1 mmol/L   Chloride 106 98 - 111 mmol/L   CO2 23 22 - 32 mmol/L   Glucose, Bld 107 (H) 70 - 99 mg/dL   BUN 24 (H) 6 - 20 mg/dL   Creatinine, Ser 1.29 (H) 0.44 - 1.00 mg/dL   Calcium 7.8 (L) 8.9 - 10.3 mg/dL   Total Protein 6.1 (L) 6.5 - 8.1 g/dL   Albumin 2.7 (L) 3.5 - 5.0 g/dL   AST 16 15 - 41 U/L   ALT 12 0 - 44 U/L   Alkaline Phosphatase 96 38 - 126 U/L   Total Bilirubin 1.2 0.3 - 1.2 mg/dL   GFR calc non Af Amer 46 (L) >60 mL/min   GFR calc Af Amer 53 (L) >60 mL/min   Anion gap 9 5 - 15    Comment: Performed at [facility], [place] [address], [place], [place] [address]  CBC WITH DIFFERENTIAL     Status: Abnormal   Collection Time: 06/20/18  4:52 PM  Result Value Ref Range   WBC 23.6 (H) 4.0 - 10.5 K/uL   RBC 4.73 3.87 - 5.11 MIL/uL   Hemoglobin 12.2 12.0 - 15.0 g/dL   HCT 38.2 36.0 - 46.0 %   MCV 80.8 80.0 - 100.0 fL   MCH 25.8 (L) 26.0 - 34.0 pg   MCHC 31.9 30.0 - 36.0 g/dL   RDW 19.7 (H) 11.5 - 15.5 %   Platelets 231 150 - 400 K/uL   nRBC 0.1 0.0 - 0.2 %   Neutrophils Relative % 93 %   Neutro Abs 21.8 (H) 1.7 - 7.7 K/uL   Lymphocytes Relative 4 %   Lymphs Abs 0.9 0.7 - 4.0 K/uL   Monocytes Relative 0 %   Monocytes Absolute 0.1 0.1 - 1.0 K/uL   Eosinophils Relative 0 %   Eosinophils Absolute 0.1 0.0 - 0.5 K/uL   Basophils Relative 0 %   Basophils Absolute 0.0 0.0 - 0.1 K/uL   Immature Granulocytes 3 %   Abs Immature Granulocytes 0.71 (H) 0.00 - 0.07 K/uL    Comment: Performed at [facility], [address], [place], [place] [address]  Lipase, blood     Status: None   Collection Time: 06/20/18  4:52 PM  Result Value Ref Range   Lipase 26 11 - 51 U/L    Comment: Performed at [facility] [facility] [address] [place], [place] [address]  Troponin I - Once     Status: Abnormal   Collection Time: 06/20/18  4:52 PM  Result  Value Ref Range   Troponin I 0.04 (HH) <0.03 ng/mL     Comment: CRITICAL RESULT CALLED TO, READ BACK BY AND VERIFIED WITHLaurence Spates 1906 06/20/2018 D BRADLEY Performed at [facility], [place] [address] [place], [place] [address]   Influenza panel by PCR (type A & B)     Status: None   Collection Time: 06/20/18  5:40 PM  Result Value Ref Range   Influenza A By PCR NEGATIVE NEGATIVE   Influenza B By PCR NEGATIVE NEGATIVE    Comment: (NOTE) The Xpert Xpress Flu assay is intended as an aid in the diagnosis of  influenza and should not be used as a sole basis for treatment.  This  assay is FDA approved for nasopharyngeal swab specimens only. Nasal  washings and aspirates are unacceptable for Xpert Xpress Flu testing. Performed at [facility], [place] [address] [place], [place] [address]    Dg Chest Port 1 View  Result Date: 06/20/2018 CLINICAL DATA:  Near syncope EXAM: PORTABLE CHEST 1 VIEW COMPARISON:  06/13/2018, PET-CT 05/02/2018 FINDINGS: Left-sided central venous port tip over the mid right atrium. Cardiomegaly with small to moderate left pleural effusion and dense airspace disease at the left base. Decreased mediastinal adenopathy. IMPRESSION: 1. Cardiomegaly with small left pleural effusion and basilar airspace disease. 2. Decreased mediastinal adenopathy. Electronically Signed   By: Donavan Foil M.D.   On: 06/20/2018 17:34    Pending Labs Unresulted Labs (From admission, onward)    Start     Ordered   06/20/18 1653  Urine culture  ONCE - STAT,   STAT    Question:  Patient immune status  Answer:  Normal   06/20/18 1652   06/20/18 1652  Lactic acid, plasma  Now then every 2 hours,   STAT     06/20/18 1652   06/20/18 1652  Blood Culture (routine x 2)  BLOOD CULTURE X 2,   STAT    Question:  Patient immune status  Answer:  Normal   06/20/18 1652   06/20/18 1652  Urinalysis, Routine w reflex microscopic  ONCE - STAT,   STAT     06/20/18 1652          Vitals/Pain Today's Vitals   06/20/18 1930 06/20/18 1945 06/20/18  2000 06/20/18 2013  BP: 96/74 91/75 95/69    Pulse: 76 78 89   Resp: (!) 22 (!) 22 (!) 25   Temp:      TempSrc:      SpO2: 98% 99% 98%   Weight:      Height:      PainSc:    Asleep    Isolation Precautions No active isolations  Medications Medications  metroNIDAZOLE (FLAGYL) IVPB 500 mg (500 mg Intravenous New Bag/Given 06/20/18 1919)  vancomycin (VANCOCIN) 2,500 mg in sodium chloride 0.9 % 500 mL IVPB (has no administration in time range)  ceFEPIme (MAXIPIME) 2 g in sodium chloride 0.9 % 100 mL IVPB (has no administration in time range)  vancomycin (VANCOCIN) 1,500 mg in sodium chloride 0.9 % 500 mL IVPB (has no administration in time range)  sodium chloride 0.9 % bolus 1,000 mL (0 mLs Intravenous Stopped 06/20/18 1835)  0.9 %  sodium chloride infusion ( Intravenous New Bag/Given 06/20/18 1839)  ceFEPIme (MAXIPIME) 2 g in sodium chloride 0.9 % 100 mL IVPB (0 g Intravenous Stopped 06/20/18 1916)    Mobility walks Moderate fall risk   Focused Assessments Respirations unlabored , IV site intact ,  denies pain at this time    R Recommendations: See Admitting Provider Note  Report given to:   Additional Notes:

## 2018-06-20 NOTE — ED Triage Notes (Signed)
Per EMS: pt from home with c/o near syncope, light headedness, and tachycardiac.  Pt is a cancer pt and on chemotherapy.  Pt's last treatment was yesterday.  Pt denies any pain.

## 2018-06-20 NOTE — Progress Notes (Signed)
At bedside to check [facility] access done by bedside RN.  Attempted to pull blood off of port but only drew back 10cc blood tinged fluid.  Some swelling noted around port site which patient states that it has been like this.  Deaccessed port and attempted to reaccess, port edges are difficult to feel.  Continuing to pull back blood tinged fluid then scant amount of blood.  Attempted to flush with 2 cc NS with ease, patient reports no pain.  Attempts to aspirate again but only pulling back blood tinged fluid.  Port deaccessed at this time as no blood return is able to be obtained consistently.  Request made that port be assessed and accessed under fluro to determine patency of port.  Patient agreeable, Jenny Reichmann, RN at bedside.  Carolee Rota, RN VAST

## 2018-06-20 NOTE — Progress Notes (Signed)
Patient arrived to [address] room 8, CHG bath completed, telemetry applied and CCMD notified x2, RN skin assessment x2 completed. Patient oriented to the room, VSS, will continue to monitor.

## 2018-06-20 NOTE — Plan of Care (Signed)

## 2018-06-21 ENCOUNTER — Observation Stay (HOSPITAL_BASED_OUTPATIENT_CLINIC_OR_DEPARTMENT_OTHER): Payer: Medicaid Other

## 2018-06-21 ENCOUNTER — Ambulatory Visit: Payer: PRIVATE HEALTH INSURANCE

## 2018-06-21 DIAGNOSIS — R6510 Systemic inflammatory response syndrome (SIRS) of non-infectious origin without acute organ dysfunction: Secondary | ICD-10-CM | POA: Diagnosis present

## 2018-06-21 DIAGNOSIS — Z833 Family history of diabetes mellitus: Secondary | ICD-10-CM | POA: Diagnosis not present

## 2018-06-21 DIAGNOSIS — N183 Chronic kidney disease, stage 3 (moderate): Secondary | ICD-10-CM | POA: Diagnosis present

## 2018-06-21 DIAGNOSIS — Z8249 Family history of ischemic heart disease and other diseases of the circulatory system: Secondary | ICD-10-CM | POA: Diagnosis not present

## 2018-06-21 DIAGNOSIS — D72819 Decreased white blood cell count, unspecified: Secondary | ICD-10-CM | POA: Diagnosis present

## 2018-06-21 DIAGNOSIS — T451X5A Adverse effect of antineoplastic and immunosuppressive drugs, initial encounter: Secondary | ICD-10-CM | POA: Diagnosis present

## 2018-06-21 DIAGNOSIS — Z823 Family history of stroke: Secondary | ICD-10-CM | POA: Diagnosis not present

## 2018-06-21 DIAGNOSIS — R0600 Dyspnea, unspecified: Secondary | ICD-10-CM

## 2018-06-21 DIAGNOSIS — J189 Pneumonia, unspecified organism: Secondary | ICD-10-CM | POA: Diagnosis present

## 2018-06-21 DIAGNOSIS — N179 Acute kidney failure, unspecified: Secondary | ICD-10-CM | POA: Diagnosis present

## 2018-06-21 DIAGNOSIS — A419 Sepsis, unspecified organism: Secondary | ICD-10-CM | POA: Diagnosis present

## 2018-06-21 DIAGNOSIS — K219 Gastro-esophageal reflux disease without esophagitis: Secondary | ICD-10-CM | POA: Diagnosis present

## 2018-06-21 DIAGNOSIS — I129 Hypertensive chronic kidney disease with stage 1 through stage 4 chronic kidney disease, or unspecified chronic kidney disease: Secondary | ICD-10-CM | POA: Diagnosis present

## 2018-06-21 DIAGNOSIS — D6481 Anemia due to antineoplastic chemotherapy: Secondary | ICD-10-CM | POA: Diagnosis present

## 2018-06-21 DIAGNOSIS — C3412 Malignant neoplasm of upper lobe, left bronchus or lung: Secondary | ICD-10-CM | POA: Diagnosis present

## 2018-06-21 DIAGNOSIS — R6520 Severe sepsis without septic shock: Secondary | ICD-10-CM | POA: Diagnosis present

## 2018-06-21 DIAGNOSIS — D6959 Other secondary thrombocytopenia: Secondary | ICD-10-CM | POA: Diagnosis present

## 2018-06-21 DIAGNOSIS — C3490 Malignant neoplasm of unspecified part of unspecified bronchus or lung: Secondary | ICD-10-CM

## 2018-06-21 DIAGNOSIS — I10 Essential (primary) hypertension: Secondary | ICD-10-CM

## 2018-06-21 LAB — URINALYSIS, ROUTINE W REFLEX MICROSCOPIC
Bilirubin Urine: NEGATIVE
Glucose, UA: NEGATIVE mg/dL
Hgb urine dipstick: NEGATIVE
Ketones, ur: NEGATIVE mg/dL
Leukocytes,Ua: NEGATIVE
NITRITE: NEGATIVE
Protein, ur: NEGATIVE mg/dL
SPECIFIC GRAVITY, URINE: 1.011 (ref 1.005–1.030)
pH: 5 (ref 5.0–8.0)

## 2018-06-21 LAB — TROPONIN I
Troponin I: 0.05 ng/mL (ref <0.03)
Troponin I: 0.04 ng/mL (ref <0.03)

## 2018-06-21 LAB — CBC WITH DIFFERENTIAL/PLATELET
Abs Immature Granulocytes: 0 10*3/uL (ref 0.00–0.07)
Basophils Absolute: 0 10*3/uL (ref 0.0–0.1)
Basophils Relative: 0 %
Eosinophils Absolute: 0 10*3/uL (ref 0.0–0.5)
Eosinophils Relative: 0 %
HCT: 31.3 % — ABNORMAL LOW (ref 36.0–46.0)
HEMOGLOBIN: 10.1 g/dL — AB (ref 12.0–15.0)
Lymphocytes Relative: 5 %
Lymphs Abs: 2.2 10*3/uL (ref 0.7–4.0)
MCH: 26 pg (ref 26.0–34.0)
MCHC: 32.3 g/dL (ref 30.0–36.0)
MCV: 80.5 fL (ref 80.0–100.0)
Monocytes Absolute: 0 10*3/uL — ABNORMAL LOW (ref 0.1–1.0)
Monocytes Relative: 0 %
NEUTROS ABS: 40.9 10*3/uL — AB (ref 1.7–7.7)
Neutrophils Relative %: 95 %
Platelets: 184 10*3/uL (ref 150–400)
RBC: 3.89 MIL/uL (ref 3.87–5.11)
RDW: 19.4 % — AB (ref 11.5–15.5)
WBC: 43.1 10*3/uL — ABNORMAL HIGH (ref 4.0–10.5)
nRBC: 0 % (ref 0.0–0.2)
nRBC: 0 /100 WBC

## 2018-06-21 LAB — COMPREHENSIVE METABOLIC PANEL
ALT: 14 U/L (ref 0–44)
ANION GAP: 5 (ref 5–15)
AST: 10 U/L — ABNORMAL LOW (ref 15–41)
Albumin: 2.6 g/dL — ABNORMAL LOW (ref 3.5–5.0)
Alkaline Phosphatase: 74 U/L (ref 38–126)
BUN: 24 mg/dL — ABNORMAL HIGH (ref 6–20)
CO2: 22 mmol/L (ref 22–32)
Calcium: 7.3 mg/dL — ABNORMAL LOW (ref 8.9–10.3)
Chloride: 110 mmol/L (ref 98–111)
Creatinine, Ser: 1.17 mg/dL — ABNORMAL HIGH (ref 0.44–1.00)
GFR calc Af Amer: 60 mL/min — ABNORMAL LOW (ref >60)
GFR calc non Af Amer: 52 mL/min — ABNORMAL LOW (ref >60)
Glucose, Bld: 94 mg/dL (ref 70–99)
POTASSIUM: 3.8 mmol/L (ref 3.5–5.1)
Sodium: 137 mmol/L (ref 135–145)
Total Bilirubin: 1.2 mg/dL (ref 0.3–1.2)
Total Protein: 5.5 g/dL — ABNORMAL LOW (ref 6.5–8.1)

## 2018-06-21 LAB — ECHOCARDIOGRAM COMPLETE
Height: 67 in
Weight: 4712.55 oz

## 2018-06-21 LAB — HIV ANTIBODY (ROUTINE TESTING W REFLEX): HIV Screen 4th Generation wRfx: NONREACTIVE

## 2018-06-21 LAB — LACTIC ACID, PLASMA: Lactic Acid, Venous: 1.2 mmol/L (ref 0.5–1.9)

## 2018-06-21 LAB — CORTISOL: CORTISOL PLASMA: 4 ug/dL

## 2018-06-21 MED ORDER — SODIUM CHLORIDE 0.9 % IV BOLUS
1000.0000 mL | Freq: Once | INTRAVENOUS | Status: AC
  Administered 2018-06-21: 1000 mL via INTRAVENOUS

## 2018-06-21 MED ORDER — SODIUM CHLORIDE 0.9 % IV SOLN
2.0000 g | Freq: Three times a day (TID) | INTRAVENOUS | Status: DC
  Administered 2018-06-21 – 2018-06-24 (×9): 2 g via INTRAVENOUS
  Filled 2018-06-21 (×10): qty 2

## 2018-06-21 NOTE — Progress Notes (Signed)
PROGRESS NOTE    Samantha Santos  HYW:[id number] DOB: 04-10-1961 DOA: 06/20/2018 PCP: [name], MD (Confirm with patient/family/NH records and if not entered, this HAS to be entered at [facility] point of entry. "No PCP" if truly none.)   Brief Narrative:  Samantha Santos is a 58 y.o. female with history of stage IV neuroendocrine tumor and hypertension who has undergone chemotherapy secondary to yesterday started of being weakness and lightheadedness when she was driving her grandson to school this morning.  Later she reached home and felt very weak and lay on the bed.  Denies any chest pain shortness of breath nausea vomiting or diarrhea.  Had subjective feeling of fever and chills.  Was feeling weak and late in the evening her friend persuaded to come to the ER and EMS was called.   ED Course: In the ER patient was hypotensive with mildly elevated lactate and significantly elevated leukocytosis.  Patient was empirically started on antibiotics for possible developing sepsis chest x-ray shows possibility of infiltrates and pleural effusion.  UA is pending.  Patient was given fluid bolus for possible sepsis.  Blood pressure improved with fluids.   Assessment & Plan:   Principal Problem:   SIRS (systemic inflammatory response syndrome) (HCC) Active Problems:   Essential hypertension   CKD (chronic kidney disease) stage 2, GFR 60-89 ml/min   ##Severe sepsis of unknown source -Patient admitted with hypotension, tachycardia, elevated white blood cell count, elevated lactic acid -Follow-up with blood cultures, urine cultures -Continue with vancomycin, cefepime -De-escalate the antibiotics once culture data is available  ##Acute kidney injury mild -Improving with IV fluids, normalizing the blood pressure  ##Hypotension -Secondary to sepsis -Continue with IV fluids and follow-up  ##Gastroesophageal reflux disease -Continue the Protonix  ##Hypertension -Holding all blood pressure  medications  ##Neuroendocrine tumor stage IV -Last chemotherapy 2 days back    DVT prophylaxis: Lovenox Code Status: Full code  family Communication: Patient and 2 daughters Disposition Plan:  Home   Procedures: Blood cultures  Antimicrobials:  Vancomycin 06/20/2018- Cefepime 06/20/2018-  Subjective: Patient states improvement from yesterday with generalized weakness.  Tolerating the diet well.  Denied having any cough, shortness of breath, dysuria, abdominal pain, diarrhea  Objective: Vitals:   06/21/18 0555 06/21/18 0600 06/21/18 0801 06/21/18 1815  BP: 100/67 92/66 (!) 94/58 91/79  Pulse: 82 84 85 84  Resp: (!) 21 (!) 23 16 20   Temp:   98.7 F (37.1 C) 98 F (36.7 C)  TempSrc:   Oral Oral  SpO2: 100% 100% 100% 100%  Weight:      Height:        Intake/Output Summary (Last 24 hours) at 06/21/2018 1926 Last data filed at 06/21/2018 1700 Gross per 24 hour  Intake 2113.5 ml  Output 900 ml  Net 1213.5 ml   Filed Weights   06/20/18 1720 06/20/18 2124 06/21/18 0425  Weight: 132 kg 132.6 kg 133.6 kg    Examination:  General exam: Appears calm and comfortable  Respiratory system: Clear to auscultation. Respiratory effort normal. Cardiovascular system: S1 & S2 heard, RRR. No JVD, murmurs, rubs, gallops or clicks. No pedal edema. Gastrointestinal system: Abdomen is nondistended, soft and nontender. No organomegaly or masses felt. Normal bowel sounds heard. Central nervous system: Alert and oriented. No focal neurological deficits. Extremities: Symmetric 5 x 5 power. Skin: No rashes, lesions or ulcers Psychiatry: Judgement and insight appear normal. Mood & affect appropriate.     Data Reviewed: I have personally reviewed following labs and imaging  studies  CBC: Recent Labs  Lab 06/17/18 1109 06/20/18 1652 06/21/18 0216  WBC 5.9 23.6* 43.1*  NEUTROABS 3.7 21.8* 40.9*  HGB 10.9* 12.2 10.1*  HCT 34.0* 38.2 31.3*  MCV 80.2 80.8 80.5  PLT 150 231 086   Basic  Metabolic Panel: Recent Labs  Lab 06/17/18 1109 06/20/18 1652 06/21/18 0216  NA 142 138 137  K 3.9 3.9 3.8  CL 104 106 110  CO2 29 23 22   GLUCOSE 101* 107* 94  BUN 16 24* 24*  CREATININE 1.05* 1.29* 1.17*  CALCIUM 9.0 7.8* 7.3*   GFR: Estimated Creatinine Clearance: 75.7 mL/min (A) (by C-G formula based on SCr of 1.17 mg/dL (H)). Liver Function Tests: Recent Labs  Lab 06/17/18 1109 06/20/18 1652 06/21/18 0216  AST 8* 16 10*  ALT 14 12 14   ALKPHOS 125 96 74  BILITOT 0.6 1.2 1.2  PROT 7.9 6.1* 5.5*  ALBUMIN 3.4* 2.7* 2.6*   Recent Labs  Lab 06/20/18 1652  LIPASE 26   No results for input(s): AMMONIA in the last 168 hours. Coagulation Profile: No results for input(s): INR, PROTIME in the last 168 hours. Cardiac Enzymes: Recent Labs  Lab 06/20/18 1652 06/20/18 2218 06/21/18 0216 06/21/18 1050  TROPONINI 0.04* 0.06* 0.05* 0.04*   BNP (last 3 results) No results for input(s): PROBNP in the last 8760 hours. HbA1C: No results for input(s): HGBA1C in the last 72 hours. CBG: No results for input(s): GLUCAP in the last 168 hours. Lipid Profile: No results for input(s): CHOL, HDL, LDLCALC, TRIG, CHOLHDL, LDLDIRECT in the last 72 hours. Thyroid Function Tests: No results for input(s): TSH, T4TOTAL, FREET4, T3FREE, THYROIDAB in the last 72 hours. Anemia Panel: No results for input(s): VITAMINB12, FOLATE, FERRITIN, TIBC, IRON, RETICCTPCT in the last 72 hours. Sepsis Labs: Recent Labs  Lab 06/20/18 1652 06/20/18 1957 06/20/18 2218 06/21/18 0216  LATICACIDVEN 2.5* 1.7 1.8 1.2    Recent Results (from the past 240 hour(s))  Blood Culture (routine x 2)     Status: None (Preliminary result)   Collection Time: 06/20/18  6:07 PM  Result Value Ref Range Status   Specimen Description BLOOD LEFT ANTECUBITAL  Final   Special Requests   Final    BOTTLES DRAWN AEROBIC AND ANAEROBIC Blood Culture adequate volume   Culture   Final    NO GROWTH < 24 HOURS Performed at  [facility], [place] [address], [place], [place] [address]    Report Status PENDING  Incomplete  Blood Culture (routine x 2)     Status: None (Preliminary result)   Collection Time: 06/20/18  6:25 PM  Result Value Ref Range Status   Specimen Description BLOOD RIGHT ANTECUBITAL  Final   Special Requests   Final    BOTTLES DRAWN AEROBIC AND ANAEROBIC Blood Culture adequate volume   Culture   Final    NO GROWTH < 24 HOURS Performed at [facility], [place] [address] [place], [place] [address]    Report Status PENDING  Incomplete         Radiology Studies: Dg Chest Port 1 View  Result Date: 06/20/2018 CLINICAL DATA:  Near syncope EXAM: PORTABLE CHEST 1 VIEW COMPARISON:  06/13/2018, PET-CT 05/02/2018 FINDINGS: Left-sided central venous port tip over the mid right atrium. Cardiomegaly with small to moderate left pleural effusion and dense airspace disease at the left base. Decreased mediastinal adenopathy. IMPRESSION: 1. Cardiomegaly with small left pleural effusion and basilar airspace disease. 2. Decreased mediastinal adenopathy. Electronically  Signed   By: Donavan Foil M.D.   On: 06/20/2018 17:34        Scheduled Meds: . enoxaparin (LOVENOX) injection  40 mg Subcutaneous Q24H   Continuous Infusions: . sodium chloride 100 mL/hr at 06/21/18 1907  . ceFEPime (MAXIPIME) IV    . metronidazole 500 mg (06/21/18 1909)  . sodium chloride    . vancomycin       LOS: 0 days    Time spent: 30 minutes   Jasminemarie Sherrard, MD [facility] Pager [phone]  If 7PM-7AM, please contact night-coverage [email or web address] Password [facility] 06/21/2018, 7:26 PM

## 2018-06-21 NOTE — Progress Notes (Addendum)
Patient's BP 84/52 checked manually, patient stated she feels a little funny, text paged to notify MD on call. Awaiting new orders, will continue to monitor.   1 L NS bolus started.

## 2018-06-21 NOTE — Progress Notes (Signed)
  Echocardiogram 2D Echocardiogram has been performed.  Samantha Santos 06/21/2018, 9:04 AM

## 2018-06-22 DIAGNOSIS — N182 Chronic kidney disease, stage 2 (mild): Secondary | ICD-10-CM

## 2018-06-22 LAB — CBC WITH DIFFERENTIAL/PLATELET
Abs Immature Granulocytes: 2.23 10*3/uL — ABNORMAL HIGH (ref 0.00–0.07)
Basophils Absolute: 0.1 10*3/uL (ref 0.0–0.1)
Basophils Relative: 0 %
Eosinophils Absolute: 0 10*3/uL (ref 0.0–0.5)
Eosinophils Relative: 0 %
HCT: 28.6 % — ABNORMAL LOW (ref 36.0–46.0)
Hemoglobin: 9.1 g/dL — ABNORMAL LOW (ref 12.0–15.0)
Immature Granulocytes: 10 %
Lymphocytes Relative: 7 %
Lymphs Abs: 1.6 10*3/uL (ref 0.7–4.0)
MCH: 25.2 pg — ABNORMAL LOW (ref 26.0–34.0)
MCHC: 31.8 g/dL (ref 30.0–36.0)
MCV: 79.2 fL — ABNORMAL LOW (ref 80.0–100.0)
Monocytes Absolute: 0.1 10*3/uL (ref 0.1–1.0)
Monocytes Relative: 0 %
NEUTROS PCT: 83 %
Neutro Abs: 19.3 10*3/uL — ABNORMAL HIGH (ref 1.7–7.7)
PLATELETS: 124 10*3/uL — AB (ref 150–400)
RBC: 3.61 MIL/uL — ABNORMAL LOW (ref 3.87–5.11)
RDW: 19.5 % — ABNORMAL HIGH (ref 11.5–15.5)
WBC: 23.3 10*3/uL — ABNORMAL HIGH (ref 4.0–10.5)
nRBC: 0 % (ref 0.0–0.2)

## 2018-06-22 LAB — URINE CULTURE
Culture: NO GROWTH
Special Requests: NORMAL

## 2018-06-22 LAB — BASIC METABOLIC PANEL
Anion gap: 6 (ref 5–15)
BUN: 15 mg/dL (ref 6–20)
CO2: 22 mmol/L (ref 22–32)
Calcium: 7.5 mg/dL — ABNORMAL LOW (ref 8.9–10.3)
Chloride: 108 mmol/L (ref 98–111)
Creatinine, Ser: 0.84 mg/dL (ref 0.44–1.00)
GFR calc Af Amer: >60 mL/min (ref >60)
GFR calc non Af Amer: >60 mL/min (ref >60)
Glucose, Bld: 103 mg/dL — ABNORMAL HIGH (ref 70–99)
POTASSIUM: 4 mmol/L (ref 3.5–5.1)
Sodium: 136 mmol/L (ref 135–145)

## 2018-06-22 NOTE — Progress Notes (Deleted)
Spoke with Dr Lucia Gaskins re PICC placement with blood cultures pending from 06-21-18.  States need for PICC is greater at this time and to please proceed with PICC line.

## 2018-06-22 NOTE — Progress Notes (Signed)
PROGRESS NOTE    Samantha Santos  WJX:[id number] DOB: 1961-03-23 DOA: 06/20/2018 PCP: [name], MD (Confirm with patient/family/NH records and if not entered, this HAS to be entered at [facility] point of entry. "No PCP" if truly none.)   Brief Narrative:  Samantha Santos is a 58 y.o. female with history of stage IV neuroendocrine tumor and hypertension who has undergone chemotherapy secondary to yesterday started of being weakness and lightheadedness when she was driving her grandson to school this morning.  Later she reached home and felt very weak and lay on the bed.  Denies any chest pain shortness of breath nausea vomiting or diarrhea.  Had subjective feeling of fever and chills.  Was feeling weak and late in the evening her friend persuaded to come to the ER and EMS was called.   ED Course: In the ER patient was hypotensive with mildly elevated lactate and significantly elevated leukocytosis.  Patient was empirically started on antibiotics for possible developing sepsis chest x-ray shows possibility of infiltrates and pleural effusion.  UA is pending.  Patient was given fluid bolus for possible sepsis.  Blood pressure improved with fluids.   Assessment & Plan:   Principal Problem:   SIRS (systemic inflammatory response syndrome) (HCC) Active Problems:   Essential hypertension   CKD (chronic kidney disease) stage 2, GFR 60-89 ml/min   ##Severe sepsis of unknown source -Patient admitted with hypotension, tachycardia, elevated white blood cell count, elevated lactic acid -Follow-up with blood cultures, urine cultures negative to date -Continue with vancomycin, cefepime -De-escalate the antibiotics once culture data is available  ##Acute kidney injury mild -Improving with IV fluids, normalizing the blood pressure -Normalized  ##Hypotension -Secondary to sepsis -Continue with IV fluids and follow-up -Normalized  ##Thrombocytopenia -Platelets are trended down from 231->124 -Most  likely chemo induced, closely follow-up  ##Leukocytosis -Patient received granulocyte stimulating factor injection after the chemo, possibility of infection -Trending down  ##Anemia chemo induced -Hemoglobin trended down from 12-9.1 today -Closely follow-up with CBC -No signs of any bleeding  ##Gastroesophageal reflux disease -Continue the Protonix  ##Hypertension -Holding all blood pressure medications  ##Neuroendocrine tumor stage IV -Last chemotherapy 2 days back    DVT prophylaxis: Lovenox Code Status: Full code  family Communication: Patient and 2 daughters Disposition Plan:  Home   Procedures: Blood cultures  Antimicrobials:  Vancomycin 06/20/2018- Cefepime 06/20/2018-  Subjective: Patient states improvement from the time of the admission.  Is been afebrile in the last 24 hours Objective: Vitals:   06/21/18 2340 06/22/18 0446 06/22/18 0715 06/22/18 1211  BP:    104/69  Pulse:    83  Resp: 20   20  Temp: 98.8 F (37.1 C) 99.1 F (37.3 C)  97.9 F (36.6 C)  TempSrc: Oral Oral  Oral  SpO2:    98%  Weight:   132.9 kg   Height:        Intake/Output Summary (Last 24 hours) at 06/22/2018 1412 Last data filed at 06/22/2018 0840 Gross per 24 hour  Intake 1080 ml  Output 400 ml  Net 680 ml   Filed Weights   06/20/18 2124 06/21/18 0425 06/22/18 0715  Weight: 132.6 kg 133.6 kg 132.9 kg    Examination:  General exam: Appears calm and comfortable  Respiratory system: Clear to auscultation. Respiratory effort normal. Cardiovascular system: S1 & S2 heard, RRR. No JVD, murmurs, rubs, gallops or clicks. No pedal edema. Gastrointestinal system: Abdomen is nondistended, soft and nontender. No organomegaly or masses felt. Normal bowel sounds  heard. Central nervous system: Alert and oriented. No focal neurological deficits. Extremities: Symmetric 5 x 5 power. Skin: No rashes, lesions or ulcers Psychiatry: Judgement and insight appear normal. Mood & affect  appropriate.     Data Reviewed: I have personally reviewed following labs and imaging studies  CBC: Recent Labs  Lab 06/17/18 1109 06/20/18 1652 06/21/18 0216 06/22/18 0346  WBC 5.9 23.6* 43.1* 23.3*  NEUTROABS 3.7 21.8* 40.9* 19.3*  HGB 10.9* 12.2 10.1* 9.1*  HCT 34.0* 38.2 31.3* 28.6*  MCV 80.2 80.8 80.5 79.2*  PLT 150 231 184 150*   Basic Metabolic Panel: Recent Labs  Lab 06/17/18 1109 06/20/18 1652 06/21/18 0216 06/22/18 0346  NA 142 138 137 136  K 3.9 3.9 3.8 4.0  CL 104 106 110 108  CO2 29 23 22 22   GLUCOSE 101* 107* 94 103*  BUN 16 24* 24* 15  CREATININE 1.05* 1.29* 1.17* 0.84  CALCIUM 9.0 7.8* 7.3* 7.5*   GFR: Estimated Creatinine Clearance: 105.1 mL/min (by C-G formula based on SCr of 0.84 mg/dL). Liver Function Tests: Recent Labs  Lab 06/17/18 1109 06/20/18 1652 06/21/18 0216  AST 8* 16 10*  ALT 14 12 14   ALKPHOS 125 96 74  BILITOT 0.6 1.2 1.2  PROT 7.9 6.1* 5.5*  ALBUMIN 3.4* 2.7* 2.6*   Recent Labs  Lab 06/20/18 1652  LIPASE 26   No results for input(s): AMMONIA in the last 168 hours. Coagulation Profile: No results for input(s): INR, PROTIME in the last 168 hours. Cardiac Enzymes: Recent Labs  Lab 06/20/18 1652 06/20/18 2218 06/21/18 0216 06/21/18 1050  TROPONINI 0.04* 0.06* 0.05* 0.04*   BNP (last 3 results) No results for input(s): PROBNP in the last 8760 hours. HbA1C: No results for input(s): HGBA1C in the last 72 hours. CBG: No results for input(s): GLUCAP in the last 168 hours. Lipid Profile: No results for input(s): CHOL, HDL, LDLCALC, TRIG, CHOLHDL, LDLDIRECT in the last 72 hours. Thyroid Function Tests: No results for input(s): TSH, T4TOTAL, FREET4, T3FREE, THYROIDAB in the last 72 hours. Anemia Panel: No results for input(s): VITAMINB12, FOLATE, FERRITIN, TIBC, IRON, RETICCTPCT in the last 72 hours. Sepsis Labs: Recent Labs  Lab 06/20/18 1652 06/20/18 1957 06/20/18 2218 06/21/18 0216  LATICACIDVEN 2.5* 1.7  1.8 1.2    Recent Results (from the past 240 hour(s))  Urine culture     Status: None   Collection Time: 06/20/18  4:53 PM  Result Value Ref Range Status   Specimen Description URINE, CLEAN CATCH  Final   Special Requests Normal  Final   Culture   Final    NO GROWTH Performed at [facility], [place] [address] [place], [place] [address]    Report Status 06/22/2018 FINAL  Final  Blood Culture (routine x 2)     Status: None (Preliminary result)   Collection Time: 06/20/18  6:07 PM  Result Value Ref Range Status   Specimen Description BLOOD LEFT ANTECUBITAL  Final   Special Requests   Final    BOTTLES DRAWN AEROBIC AND ANAEROBIC Blood Culture adequate volume   Culture   Final    NO GROWTH 2 DAYS Performed at [facility], [place] [address], [place],  [address]    Report Status PENDING  Incomplete  Blood Culture (routine x 2)     Status: None (Preliminary result)   Collection Time: 06/20/18  6:25 PM  Result Value Ref Range Status   Specimen Description BLOOD RIGHT ANTECUBITAL  Final  Special Requests   Final    BOTTLES DRAWN AEROBIC AND ANAEROBIC Blood Culture adequate volume   Culture   Final    NO GROWTH 2 DAYS Performed at [facility], [place] [address], [place], [place] [address]    Report Status PENDING  Incomplete         Radiology Studies: Dg Chest Port 1 View  Result Date: 06/20/2018 CLINICAL DATA:  Near syncope EXAM: PORTABLE CHEST 1 VIEW COMPARISON:  06/13/2018, PET-CT 05/02/2018 FINDINGS: Left-sided central venous port tip over the mid right atrium. Cardiomegaly with small to moderate left pleural effusion and dense airspace disease at the left base. Decreased mediastinal adenopathy. IMPRESSION: 1. Cardiomegaly with small left pleural effusion and basilar airspace disease. 2. Decreased mediastinal adenopathy. Electronically Signed   By: Donavan Foil M.D.   On: 06/20/2018 17:34        Scheduled Meds: . enoxaparin (LOVENOX) injection  40 mg  Subcutaneous Q24H   Continuous Infusions: . ceFEPime (MAXIPIME) IV 2 g (06/22/18 1329)  . metronidazole 500 mg (06/22/18 1100)  . vancomycin 1,500 mg (06/21/18 2049)     LOS: 1 day    Time spent: 30 minutes   Kerron Sedano, MD [facility] Pager [phone]  If 7PM-7AM, please contact night-coverage [email or web address] Password [email or web address] 06/22/2018, 2:12 PM

## 2018-06-23 LAB — BASIC METABOLIC PANEL
Anion gap: 7 (ref 5–15)
BUN: 13 mg/dL (ref 6–20)
CALCIUM: 8 mg/dL — AB (ref 8.9–10.3)
CO2: 24 mmol/L (ref 22–32)
Chloride: 105 mmol/L (ref 98–111)
Creatinine, Ser: 0.74 mg/dL (ref 0.44–1.00)
GFR calc non Af Amer: >60 mL/min (ref >60)
Glucose, Bld: 110 mg/dL — ABNORMAL HIGH (ref 70–99)
Potassium: 3.9 mmol/L (ref 3.5–5.1)
Sodium: 136 mmol/L (ref 135–145)

## 2018-06-23 LAB — CBC
HCT: 25.7 % — ABNORMAL LOW (ref 36.0–46.0)
Hemoglobin: 8.4 g/dL — ABNORMAL LOW (ref 12.0–15.0)
MCH: 25.5 pg — ABNORMAL LOW (ref 26.0–34.0)
MCHC: 32.7 g/dL (ref 30.0–36.0)
MCV: 78.1 fL — ABNORMAL LOW (ref 80.0–100.0)
PLATELETS: DECREASED 10*3/uL (ref 150–400)
RBC: 3.29 MIL/uL — ABNORMAL LOW (ref 3.87–5.11)
RDW: 18.7 % — ABNORMAL HIGH (ref 11.5–15.5)
WBC: 7.7 10*3/uL (ref 4.0–10.5)
nRBC: 0 % (ref 0.0–0.2)

## 2018-06-23 NOTE — Progress Notes (Signed)
PROGRESS NOTE    Samantha Santos  CWU:[id number] DOB: [birth year] DOA: 06/20/2018 PCP: [name], MD (Confirm with patient/family/NH records and if not entered, this HAS to be entered at [facility] point of entry. "No PCP" if truly none.)   Brief Narrative:  Samantha Santos is a 58 y.o. female with history of stage IV neuroendocrine tumor and hypertension who has undergone chemotherapy secondary to yesterday started of being weakness and lightheadedness when she was driving her grandson to school this morning.  Later she reached home and felt very weak and lay on the bed.  Denies any chest pain shortness of breath nausea vomiting or diarrhea.  Had subjective feeling of fever and chills.  Was feeling weak and late in the evening her friend persuaded to come to the ER and EMS was called.   ED Course: In the ER patient was hypotensive with mildly elevated lactate and significantly elevated leukocytosis.  Patient was empirically started on antibiotics for possible developing sepsis chest x-ray shows possibility of infiltrates and pleural effusion.  UA is pending.  Patient was given fluid bolus for possible sepsis.  Blood pressure improved with fluids.   Assessment & Plan:   Principal Problem:   SIRS (systemic inflammatory response syndrome) (HCC) Active Problems:   Essential hypertension   CKD (chronic kidney disease) stage 2, GFR 60-89 ml/min   ##Severe sepsis secondary to pneumonia -Patient admitted with hypotension, tachycardia, elevated white blood cell count, elevated lactic acid -Follow-up with blood cultures, urine cultures negative to date -Chest x-ray-basilar airspace disease -Continue with vancomycin, cefepime -De-escalate the antibiotics once culture data is available -Plan to discharge patient with levofloxacin  ##Acute kidney injury mild -Improving with IV fluids, normalizing the blood pressure -Normalized  ##Hypotension -Secondary to sepsis -Continue with IV fluids and  follow-up -Normalized  ##Thrombocytopenia -Platelets are trended down from 231->124 -Most likely chemo induced, closely follow-up  ##Leukocytosis -Patient received granulocyte stimulating factor injection after the chemo, possibility of infection -Trending down  ##Anemia chemo induced -Hemoglobin trended down from 12-9.1 today -Closely follow-up with CBC -No signs of any bleeding  ##Gastroesophageal reflux disease -Continue the Protonix  ##Hypertension -Holding all blood pressure medications  ##Neuroendocrine tumor stage IV -Last chemotherapy 2 days back    DVT prophylaxis: Lovenox Code Status: Full code  family Communication: Patient and 2 daughters Disposition Plan:  Home tomorrow  Procedures: Blood cultures negative to date  Antimicrobials:  Vancomycin 06/20/2018- Cefepime 06/20/2018-  Subjective: Patient states improvement from the time of the admission.  Is been afebrile in the last 24 hours Objective: Vitals:   06/23/18 0417 06/23/18 0419 06/23/18 0639 06/23/18 1022  BP:    107/78  Pulse:    79  Resp: 18 20  14   Temp:  98.4 F (36.9 C)  98.2 F (36.8 C)  TempSrc:  Oral  Oral  SpO2:    97%  Weight:   132.1 kg   Height:        Intake/Output Summary (Last 24 hours) at 06/23/2018 1306 Last data filed at 06/23/2018 0640 Gross per 24 hour  Intake 1000 ml  Output -  Net 1000 ml   Filed Weights   06/21/18 0425 06/22/18 0715 06/23/18 0639  Weight: 133.6 kg 132.9 kg 132.1 kg    Examination:  General exam: Appears calm and comfortable  Respiratory system: Clear to auscultation. Respiratory effort normal. Cardiovascular system: S1 & S2 heard, RRR. No JVD, murmurs, rubs, gallops or clicks. No pedal edema. Gastrointestinal system: Abdomen is nondistended, soft and  nontender. No organomegaly or masses felt. Normal bowel sounds heard. Central nervous system: Alert and oriented. No focal neurological deficits. Extremities: Symmetric 5 x 5 power. Skin: No  rashes, lesions or ulcers Psychiatry: Judgement and insight appear normal. Mood & affect appropriate.     Data Reviewed: I have personally reviewed following labs and imaging studies  CBC: Recent Labs  Lab 06/17/18 1109 06/20/18 1652 06/21/18 0216 06/22/18 0346 06/23/18 1125  WBC 5.9 23.6* 43.1* 23.3* 7.7  NEUTROABS 3.7 21.8* 40.9* 19.3*  --   HGB 10.9* 12.2 10.1* 9.1* 8.4*  HCT 34.0* 38.2 31.3* 28.6* 25.7*  MCV 80.2 80.8 80.5 79.2* 78.1*  PLT 150 231 184 124* PLATELET CLUMPS NOTED ON SMEAR, COUNT APPEARS DECREASED   Basic Metabolic Panel: Recent Labs  Lab 06/17/18 1109 06/20/18 1652 06/21/18 0216 06/22/18 0346 06/23/18 1006  NA 142 138 137 136 136  K 3.9 3.9 3.8 4.0 3.9  CL 104 106 110 108 105  CO2 29 23 22 22 24   GLUCOSE 101* 107* 94 103* 110*  BUN 16 24* 24* 15 13  CREATININE 1.05* 1.29* 1.17* 0.84 0.74  CALCIUM 9.0 7.8* 7.3* 7.5* 8.0*   GFR: Estimated Creatinine Clearance: 110 mL/min (by C-G formula based on SCr of 0.74 mg/dL). Liver Function Tests: Recent Labs  Lab 06/17/18 1109 06/20/18 1652 06/21/18 0216  AST 8* 16 10*  ALT 14 12 14   ALKPHOS 125 96 74  BILITOT 0.6 1.2 1.2  PROT 7.9 6.1* 5.5*  ALBUMIN 3.4* 2.7* 2.6*   Recent Labs  Lab 06/20/18 1652  LIPASE 26   No results for input(s): AMMONIA in the last 168 hours. Coagulation Profile: No results for input(s): INR, PROTIME in the last 168 hours. Cardiac Enzymes: Recent Labs  Lab 06/20/18 1652 06/20/18 2218 06/21/18 0216 06/21/18 1050  TROPONINI 0.04* 0.06* 0.05* 0.04*   BNP (last 3 results) No results for input(s): PROBNP in the last 8760 hours. HbA1C: No results for input(s): HGBA1C in the last 72 hours. CBG: No results for input(s): GLUCAP in the last 168 hours. Lipid Profile: No results for input(s): CHOL, HDL, LDLCALC, TRIG, CHOLHDL, LDLDIRECT in the last 72 hours. Thyroid Function Tests: No results for input(s): TSH, T4TOTAL, FREET4, T3FREE, THYROIDAB in the last 72  hours. Anemia Panel: No results for input(s): VITAMINB12, FOLATE, FERRITIN, TIBC, IRON, RETICCTPCT in the last 72 hours. Sepsis Labs: Recent Labs  Lab 06/20/18 1652 06/20/18 1957 06/20/18 2218 06/21/18 0216  LATICACIDVEN 2.5* 1.7 1.8 1.2    Recent Results (from the past 240 hour(s))  Urine culture     Status: None   Collection Time: 06/20/18  4:53 PM  Result Value Ref Range Status   Specimen Description URINE, CLEAN CATCH  Final   Special Requests Normal  Final   Culture   Final    NO GROWTH Performed at [facility], [place] [address], [place], [place] [address]    Report Status 06/22/2018 FINAL  Final  Blood Culture (routine x 2)     Status: None (Preliminary result)   Collection Time: 06/20/18  6:07 PM  Result Value Ref Range Status   Specimen Description BLOOD LEFT ANTECUBITAL  Final   Special Requests   Final    BOTTLES DRAWN AEROBIC AND ANAEROBIC Blood Culture adequate volume   Culture   Final    NO GROWTH 3 DAYS Performed at [facility], [place] [address], [place], [place] [address]    Report Status PENDING  Incomplete  Blood  Culture (routine x 2)     Status: None (Preliminary result)   Collection Time: 06/20/18  6:25 PM  Result Value Ref Range Status   Specimen Description BLOOD RIGHT ANTECUBITAL  Final   Special Requests   Final    BOTTLES DRAWN AEROBIC AND ANAEROBIC Blood Culture adequate volume   Culture   Final    NO GROWTH 3 DAYS Performed at [facility], [address], [place], [place] [address]    Report Status PENDING  Incomplete         Radiology Studies: No results found.      Scheduled Meds: . enoxaparin (LOVENOX) injection  40 mg Subcutaneous Q24H   Continuous Infusions: . ceFEPime (MAXIPIME) IV 2 g (06/23/18 0531)  . vancomycin 1,500 mg (06/22/18 2005)     LOS: 2 days    Time spent: 21 minutes   Mylani Gentry, MD [facility] Pager [phone]  If 7PM-7AM, please contact  night-coverage [email or web address] Password [email or web address] 06/23/2018, 1:06 PM

## 2018-06-24 ENCOUNTER — Inpatient Hospital Stay (HOSPITAL_COMMUNITY): Payer: Medicaid Other

## 2018-06-24 ENCOUNTER — Inpatient Hospital Stay: Payer: Medicaid Other

## 2018-06-24 DIAGNOSIS — J181 Lobar pneumonia, unspecified organism: Secondary | ICD-10-CM

## 2018-06-24 DIAGNOSIS — C7A8 Other malignant neuroendocrine tumors: Secondary | ICD-10-CM

## 2018-06-24 LAB — CBC
HCT: 25.4 % — ABNORMAL LOW (ref 36.0–46.0)
Hemoglobin: 8.1 g/dL — ABNORMAL LOW (ref 12.0–15.0)
MCH: 24.9 pg — ABNORMAL LOW (ref 26.0–34.0)
MCHC: 31.9 g/dL (ref 30.0–36.0)
MCV: 78.2 fL — ABNORMAL LOW (ref 80.0–100.0)
Platelets: 93 10*3/uL — ABNORMAL LOW (ref 150–400)
RBC: 3.25 MIL/uL — AB (ref 3.87–5.11)
RDW: 18.7 % — ABNORMAL HIGH (ref 11.5–15.5)
WBC: 3.2 10*3/uL — ABNORMAL LOW (ref 4.0–10.5)
nRBC: 0 % (ref 0.0–0.2)

## 2018-06-24 LAB — BASIC METABOLIC PANEL
Anion gap: 7 (ref 5–15)
BUN: 18 mg/dL (ref 6–20)
CO2: 26 mmol/L (ref 22–32)
Calcium: 8 mg/dL — ABNORMAL LOW (ref 8.9–10.3)
Chloride: 105 mmol/L (ref 98–111)
Creatinine, Ser: 0.75 mg/dL (ref 0.44–1.00)
GFR calc Af Amer: >60 mL/min (ref >60)
GFR calc non Af Amer: >60 mL/min (ref >60)
Glucose, Bld: 118 mg/dL — ABNORMAL HIGH (ref 70–99)
Potassium: 4 mmol/L (ref 3.5–5.1)
Sodium: 138 mmol/L (ref 135–145)

## 2018-06-24 MED ORDER — LEVOFLOXACIN 750 MG PO TABS
750.0000 mg | ORAL_TABLET | ORAL | Status: DC
  Administered 2018-06-24: 750 mg via ORAL
  Filled 2018-06-24: qty 1

## 2018-06-24 NOTE — Progress Notes (Signed)
Spoke to Binger, RN concerning consult to access PAC. Levada Dy, RN from [facility] accessed port twice with pink tinged fluid aspirated. She requested Fluro to assess port. This author spoke with Dr. Jonnie Finner, who just consulted [facility] to evaluate.

## 2018-06-24 NOTE — Progress Notes (Signed)
PROGRESS NOTE    Samantha Santos  GQQ:[id number] DOB: 1960/05/18 DOA: 06/20/2018 PCP: [name], MD (Confirm with patient/family/NH records and if not entered, this HAS to be entered at [facility] point of entry. "No PCP" if truly none.)   Brief Narrative:  Samantha Santos is a 58 y.o. female with history of stage IV neuroendocrine tumor and hypertension who has undergone chemotherapy secondary to yesterday started of being weakness and lightheadedness when she was driving her grandson to school this morning.  Later she reached home and felt very weak and lay on the bed.  Denies any chest pain shortness of breath nausea vomiting or diarrhea.  Had subjective feeling of fever and chills.  Was feeling weak and late in the evening her friend persuaded to come to the ER and EMS was called.   ED Course: In the ER patient was hypotensive with mildly elevated lactate and significantly elevated leukocytosis.  Patient was empirically started on antibiotics for possible developing sepsis chest x-ray shows possibility of infiltrates and pleural effusion.  UA is pending.  Patient was given fluid bolus for possible sepsis.  Blood pressure improved with fluids.   Assessment & Plan:   Principal Problem:   SIRS (systemic inflammatory response syndrome) (HCC) Active Problems:   Essential hypertension   CKD (chronic kidney disease) stage 2, GFR 60-89 ml/min   ##Severe sepsis secondary to pneumonia -Patient admitted with hypotension, tachycardia, elevated white blood cell count, elevated lactic acid, LLL retrocardiac density on CXR -Follow-up with blood cultures, urine cultures negative to date - SP 4 days IV vancomycin, cefepime, low grade fever/ hypotension resolved > changing to po levaquin today to complete 7d course -do CT chest noncon to eval L base, CXR findings / exam findings abnormal for last 1-76months , and hx of known cancer of left lung  ##Acute kidney injury mild -Improving with IV fluids,  normalizing the blood pressure -Normalized  ## IV access/ port - placed 2 wks ago by Dr Roxan Hockey.  IV team unable to access, but looks like it was used last week Mon- Wed for chemo. Will consult IR for this.   ##Hypotension -Secondary to sepsis, now resolved  ##Thrombocytopenia -Platelets are trended down from 231->124 -Most likely chemo induced, closely follow-up  ##Leukocytosis -Patient received granulocyte stimulating factor injection after the chemo, possibility of infection as well, on abx D#5, WBC down 43K >> 3K today -Trending down  ##Anemia chemo induced -Hemoglobin trended down from 12 >>> 8.1 today -Closely follow-up with CBC -No signs of any bleeding  ##Gastroesophageal reflux disease -Continue the Protonix  ##Hypertension -Holding all blood pressure medications, BP's low-normal  ##Neuroendocrine tumor stage IV - had 2nd round chemoRx Monday- wed last week    DVT prophylaxis: Lovenox Code Status: Full code  family Communication: Patient and daughter Disposition Plan:  Home hopefully soon  Procedures: Blood cultures negative to date  Antimicrobials:  Vancomycin 06/20/2018- Cefepime 06/20/2018-  Subjective: Patient states she feels good and fine, no cough or fevers.    Objective: Vitals:   06/24/18 0553 06/24/18 0556 06/24/18 0818 06/24/18 1136  BP:   116/76 112/78  Pulse:   90 79  Resp: 20  (!) 21 (!) 29  Temp:   98.1 F (36.7 C) 97.9 F (36.6 C)  TempSrc:   Oral Oral  SpO2:   100% 98%  Weight:  130.7 kg    Height:        Intake/Output Summary (Last 24 hours) at 06/24/2018 1644 Last data filed at 06/24/2018  0800 Gross per 24 hour  Intake 840 ml  Output -  Net 840 ml   Filed Weights   06/22/18 0715 06/23/18 0639 06/24/18 0556  Weight: 132.9 kg 132.1 kg 130.7 kg    Examination:  General exam: Appears calm and comfortable  Respiratory system: Clear on R, decreased BS w/ bronchial BS 1/2 up on L Cardiovascular system: S1 & S2 heard, RRR.  No JVD, murmurs, rubs, gallops or clicks. No pedal edema. Port L upper chest scar is intact, can't feel the port itself.  Gastrointestinal system: Abdomen is nondistended, soft and nontender. No organomegaly or masses felt. Normal bowel sounds heard. Central nervous system: Alert and oriented. No focal neurological deficits. Extremities: Symmetric 5 x 5 power. Skin: No rashes, lesions or ulcers Psychiatry: Judgement and insight appear normal. Mood & affect appropriate.     Data Reviewed: I have personally reviewed following labs and imaging studies  CBC: Recent Labs  Lab 06/20/18 1652 06/21/18 0216 06/22/18 0346 06/23/18 1125 06/24/18 0325  WBC 23.6* 43.1* 23.3* 7.7 3.2*  NEUTROABS 21.8* 40.9* 19.3*  --   --   HGB 12.2 10.1* 9.1* 8.4* 8.1*  HCT 38.2 31.3* 28.6* 25.7* 25.4*  MCV 80.8 80.5 79.2* 78.1* 78.2*  PLT 231 184 124* PLATELET CLUMPS NOTED ON SMEAR, COUNT APPEARS DECREASED 93*   Basic Metabolic Panel: Recent Labs  Lab 06/20/18 1652 06/21/18 0216 06/22/18 0346 06/23/18 1006 06/24/18 0325  NA 138 137 136 136 138  K 3.9 3.8 4.0 3.9 4.0  CL 106 110 108 105 105  CO2 23 22 22 24 26   GLUCOSE 107* 94 103* 110* 118*  BUN 24* 24* 15 13 18   CREATININE 1.29* 1.17* 0.84 0.74 0.75  CALCIUM 7.8* 7.3* 7.5* 8.0* 8.0*   GFR: Estimated Creatinine Clearance: 109.3 mL/min (by C-G formula based on SCr of 0.75 mg/dL). Liver Function Tests: Recent Labs  Lab 06/20/18 1652 06/21/18 0216  AST 16 10*  ALT 12 14  ALKPHOS 96 74  BILITOT 1.2 1.2  PROT 6.1* 5.5*  ALBUMIN 2.7* 2.6*   Recent Labs  Lab 06/20/18 1652  LIPASE 26   No results for input(s): AMMONIA in the last 168 hours. Coagulation Profile: No results for input(s): INR, PROTIME in the last 168 hours. Cardiac Enzymes: Recent Labs  Lab 06/20/18 1652 06/20/18 2218 06/21/18 0216 06/21/18 1050  TROPONINI 0.04* 0.06* 0.05* 0.04*   BNP (last 3 results) No results for input(s): PROBNP in the last 8760  hours. HbA1C: No results for input(s): HGBA1C in the last 72 hours. CBG: No results for input(s): GLUCAP in the last 168 hours. Lipid Profile: No results for input(s): CHOL, HDL, LDLCALC, TRIG, CHOLHDL, LDLDIRECT in the last 72 hours. Thyroid Function Tests: No results for input(s): TSH, T4TOTAL, FREET4, T3FREE, THYROIDAB in the last 72 hours. Anemia Panel: No results for input(s): VITAMINB12, FOLATE, FERRITIN, TIBC, IRON, RETICCTPCT in the last 72 hours. Sepsis Labs: Recent Labs  Lab 06/20/18 1652 06/20/18 1957 06/20/18 2218 06/21/18 0216  LATICACIDVEN 2.5* 1.7 1.8 1.2    Recent Results (from the past 240 hour(s))  Urine culture     Status: None   Collection Time: 06/20/18  4:53 PM  Result Value Ref Range Status   Specimen Description URINE, CLEAN CATCH  Final   Special Requests Normal  Final   Culture   Final    NO GROWTH Performed at [facility], [place] [address], [place], [place] [address]    Report Status 06/22/2018 FINAL  Final  Blood Culture (routine x 2)     Status: None (Preliminary result)   Collection Time: 06/20/18  6:07 PM  Result Value Ref Range Status   Specimen Description BLOOD LEFT ANTECUBITAL  Final   Special Requests   Final    BOTTLES DRAWN AEROBIC AND ANAEROBIC Blood Culture adequate volume   Culture   Final    NO GROWTH 4 DAYS Performed at [facility], [address] [place], [place] [address]    Report Status PENDING  Incomplete  Blood Culture (routine x 2)     Status: None (Preliminary result)   Collection Time: 06/20/18  6:25 PM  Result Value Ref Range Status   Specimen Description BLOOD RIGHT ANTECUBITAL  Final   Special Requests   Final    BOTTLES DRAWN AEROBIC AND ANAEROBIC Blood Culture adequate volume   Culture   Final    NO GROWTH 4 DAYS Performed at [facility], [place] [address], [place], [place] [address]    Report Status PENDING  Incomplete         Radiology Studies: No results  found.      Scheduled Meds: . enoxaparin (LOVENOX) injection  40 mg Subcutaneous Q24H  . levofloxacin  750 mg Oral Q24H   Continuous Infusions:    LOS: 3 days    If 7PM-7AM, please contact night-coverage [email or web address] Password [facility] 06/24/2018, 4:44 PM

## 2018-06-25 ENCOUNTER — Inpatient Hospital Stay (HOSPITAL_COMMUNITY): Payer: Medicaid Other

## 2018-06-25 ENCOUNTER — Encounter (HOSPITAL_COMMUNITY): Payer: Self-pay | Admitting: Interventional Radiology

## 2018-06-25 DIAGNOSIS — C3492 Malignant neoplasm of unspecified part of left bronchus or lung: Secondary | ICD-10-CM

## 2018-06-25 DIAGNOSIS — J189 Pneumonia, unspecified organism: Secondary | ICD-10-CM

## 2018-06-25 DIAGNOSIS — I959 Hypotension, unspecified: Secondary | ICD-10-CM

## 2018-06-25 DIAGNOSIS — Z789 Other specified health status: Secondary | ICD-10-CM

## 2018-06-25 HISTORY — PX: IR CV LINE INJECTION: IMG2294

## 2018-06-25 LAB — CBC
HCT: 24.9 % — ABNORMAL LOW (ref 36.0–46.0)
Hemoglobin: 7.9 g/dL — ABNORMAL LOW (ref 12.0–15.0)
MCH: 24.9 pg — AB (ref 26.0–34.0)
MCHC: 31.7 g/dL (ref 30.0–36.0)
MCV: 78.5 fL — AB (ref 80.0–100.0)
Platelets: 76 10*3/uL — ABNORMAL LOW (ref 150–400)
RBC: 3.17 MIL/uL — ABNORMAL LOW (ref 3.87–5.11)
RDW: 18.4 % — ABNORMAL HIGH (ref 11.5–15.5)
WBC: 1.3 10*3/uL — CL (ref 4.0–10.5)
nRBC: 0 % (ref 0.0–0.2)

## 2018-06-25 LAB — CULTURE, BLOOD (ROUTINE X 2)
Culture: NO GROWTH
Culture: NO GROWTH
SPECIAL REQUESTS: ADEQUATE
Special Requests: ADEQUATE

## 2018-06-25 MED ORDER — IOPAMIDOL (ISOVUE-300) INJECTION 61%
INTRAVENOUS | Status: AC
  Filled 2018-06-25: qty 100

## 2018-06-25 MED ORDER — HEPARIN SOD (PORK) LOCK FLUSH 100 UNIT/ML IV SOLN
INTRAVENOUS | Status: AC
  Filled 2018-06-25: qty 5

## 2018-06-25 MED ORDER — LEVOFLOXACIN 750 MG PO TABS: 750.0000 mg | ORAL_TABLET | ORAL | 0 refills | Status: AC

## 2018-06-25 NOTE — Progress Notes (Signed)
CRITICAL VALUE ALERT  Critical Value:  WBC 1.3  Date & Time Notied:  06/25/2018  Provider Notified: K.Kirby NP  Orders Received/Actions taken: continue to monitor

## 2018-06-25 NOTE — Progress Notes (Signed)
Patient and family with questions this AM, This RN paged Dr. Jonnie Finner through [facility] system and during call back stated he would be up soon to see patient. This RN also called IR to inquire time pt would be seen today. IR explained patient on list but no time given will continue to monitor patient. Lizzet Hendley, Bettina Gavia rN

## 2018-06-25 NOTE — Discharge Summary (Signed)
Physician Discharge Summary  Patient ID: Samantha Santos MRN: [id number] DOB/AGE: 10/25/1960 58 y.o.  Admit date: 06/20/2018 Discharge date: 06/25/2018  Admission Diagnoses:   SIRS (systemic inflammatory response syndrome)   Pneumonia   Essential hypertension   CKD III   Lung cancer stage IV  Discharge Diagnoses:    SIRS (systemic inflammatory response syndrome)   Pneumonia   Leukopenia   Essential hypertension   CKD III   Lung cancer stage IV   Discharged Condition: good  Presentation Summary: Samantha Santos a 58 y.o.femalewithhistory of stage IV neuroendocrine tumor and hypertension who has undergone chemotherapy secondary to yesterday started of being weakness and lightheadedness when she was driving her grandson to school this morning. Later she reached home and felt very weak and lay on the bed. Denies any chest pain shortness of breath nausea vomiting or diarrhea. Had subjective feeling of fever and chills. Was feeling weak and late in the evening her friend persuaded to come to the ER and EMS was called.  ED Course:In the ER patient was hypotensive with mildly elevated lactate and significantly elevated leukocytosis. Patient was empirically started on antibiotics for possible developing sepsis chest x-ray shows possibility of infiltrates and pleural effusion. UA is pending. Patient was given fluid bolus for possible sepsis. Blood pressure improved with fluids.  Hospital Course:  # SIRS/ sepsis secondary to pneumonia -Patient admitted with hypotension, tachycardia, elevated white blood cell count, elevated lactic acid, LLL retrocardiac density on CXR - blood cultures, urine cultures are negative to date, CT chest noncon showed consolidation vs atx a left base - pt responded well to IV abx and low grade fevers/ malaise resolved completely - WBC peaked at 43K (due to Neulasta) then dropped to 3K yest and 1.3K today; I spoke to Dr Julien Nordmann, [place] to dc home regardless of  WBC, extend the levaquin x 5 more days to cover the WBC nadir - afebrile, changed to po levaquin yesterday, will get 6 more days (7d for PNA + 5d for WBC nadir)   ##Acute kidney injury mild - resolved w/ IVF's  ## IV access/ port - placed 2 wks ago by Dr Roxan Hockey.  IV team unable to access, but looks like it was used last week Mon- Wed for chemo  - IR aware, for procedure today to make sure port is functional   ##Hypotension -Secondary to sepsis > resolved  ##Thrombocytopenia -Platelets are trended down from 231->124 -Most likely chemo induced, closely follow-up  ##Anemia chemo induced -Hemoglobin trended down from 12 >>> 7.9. this is due to chemoRx, normal responses, have d/w pt's oncologist  -No signs of any bleeding  ##Gastroesophageal reflux disease -Continue the Protonix  ##Hypertension - holding home losartan and HCTZ. BP's low. These can be restarted at a later date when needed.   ##Neuroendocrine tumor stage IV - had 2nd round chemoRx Monday- Wed last week - per oncology     Antimicrobials:  Vancomycin 06/20/2018- 06/23/18 Cefepime 06/20/2018- 06/23/18 Levaquin 3/9 - dc   Discharge Exam: Blood pressure 100/78, pulse 91, temperature (!) 97.5 F (36.4 C), temperature source Oral, resp. rate (!) 21, height 5\' 7"  (1.702 m), weight 130.7 kg, SpO2 98 %. General exam: Appears calm and comfortable, in good spirits  Respiratory system: Clear on R, decreased BS w/ bronchial BS 1/2 up on L Cardiovascular system: S1 & S2 heard, RRR. No JVD, murmurs, rubs, gallops or clicks. No pedal edema. Port L upper chest scar is intact, can't feel the port itself.  Gastrointestinal system:  Abdomen is nondistended, soft and nontender. No organomegaly or masses felt. Normal bowel sounds heard. Central nervous system: Alert and oriented. No focal neurological deficits. Extremities: Symmetric 5 x 5 power. Skin: No rashes, lesions or ulcers Psychiatry: Judgement and insight appear  normal. Mood & affect appropriate.     Disposition: Discharge disposition: 01-Home or Self Care        Allergies as of 06/25/2018      Reactions   Penicillins Other (See Comments)   Syncope Did it involve swelling of the face/tongue/throat, SOB, or low BP? No Did it involve sudden or severe rash/hives, skin peeling, or any reaction on the inside of your mouth or nose? No Did you need to seek medical attention at a hospital or doctor's office? Yes When did it last happen?over 20 years ago If all above answers are "NO", may proceed with cephalosporin use.   Oxycodone Other (See Comments)   hallucinations      Medication List    STOP taking these medications   ibuprofen 200 MG tablet Commonly known as:  ADVIL,MOTRIN   losartan-hydrochlorothiazide 100-12.5 MG tablet Commonly known as:  HYZAAR     TAKE these medications   levofloxacin 750 MG tablet Commonly known as:  LEVAQUIN Take 1 tablet (750 mg total) by mouth daily for 6 days.   lidocaine-prilocaine cream Commonly known as:  EMLA Apply 1 application topically as needed. What changed:  reasons to take this   loratadine 10 MG tablet Commonly known as:  CLARITIN Take 10 mg by mouth daily.   Potassium Chloride ER 20 MEQ Tbcr Take 20 mEq by mouth daily.   prochlorperazine 10 MG tablet Commonly known as:  COMPAZINE Take 1 tablet (10 mg total) by mouth every 6 (six) hours as needed for nausea or vomiting.   traMADol 50 MG tablet Commonly known as:  Ultram Take 1 tablet (50 mg total) by mouth every 6 (six) hours as needed.        Signed: Sandy Salaam Tsuneo Faison 06/25/2018, 4:06 PM

## 2018-06-26 ENCOUNTER — Telehealth: Payer: Self-pay | Admitting: *Deleted

## 2018-06-26 NOTE — Telephone Encounter (Signed)
06-25-2018: Samantha Santos's daughter calling to notify you My mother was hospitalized since Thursday.  Missed her appointment for lab yesterday.  The nurses said the did not hear from the office at all.  Can you all see and use the hospital results?"  Conveyed results are visible for [facility] and offices as we use the same computer system.  Asked for call to notify of discharge.   Denies further questions or needs.

## 2018-07-01 ENCOUNTER — Inpatient Hospital Stay: Payer: Medicaid Other

## 2018-07-01 ENCOUNTER — Other Ambulatory Visit: Payer: Self-pay

## 2018-07-01 DIAGNOSIS — C7A1 Malignant poorly differentiated neuroendocrine tumors: Secondary | ICD-10-CM | POA: Diagnosis present

## 2018-07-01 DIAGNOSIS — Z5111 Encounter for antineoplastic chemotherapy: Secondary | ICD-10-CM | POA: Diagnosis present

## 2018-07-01 DIAGNOSIS — C3412 Malignant neoplasm of upper lobe, left bronchus or lung: Secondary | ICD-10-CM

## 2018-07-01 DIAGNOSIS — Z79899 Other long term (current) drug therapy: Secondary | ICD-10-CM | POA: Diagnosis not present

## 2018-07-01 DIAGNOSIS — Z791 Long term (current) use of non-steroidal anti-inflammatories (NSAID): Secondary | ICD-10-CM | POA: Diagnosis not present

## 2018-07-01 DIAGNOSIS — T451X5D Adverse effect of antineoplastic and immunosuppressive drugs, subsequent encounter: Secondary | ICD-10-CM | POA: Diagnosis not present

## 2018-07-01 DIAGNOSIS — Z5112 Encounter for antineoplastic immunotherapy: Secondary | ICD-10-CM | POA: Diagnosis not present

## 2018-07-01 DIAGNOSIS — E876 Hypokalemia: Secondary | ICD-10-CM | POA: Diagnosis not present

## 2018-07-01 DIAGNOSIS — Z95828 Presence of other vascular implants and grafts: Secondary | ICD-10-CM

## 2018-07-01 DIAGNOSIS — Z5189 Encounter for other specified aftercare: Secondary | ICD-10-CM | POA: Diagnosis not present

## 2018-07-01 DIAGNOSIS — D6481 Anemia due to antineoplastic chemotherapy: Secondary | ICD-10-CM | POA: Diagnosis not present

## 2018-07-01 DIAGNOSIS — I10 Essential (primary) hypertension: Secondary | ICD-10-CM | POA: Diagnosis not present

## 2018-07-01 LAB — CMP (CANCER CENTER ONLY)
ALT: 17 U/L (ref 0–44)
AST: 8 U/L — AB (ref 15–41)
Albumin: 3.1 g/dL — ABNORMAL LOW (ref 3.5–5.0)
Alkaline Phosphatase: 106 U/L (ref 38–126)
Anion gap: 12 (ref 5–15)
BUN: 7 mg/dL (ref 6–20)
CO2: 25 mmol/L (ref 22–32)
Calcium: 8.3 mg/dL — ABNORMAL LOW (ref 8.9–10.3)
Chloride: 107 mmol/L (ref 98–111)
Creatinine: 0.96 mg/dL (ref 0.44–1.00)
GFR, Est AFR Am: >60 mL/min (ref >60)
GFR, Estimated: >60 mL/min (ref >60)
Glucose, Bld: 88 mg/dL (ref 70–99)
Potassium: 3.8 mmol/L (ref 3.5–5.1)
Sodium: 144 mmol/L (ref 135–145)
Total Bilirubin: 0.6 mg/dL (ref 0.3–1.2)
Total Protein: 7.1 g/dL (ref 6.5–8.1)

## 2018-07-01 LAB — CBC WITH DIFFERENTIAL (CANCER CENTER ONLY)
ABS IMMATURE GRANULOCYTES: 1.91 10*3/uL — AB (ref 0.00–0.07)
Basophils Absolute: 0 10*3/uL (ref 0.0–0.1)
Basophils Relative: 0 %
Eosinophils Absolute: 0 10*3/uL (ref 0.0–0.5)
Eosinophils Relative: 0 %
HCT: 27.1 % — ABNORMAL LOW (ref 36.0–46.0)
HEMOGLOBIN: 8.7 g/dL — AB (ref 12.0–15.0)
Immature Granulocytes: 19 %
Lymphocytes Relative: 19 %
Lymphs Abs: 1.9 10*3/uL (ref 0.7–4.0)
MCH: 26.2 pg (ref 26.0–34.0)
MCHC: 32.1 g/dL (ref 30.0–36.0)
MCV: 81.6 fL (ref 80.0–100.0)
Monocytes Absolute: 1.1 10*3/uL — ABNORMAL HIGH (ref 0.1–1.0)
Monocytes Relative: 11 %
Neutro Abs: 5 10*3/uL (ref 1.7–7.7)
Neutrophils Relative %: 51 %
Platelet Count: 50 10*3/uL — ABNORMAL LOW (ref 150–400)
RBC: 3.32 MIL/uL — ABNORMAL LOW (ref 3.87–5.11)
RDW: 20.6 % — ABNORMAL HIGH (ref 11.5–15.5)
WBC Count: 9.9 10*3/uL (ref 4.0–10.5)
nRBC: 1.2 % — ABNORMAL HIGH (ref 0.0–0.2)

## 2018-07-01 MED ORDER — HEPARIN SOD (PORK) LOCK FLUSH 100 UNIT/ML IV SOLN
500.0000 [IU] | Freq: Once | INTRAVENOUS | Status: AC | PRN
  Administered 2018-07-01: 500 [IU]
  Filled 2018-07-01: qty 5

## 2018-07-01 MED ORDER — SODIUM CHLORIDE 0.9% FLUSH
10.0000 mL | INTRAVENOUS | Status: DC | PRN
  Administered 2018-07-01: 10 mL
  Filled 2018-07-01: qty 10

## 2018-07-04 ENCOUNTER — Telehealth: Payer: Self-pay | Admitting: Medical Oncology

## 2018-07-04 NOTE — Telephone Encounter (Signed)
Sneezing and coughing- Denies fever.What can she take for cough ? per Julien Nordmann I instructed daughter to give pt Delysm.

## 2018-07-08 ENCOUNTER — Inpatient Hospital Stay: Payer: Medicaid Other

## 2018-07-08 ENCOUNTER — Telehealth: Payer: Self-pay | Admitting: Internal Medicine

## 2018-07-08 ENCOUNTER — Inpatient Hospital Stay (HOSPITAL_BASED_OUTPATIENT_CLINIC_OR_DEPARTMENT_OTHER): Payer: Medicaid Other | Admitting: Internal Medicine

## 2018-07-08 ENCOUNTER — Other Ambulatory Visit: Payer: Self-pay

## 2018-07-08 ENCOUNTER — Other Ambulatory Visit: Payer: Self-pay | Admitting: Internal Medicine

## 2018-07-08 ENCOUNTER — Encounter: Payer: Self-pay | Admitting: Internal Medicine

## 2018-07-08 VITALS — BP 141/100 | HR 96 | Temp 98.2°F | Resp 18 | Ht 67.0 in | Wt 287.8 lb

## 2018-07-08 DIAGNOSIS — C3492 Malignant neoplasm of unspecified part of left bronchus or lung: Secondary | ICD-10-CM

## 2018-07-08 DIAGNOSIS — C3412 Malignant neoplasm of upper lobe, left bronchus or lung: Secondary | ICD-10-CM

## 2018-07-08 DIAGNOSIS — I10 Essential (primary) hypertension: Secondary | ICD-10-CM

## 2018-07-08 DIAGNOSIS — Z791 Long term (current) use of non-steroidal anti-inflammatories (NSAID): Secondary | ICD-10-CM

## 2018-07-08 DIAGNOSIS — Z79899 Other long term (current) drug therapy: Secondary | ICD-10-CM

## 2018-07-08 DIAGNOSIS — E876 Hypokalemia: Secondary | ICD-10-CM

## 2018-07-08 DIAGNOSIS — D6481 Anemia due to antineoplastic chemotherapy: Secondary | ICD-10-CM

## 2018-07-08 DIAGNOSIS — Z5111 Encounter for antineoplastic chemotherapy: Secondary | ICD-10-CM

## 2018-07-08 DIAGNOSIS — C7A1 Malignant poorly differentiated neuroendocrine tumors: Secondary | ICD-10-CM

## 2018-07-08 DIAGNOSIS — Z5112 Encounter for antineoplastic immunotherapy: Secondary | ICD-10-CM | POA: Diagnosis not present

## 2018-07-08 DIAGNOSIS — Z95828 Presence of other vascular implants and grafts: Secondary | ICD-10-CM

## 2018-07-08 DIAGNOSIS — R5382 Chronic fatigue, unspecified: Secondary | ICD-10-CM

## 2018-07-08 LAB — CBC WITH DIFFERENTIAL (CANCER CENTER ONLY)
Abs Immature Granulocytes: 0.09 10*3/uL — ABNORMAL HIGH (ref 0.00–0.07)
Basophils Absolute: 0 10*3/uL (ref 0.0–0.1)
Basophils Relative: 0 %
Eosinophils Absolute: 0 10*3/uL (ref 0.0–0.5)
Eosinophils Relative: 0 %
HEMATOCRIT: 30.9 % — AB (ref 36.0–46.0)
HEMOGLOBIN: 9.7 g/dL — AB (ref 12.0–15.0)
Immature Granulocytes: 2 %
LYMPHS PCT: 33 %
Lymphs Abs: 1.9 10*3/uL (ref 0.7–4.0)
MCH: 26.5 pg (ref 26.0–34.0)
MCHC: 31.4 g/dL (ref 30.0–36.0)
MCV: 84.4 fL (ref 80.0–100.0)
Monocytes Absolute: 0.7 10*3/uL (ref 0.1–1.0)
Monocytes Relative: 12 %
Neutro Abs: 3 10*3/uL (ref 1.7–7.7)
Neutrophils Relative %: 53 %
Platelet Count: 162 10*3/uL (ref 150–400)
RBC: 3.66 MIL/uL — ABNORMAL LOW (ref 3.87–5.11)
RDW: 23.9 % — ABNORMAL HIGH (ref 11.5–15.5)
WBC Count: 5.6 10*3/uL (ref 4.0–10.5)
nRBC: 2.9 % — ABNORMAL HIGH (ref 0.0–0.2)

## 2018-07-08 LAB — CMP (CANCER CENTER ONLY)
ALK PHOS: 108 U/L (ref 38–126)
ALT: 20 U/L (ref 0–44)
AST: 13 U/L — ABNORMAL LOW (ref 15–41)
Albumin: 3.5 g/dL (ref 3.5–5.0)
Anion gap: 10 (ref 5–15)
BILIRUBIN TOTAL: 0.6 mg/dL (ref 0.3–1.2)
BUN: 11 mg/dL (ref 6–20)
CO2: 24 mmol/L (ref 22–32)
Calcium: 8.4 mg/dL — ABNORMAL LOW (ref 8.9–10.3)
Chloride: 108 mmol/L (ref 98–111)
Creatinine: 0.85 mg/dL (ref 0.44–1.00)
GFR, Est AFR Am: >60 mL/min (ref >60)
GFR, Estimated: >60 mL/min (ref >60)
Glucose, Bld: 101 mg/dL — ABNORMAL HIGH (ref 70–99)
Potassium: 4.2 mmol/L (ref 3.5–5.1)
Sodium: 142 mmol/L (ref 135–145)
Total Protein: 7.7 g/dL (ref 6.5–8.1)

## 2018-07-08 LAB — TSH: TSH: 0.712 u[IU]/mL (ref 0.308–3.960)

## 2018-07-08 MED ORDER — SODIUM CHLORIDE 0.9 % IV SOLN
100.0000 mg/m2 | Freq: Once | INTRAVENOUS | Status: AC
  Administered 2018-07-08: 250 mg via INTRAVENOUS
  Filled 2018-07-08: qty 12.5

## 2018-07-08 MED ORDER — SODIUM CHLORIDE 0.9% FLUSH
10.0000 mL | INTRAVENOUS | Status: DC | PRN
  Administered 2018-07-08: 10 mL
  Filled 2018-07-08: qty 10

## 2018-07-08 MED ORDER — PALONOSETRON HCL INJECTION 0.25 MG/5ML
0.2500 mg | Freq: Once | INTRAVENOUS | Status: AC
  Administered 2018-07-08: 0.25 mg via INTRAVENOUS

## 2018-07-08 MED ORDER — PALONOSETRON HCL INJECTION 0.25 MG/5ML
INTRAVENOUS | Status: AC
  Filled 2018-07-08: qty 5

## 2018-07-08 MED ORDER — SODIUM CHLORIDE 0.9 % IV SOLN
1200.0000 mg | Freq: Once | INTRAVENOUS | Status: AC
  Administered 2018-07-08: 1200 mg via INTRAVENOUS
  Filled 2018-07-08: qty 20

## 2018-07-08 MED ORDER — SODIUM CHLORIDE 0.9 % IV SOLN
Freq: Once | INTRAVENOUS | Status: AC
  Administered 2018-07-08: 13:00:00 via INTRAVENOUS
  Filled 2018-07-08: qty 5

## 2018-07-08 MED ORDER — SODIUM CHLORIDE 0.9 % IV SOLN
750.0000 mg | Freq: Once | INTRAVENOUS | Status: AC
  Administered 2018-07-08: 750 mg via INTRAVENOUS
  Filled 2018-07-08: qty 75

## 2018-07-08 MED ORDER — HEPARIN SOD (PORK) LOCK FLUSH 100 UNIT/ML IV SOLN
500.0000 [IU] | Freq: Once | INTRAVENOUS | Status: AC | PRN
  Administered 2018-07-08: 500 [IU]
  Filled 2018-07-08: qty 5

## 2018-07-08 MED ORDER — SODIUM CHLORIDE 0.9 % IV SOLN
Freq: Once | INTRAVENOUS | Status: AC
  Administered 2018-07-08: 13:00:00 via INTRAVENOUS
  Filled 2018-07-08: qty 250

## 2018-07-08 NOTE — Progress Notes (Signed)
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  PRIOR THERAPY: None  CURRENT THERAPY: Carboplatin for AUC of 5 on day 1, etoposide 100 mg/M2 on days 1, 2 and 3 with Neulasta support in addition to [place].First dose February 10th, 2020. Status post 2 cycles  INTERVAL HISTORY: Samantha Santos 58 y.o. female returns to the clinic today for returns to the clinic today for follow-up visit.  The patient is feeling fine today with no concerning complaints.  She was admitted to the hospital few weeks ago after cycle #2 with confusion as well as weakness and fatigue.  Her hemoglobin at that time was down to 7.9 with low neutrophil count.  The patient was treated with IV antibiotics as well as IV hydration and she is feeling better. She had CT scan of the chest during her hospitalization.  She denied having any current fever or chills.  She has no nausea, vomiting, diarrhea or constipation.  She denied having any headache or visual changes.  MEDICAL HISTORY: Past Medical History:  Diagnosis Date   Anemia    Arthritis    GERD (gastroesophageal reflux disease)    Hypertension    Morbid obesity ([place]) 05/06/2018   Supraclavicular adenopathy     ALLERGIES:  is allergic to penicillins and oxycodone.  MEDICATIONS:  Current Outpatient Medications  Medication Sig Dispense Refill   lidocaine-prilocaine (EMLA) cream Apply 1 application topically as needed. (Patient taking differently: Apply 1 application topically as needed (chemo port). ) 30 g 0   loratadine (CLARITIN) 10 MG tablet Take 10 mg by mouth daily.     Potassium Chloride ER 20 MEQ TBCR Take 20 mEq by mouth daily. 10 tablet 0   prochlorperazine (COMPAZINE) 10 MG tablet Take  1 tablet (10 mg total) by mouth every 6 (six) hours as needed for nausea or vomiting. 30 tablet 0   traMADol (ULTRAM) 50 MG tablet Take 1 tablet (50 mg total) by mouth every 6 (six) hours as needed. (Patient not taking: Reported on 06/20/2018) 20 tablet 0   No current facility-administered medications for this visit.     SURGICAL HISTORY:  Past Surgical History:  Procedure Laterality Date   CESAREAN SECTION  1988   HYSTEROSCOPY W/D&C  06/14/2011   Procedure: DILATATION AND CURETTAGE /HYSTEROSCOPY;  Surgeon: Frederico Hamman, MD;  Location: [facility];  Service: Gynecology;  Laterality: N/A;   IR CV LINE INJECTION  06/25/2018   PORTACATH PLACEMENT Left 06/13/2018   Procedure: INSERTION PORT-A-CATH;  Surgeon: [name], MD;  Location: [place];  Service: Thoracic;  Laterality: Left;   SUPRACLAVICAL NODE BIOPSY Right 05/10/2018   Procedure: SUPRACLAVICAL NODE BIOPSY;  Surgeon: [name], MD;  Location: [facility];  Service: Thoracic;  Laterality: Right;   TUBAL LIGATION      REVIEW OF SYSTEMS:  A comprehensive review of systems was negative except for: Constitutional: positive for fatigue   PHYSICAL EXAMINATION: General appearance: alert, cooperative, fatigued and no distress Head: Normocephalic, without obvious abnormality, atraumatic Neck: no adenopathy, no JVD, supple, symmetrical, trachea midline and thyroid not enlarged, symmetric, no tenderness/mass/nodules Lymph nodes: Cervical, supraclavicular, and axillary nodes normal. Resp: clear to auscultation bilaterally Back: symmetric, no curvature. ROM normal. No CVA tenderness. Cardio: regular rate and  rhythm, S1, S2 normal, no murmur, click, rub or gallop GI: soft, non-tender; bowel sounds normal; no masses,  no organomegaly Extremities: extremities normal, atraumatic, no cyanosis or edema  ECOG PERFORMANCE STATUS: 1 - Symptomatic but completely ambulatory  Blood pressure (!) 141/100, pulse 96, temperature 98.2 F  (36.8 C), temperature source Oral, resp. rate 18, height 5\' 7"  (1.702 m), weight 287 lb 12.8 oz (130.5 kg), SpO2 100 %.  LABORATORY DATA: Lab Results  Component Value Date   WBC 5.6 07/08/2018   HGB 9.7 (L) 07/08/2018   HCT 30.9 (L) 07/08/2018   MCV 84.4 07/08/2018   PLT 162 07/08/2018      Chemistry      Component Value Date/Time   NA 142 07/08/2018 0953   NA 144 04/08/2018 1145   K 4.2 07/08/2018 0953   CL 108 07/08/2018 0953   CO2 24 07/08/2018 0953   BUN 11 07/08/2018 0953   BUN 10 04/08/2018 1145   CREATININE 0.85 07/08/2018 0953      Component Value Date/Time   CALCIUM 8.4 (L) 07/08/2018 0953   ALKPHOS 108 07/08/2018 0953   AST 13 (L) 07/08/2018 0953   ALT 20 07/08/2018 0953   BILITOT 0.6 07/08/2018 0953       RADIOGRAPHIC STUDIES: Dg Chest 2 View  Result Date: 06/13/2018 CLINICAL DATA:  Preoperative radiograph. EXAM: CHEST - 2 VIEW COMPARISON:  05/10/2018 FINDINGS: Enlarged cardiac silhouette. Stable mediastinal lymphadenopathy. Stable eventration of the left hemidiaphragm with left lower lobe subsegmental atelectasis. There is no evidence of focal airspace consolidation, pleural effusion or pneumothorax. Osseous structures are without acute abnormality. Soft tissues are grossly normal. IMPRESSION: 1. Stable mediastinal lymphadenopathy. 2. Stable eventration of the left hemidiaphragm with left lower lobe subsegmental atelectasis. 3. Enlarged cardiac silhouette. Electronically Signed   By: Fidela Salisbury M.D.   On: 06/13/2018 12:18   Ct Chest Wo Contrast  Result Date: 06/24/2018 CLINICAL DATA:  Pneumonia. EXAM: CT CHEST WITHOUT CONTRAST TECHNIQUE: Multidetector CT imaging of the chest was performed following the standard protocol without IV contrast. COMPARISON:  CT scan of April 06, 2018. FINDINGS: Cardiovascular: No significant vascular findings. Normal heart size. Minimal pericardial effusion. Left internal jugular Port-A-Cath is noted. Mediastinum/Nodes:  Small sliding-type hiatal hernia is noted. Thyroid gland is unremarkable. Extensive mediastinal adenopathy is again noted, although it is difficult to evaluate due to lack of intravenous contrast. Anterior mediastinal adenopathy measuring 5.5 x 4.0 cm is noted. Lungs/Pleura: No pneumothorax is noted. Minimal right posterior basilar subsegmental atelectasis is noted. Left lower lobe pneumonia or atelectasis is noted with minimal pleural effusion. Upper Abdomen: No acute abnormality. Musculoskeletal: No chest wall mass or suspicious bone lesions identified. IMPRESSION: Extensive mediastinal adenopathy is again noted, with anterior mediastinal adenopathy measuring 5.5 x 4.0 cm. Overall however, it is difficult to evaluate the size of adenopathy given the lack of intravenous contrast. Minimal pericardial effusion. Left lower lobe pneumonia or atelectasis is noted with minimal pleural effusion. Electronically Signed   By: Marijo Conception, M.D.   On: 06/24/2018 19:37   Ir Cv Line Injection  Result Date: 06/25/2018 INDICATION: 58 year old female with a surgically placed left IJ approach port catheter. The port catheter was placed on 06/13/2018. Nurses have been having difficulty accessing the port. Patient presents for port check. EXAM: CENTRAL VENOUS CATHETER MEDICATIONS: None ANESTHESIA/SEDATION: None FLUOROSCOPY TIME:  Fluoroscopy Time: 0 minutes 30 seconds (22 mGy). COMPLICATIONS: None immediate. PROCEDURE: Informed written consent was obtained from the patient after a thorough discussion of  the procedural risks, benefits and alternatives. All questions were addressed. A timeout was performed prior to the initiation of the procedure. The skin overlying the port catheter was sterilely prepped in the standard fashion using chlorhexidine. Fluoroscopic imaging demonstrates a left IJ approach single-lumen power injectable port catheter. The catheter tip overlies the upper RA. Multiple attempts were made to access the  port catheter. The port catheter is tilted slightly posterior at the caudal tip. Additionally, the port is fairly deep to the skin surface. The normal length Huber needle could just barely penetrate the septum. Once the needle was securely through the septum, the port catheter was flushed and aspirated. A contrast injection was then performed using digital subtraction angiography. No evidence of thrombus, fibrin sheath, extravasation or other complication. IMPRESSION: 1. The location of the port reservoir is fairly deep to the skin surface and tilted slightly posterior at the caudal margin. Recommend use of the extra long noncoring needle when accessing. 2. Once accessed, the port was found to be functioning normally and the catheter tip is well positioned. Electronically Signed   By: Jacqulynn Cadet M.D.   On: 06/25/2018 16:00   Dg Chest Port 1 View  Result Date: 06/20/2018 CLINICAL DATA:  Near syncope EXAM: PORTABLE CHEST 1 VIEW COMPARISON:  06/13/2018, PET-CT 05/02/2018 FINDINGS: Left-sided central venous port tip over the mid right atrium. Cardiomegaly with small to moderate left pleural effusion and dense airspace disease at the left base. Decreased mediastinal adenopathy. IMPRESSION: 1. Cardiomegaly with small left pleural effusion and basilar airspace disease. 2. Decreased mediastinal adenopathy. Electronically Signed   By: Donavan Foil M.D.   On: 06/20/2018 17:34   Dg Chest Port 1 View  Result Date: 06/13/2018 CLINICAL DATA:  Port-A-Cath placement EXAM: PORTABLE CHEST 1 VIEW COMPARISON:  06/13/2018 FINDINGS: Left internal jugular Port-A-Cath has been placed. The tip is in the right atrium approximately 5 cm deep to the cavoatrial junction. There are low lung volumes. Cardiomegaly with vascular congestion. Left lower lobe atelectasis or infiltrate. No pneumothorax. No acute bony abnormality. IMPRESSION: Cardiomegaly, vascular congestion. Left lower lobe atelectasis or infiltrate. Low lung volumes.  Left Port-A-Cath tip in the right atrium approximately 5 cm deep to the cavoatrial junction. No pneumothorax. Electronically Signed   By: Rolm Baptise M.D.   On: 06/13/2018 16:29   Dg Fluoro Guide Cv Line-no Report  Result Date: 06/13/2018 Fluoroscopy was utilized by the requesting physician.  No radiographic interpretation.    ASSESSMENT AND PLAN: This is a very pleasant 58 years old African-American female with stage IV high-grade neuroendocrine carcinoma and currently undergoing systemic chemotherapy with carboplatin and etoposide status post 2 cycles. The patient has been tolerating this treatment well with no concerning complaints except for the fatigue and weakness secondary to chemotherapy-induced anemia and she was recently admitted to the hospital. I recommended for her to proceed with cycle #3 today as scheduled. I will arrange for the patient to come back for follow-up visit in 3 weeks for evaluation with repeat CT scan of the chest, abdomen and pelvis for restaging of her disease. She was advised to call immediately if she has any concerning symptoms in the interval. The patient voices understanding of current disease status and treatment options and is in agreement with the current care plan.  All questions were answered. The patient knows to call the clinic with any problems, questions or concerns. We can certainly see the patient much sooner if necessary.  I spent 10 minutes counseling the patient face  to face. The total time spent in the appointment was 15 minutes.  Disclaimer: This note was dictated with voice recognition software. Similar sounding words can inadvertently be transcribed and may not be corrected upon review.

## 2018-07-08 NOTE — Progress Notes (Signed)
Patient ID: Samantha Santos, female   DOB: Jun 12, 1960, 58 y.o.   MRN: [id number]      Samantha Santos, is a 58 y.o. female  VPX:[id number]  IOE:[id number]  DOB - 1961-01-21  Subjective:  Chief Complaint and HPI: Samantha Santos is a 58 y.o. female here today for a follow up visit After hospitalization 3/5-3/01/2019. She is feeling much better.  She was taken off of her BP meds due to hypotension from sepsis.  She has not resumed her BP meds.  She does have some BP meds at home.  She finished the Levaquin she was prescribed.  She has oncology appt today for infusion   Discharge Diagnoses:    SIRS (systemic inflammatory response syndrome)   Pneumonia   Leukopenia   Essential hypertension   CKD III   Lung cancer stage IV   Discharged Condition: good  Presentation Summary: Samantha Santos a 58 y.o.femalewithhistory of stage IV neuroendocrine tumor and hypertension who has undergone chemotherapy secondary to yesterday started of being weakness and lightheadedness when she was driving her grandson to school this morning. Later she reached home and felt very weak and lay on the bed. Denies any chest pain shortness of breath nausea vomiting or diarrhea. Had subjective feeling of fever and chills. Was feeling weak and late in the evening her friend persuaded to come to the ER and EMS was called.  ED Course:In the ER patient was hypotensive with mildly elevated lactate and significantly elevated leukocytosis. Patient was empirically started on antibiotics for possible developing sepsis chest x-ray shows possibility of infiltrates and pleural effusion. UA is pending. Patient was given fluid bolus for possible sepsis. Blood pressure improved with fluids.  Hospital Course:  # SIRS/ sepsis secondary to pneumonia -Patient admitted with hypotension, tachycardia, elevated white blood cell count, elevated lactic acid, LLL retrocardiac density on CXR - blood cultures, urine cultures  are negative to date, CT chest noncon showed consolidation vs atx a left base - pt responded well to IV abx and low grade fevers/ malaise resolved completely -WBC peaked at 43K (due to Neulasta) then dropped to 3K yest and 1.3K today; I spoke to Dr Julien Nordmann, Samantha Santos to dc home regardless of WBC, extend the levaquin x 5 more days to cover the WBC nadir - afebrile, changed to po levaquin yesterday, will get 6 more days (7d for PNA + 5d for WBC nadir)   ##Acute kidney injury mild - resolved w/ IVF's  ## IV access/ port - placed 2 wks ago by Dr Roxan Hockey. IV team unable to access, but looks like it was used last week Mon- Wed for chemo  - IR aware, for procedure today to make sure port is functional   ##Hypotension -Secondary to sepsis > resolved  ##Thrombocytopenia -Platelets are trended down from 231->124 -Most likely chemo induced, closely follow-up  ##Anemia chemo induced -Hemoglobin trended down from12 >>> 7.9. this is due to chemoRx, normal responses, have d/w pt's oncologist  -No signs of any bleeding  ##Gastroesophageal reflux disease -Continue the Protonix  ##Hypertension - holding home losartan and HCTZ. BP's low. These can be restarted at a later date when needed.   ##Neuroendocrine tumor stage IV -had 2nd round chemoRx Monday- Wed last week - per oncology     Antimicrobials:  Vancomycin 06/20/2018- 06/23/18 Cefepime 06/20/2018- 06/23/18 Levaquin 3/9 - dc   ED/Hospital notes reviewed.   Social History: Family history:  ROS:   Constitutional:  No f/c, No night sweats, No unexplained weight loss. EENT:  No  vision changes, No blurry vision, No hearing changes. No mouth, throat, or ear problems.  Respiratory: No cough,  SOB much improved Cardiac: No CP, no palpitations GI:  No abd pain, No N/V/D. GU: No Urinary s/sx Musculoskeletal: No joint pain Neuro: No headache, no dizziness, no motor weakness.  Skin: No rash Endocrine:  No polydipsia. No polyuria.   Psych: Denies SI/HI  No problems updated.  ALLERGIES: Allergies  Allergen Reactions  . Penicillins Other (See Comments)    Syncope Did it involve swelling of the face/tongue/throat, SOB, or low BP? No Did it involve sudden or severe rash/hives, skin peeling, or any reaction on the inside of your mouth or nose? No Did you need to seek medical attention at a hospital or doctor's office? Yes When did it last happen?over 20 years ago If all above answers are "NO", may proceed with cephalosporin use.   Marland Kitchen Oxycodone Other (See Comments)    hallucinations    PAST MEDICAL HISTORY: Past Medical History:  Diagnosis Date  . Anemia   . Arthritis   . GERD (gastroesophageal reflux disease)   . Hypertension   . Morbid obesity ([place]) 05/06/2018  . Supraclavicular adenopathy     MEDICATIONS AT HOME: Prior to Admission medications   Medication Sig Start Date End Date Taking? Authorizing Provider  lidocaine-prilocaine (EMLA) cream Apply 1 application topically as needed. Patient taking differently: Apply 1 application topically as needed (chemo port).  05/20/18   Curt Bears, MD  loratadine (CLARITIN) 10 MG tablet Take 10 mg by mouth daily.    [provider]  losartan-hydrochlorothiazide (HYZAAR) 100-12.5 MG tablet Take 1 tablet by mouth daily. 07/10/18   Argentina Donovan, PA-C  prochlorperazine (COMPAZINE) 10 MG tablet Take 1 tablet (10 mg total) by mouth every 6 (six) hours as needed for nausea or vomiting. 07/10/18   Argentina Donovan, PA-C  traMADol (ULTRAM) 50 MG tablet Take 1 tablet (50 mg total) by mouth every 6 (six) hours as needed. Patient not taking: Reported on 06/20/2018 06/13/18 06/13/19  [name], MD     Objective:  EXAM:   Vitals:   07/10/18 0956  BP: (!) 184/108  Pulse: 90  Resp: 16  Temp: 97.9 F (36.6 C)  TempSrc: Oral  SpO2: 99%  Weight: 288 lb 3.2 oz (130.7 kg)    General appearance : A&OX3. NAD. Non-toxic-appearing HEENT:  Atraumatic and Normocephalic.  PERRLA. EOM intact.   Chest/Lungs:  Breathing-non-labored, Good air entry bilaterally, breath sounds normal without rales, rhonchi, or wheezing  CVS: S1 S2 regular, no murmurs, gallops, rubs  Extremities: Bilateral Lower Ext shows no edema, both legs are warm to touch with = pulse throughout Neurology:  CN II-XII grossly intact, Non focal.   Psych:  TP linear. J/I WNL. Normal speech. Appropriate eye contact and affect.  Skin:  No Rash  Data Review No results found for: HGBA1C   Assessment & Plan   1. Essential hypertension Uncontrolled-resume BP meds.   - losartan-hydrochlorothiazide (HYZAAR) 100-12.5 MG tablet; Take 1 tablet by mouth daily.  Dispense: 90 tablet; Refill: 3 Check BP out of the office and record. Call or  Schedule appt if BP >135/85 within 2 weeks of resuming meds.  She was previously very well controlled on this dose.    2. SIRS (systemic inflammatory response syndrome) (HCC) Much improved  3. Hospital discharge follow-up Much improved.    Patient have been counseled extensively about nutrition and exercise  Return in about 3 months (around 10/10/2018)  for Dr Chapman Fitch htn follow-up.  The patient was given clear instructions to go to ER or return to medical center if symptoms don't improve, worsen or new problems develop. The patient verbalized understanding. The patient was told to call to get lab results if they haven't heard anything in the next week.     Freeman Caldron, PA-C [facility] [place], [place]   07/10/2018, 10:10 AM

## 2018-07-08 NOTE — Telephone Encounter (Signed)
Gave avs and calendar ° °

## 2018-07-08 NOTE — Patient Instructions (Signed)
[place] Discharge Instructions for Patients Receiving Chemotherapy  Today you received the following chemotherapy agents etopiside; carboplatin; vepesid  To help prevent nausea and vomiting after your treatment, we encourage you to take your nausea medication as directed If you develop nausea and vomiting that is not controlled by your nausea medication, call the clinic.   BELOW ARE SYMPTOMS THAT SHOULD BE REPORTED IMMEDIATELY:  *FEVER GREATER THAN 100.5 F  *CHILLS WITH OR WITHOUT FEVER  NAUSEA AND VOMITING THAT IS NOT CONTROLLED WITH YOUR NAUSEA MEDICATION  *UNUSUAL SHORTNESS OF BREATH  *UNUSUAL BRUISING OR BLEEDING  TENDERNESS IN MOUTH AND THROAT WITH OR WITHOUT PRESENCE OF ULCERS  *URINARY PROBLEMS  *BOWEL PROBLEMS  UNUSUAL RASH Items with * indicate a potential emergency and should be followed up as soon as possible.  Feel free to call the clinic should you have any questions or concerns. The clinic phone number is [phone].  Please show the [place] at check-in to the Emergency Department and triage nurse.  Atezolizumab injection What is this medicine? ATEZOLIZUMAB (a te zoe LIZ ue mab) is a monoclonal antibody. It is used to treat bladder cancer (urothelial cancer), non-small cell lung cancer, small cell lung cancer, and breast cancer. This medicine may be used for other purposes; ask your health care provider or pharmacist if you have questions. COMMON BRAND NAME(S): Tecentriq What should I tell my health care provider before I take this medicine? They need to know if you have any of these conditions: -diabetes -immune system problems -infection -inflammatory bowel disease -liver disease -lung or breathing disease -lupus -nervous system problems like myasthenia gravis or Guillain-Barre syndrome -organ transplant -an unusual or allergic reaction to atezolizumab, other medicines, foods, dyes, or preservatives -pregnant or trying  to get pregnant -breast-feeding How should I use this medicine? This medicine is for infusion into a vein. It is given by a health care professional in a hospital or clinic setting. A special MedGuide will be given to you before each treatment. Be sure to read this information carefully each time. Talk to your pediatrician regarding the use of this medicine in children. Special care may be needed. Overdosage: If you think you have taken too much of this medicine contact a poison control center or emergency room at once. NOTE: This medicine is only for you. Do not share this medicine with others. What if I miss a dose? It is important not to miss your dose. Call your doctor or health care professional if you are unable to keep an appointment. What may interact with this medicine? Interactions have not been studied. This list may not describe all possible interactions. Give your health care provider a list of all the medicines, herbs, non-prescription drugs, or dietary supplements you use. Also tell them if you smoke, drink alcohol, or use illegal drugs. Some items may interact with your medicine. What should I watch for while using this medicine? Your condition will be monitored carefully while you are receiving this medicine. You may need blood work done while you are taking this medicine. Do not become pregnant while taking this medicine or for at least 5 months after stopping it. Women should inform their doctor if they wish to become pregnant or think they might be pregnant. There is a potential for serious side effects to an unborn child. Talk to your health care professional or pharmacist for more information. Do not breast-feed an infant while taking this medicine or for at least 5 months  after the last dose. What side effects may I notice from receiving this medicine? Side effects that you should report to your doctor or health care professional as soon as possible: -allergic reactions like  skin rash, itching or hives, swelling of the face, lips, or tongue -black, tarry stools -bloody or watery diarrhea -breathing problems -changes in vision -chest pain or chest tightness -chills -facial flushing -fever -headache -signs and symptoms of high blood sugar such as dizziness; dry mouth; dry skin; fruity breath; nausea; stomach pain; increased hunger or thirst; increased urination -signs and symptoms of liver injury like dark yellow or brown urine; general ill feeling or flu-like symptoms; light-colored stools; loss of appetite; nausea; right upper belly pain; unusually weak or tired; yellowing of the eyes or skin -stomach pain -trouble passing urine or change in the amount of urine Side effects that usually do not require medical attention (report to your doctor or health care professional if they continue or are bothersome): -cough -diarrhea -joint pain -muscle pain -muscle weakness -tiredness -weight loss This list may not describe all possible side effects. Call your doctor for medical advice about side effects. You may report side effects to FDA at 1-800-FDA-1088. Where should I keep my medicine? This drug is given in a hospital or clinic and will not be stored at home. NOTE: This sheet is a summary. It may not cover all possible information. If you have questions about this medicine, talk to your doctor, pharmacist, or health care provider.  2019 Elsevier/Gold Standard (2017-07-06 09:33:38)   Carboplatin injection What is this medicine? CARBOPLATIN (KAR boe pla tin) is a chemotherapy drug. It targets fast dividing cells, like cancer cells, and causes these cells to die. This medicine is used to treat ovarian cancer and many other cancers. This medicine may be used for other purposes; ask your health care provider or pharmacist if you have questions. COMMON BRAND NAME(S): Paraplatin What should I tell my health care provider before I take this medicine? They need to  know if you have any of these conditions: -blood disorders -hearing problems -kidney disease -recent or ongoing radiation therapy -an unusual or allergic reaction to carboplatin, cisplatin, other chemotherapy, other medicines, foods, dyes, or preservatives -pregnant or trying to get pregnant -breast-feeding How should I use this medicine? This drug is usually given as an infusion into a vein. It is administered in a hospital or clinic by a specially trained health care professional. Talk to your pediatrician regarding the use of this medicine in children. Special care may be needed. Overdosage: If you think you have taken too much of this medicine contact a poison control center or emergency room at once. NOTE: This medicine is only for you. Do not share this medicine with others. What if I miss a dose? It is important not to miss a dose. Call your doctor or health care professional if you are unable to keep an appointment. What may interact with this medicine? -medicines for seizures -medicines to increase blood counts like filgrastim, pegfilgrastim, sargramostim -some antibiotics like amikacin, gentamicin, neomycin, streptomycin, tobramycin -vaccines Talk to your doctor or health care professional before taking any of these medicines: -acetaminophen -aspirin -ibuprofen -ketoprofen -naproxen This list may not describe all possible interactions. Give your health care provider a list of all the medicines, herbs, non-prescription drugs, or dietary supplements you use. Also tell them if you smoke, drink alcohol, or use illegal drugs. Some items may interact with your medicine. What should I watch for while using  this medicine? Your condition will be monitored carefully while you are receiving this medicine. You will need important blood work done while you are taking this medicine. This drug may make you feel generally unwell. This is not uncommon, as chemotherapy can affect healthy cells  as well as cancer cells. Report any side effects. Continue your course of treatment even though you feel ill unless your doctor tells you to stop. In some cases, you may be given additional medicines to help with side effects. Follow all directions for their use. Call your doctor or health care professional for advice if you get a fever, chills or sore throat, or other symptoms of a cold or flu. Do not treat yourself. This drug decreases your body's ability to fight infections. Try to avoid being around people who are sick. This medicine may increase your risk to bruise or bleed. Call your doctor or health care professional if you notice any unusual bleeding. Be careful brushing and flossing your teeth or using a toothpick because you may get an infection or bleed more easily. If you have any dental work done, tell your dentist you are receiving this medicine. Avoid taking products that contain aspirin, acetaminophen, ibuprofen, naproxen, or ketoprofen unless instructed by your doctor. These medicines may hide a fever. Do not become pregnant while taking this medicine. Women should inform their doctor if they wish to become pregnant or think they might be pregnant. There is a potential for serious side effects to an unborn child. Talk to your health care professional or pharmacist for more information. Do not breast-feed an infant while taking this medicine. What side effects may I notice from receiving this medicine? Side effects that you should report to your doctor or health care professional as soon as possible: -allergic reactions like skin rash, itching or hives, swelling of the face, lips, or tongue -signs of infection - fever or chills, cough, sore throat, pain or difficulty passing urine -signs of decreased platelets or bleeding - bruising, pinpoint red spots on the skin, black, tarry stools, nosebleeds -signs of decreased red blood cells - unusually weak or tired, fainting spells,  lightheadedness -breathing problems -changes in hearing -changes in vision -chest pain -high blood pressure -low blood counts - This drug may decrease the number of white blood cells, red blood cells and platelets. You may be at increased risk for infections and bleeding. -nausea and vomiting -pain, swelling, redness or irritation at the injection site -pain, tingling, numbness in the hands or feet -problems with balance, talking, walking -trouble passing urine or change in the amount of urine Side effects that usually do not require medical attention (report to your doctor or health care professional if they continue or are bothersome): -hair loss -loss of appetite -metallic taste in the mouth or changes in taste This list may not describe all possible side effects. Call your doctor for medical advice about side effects. You may report side effects to FDA at 1-800-FDA-1088. Where should I keep my medicine? This drug is given in a hospital or clinic and will not be stored at home. NOTE: This sheet is a summary. It may not cover all possible information. If you have questions about this medicine, talk to your doctor, pharmacist, or health care provider.  2019 Elsevier/Gold Standard (2007-07-09 14:38:05)   Etoposide, VP-16 injection What is this medicine? ETOPOSIDE, VP-16 (e toe POE side) is a chemotherapy drug. It is used to treat testicular cancer, lung cancer, and other cancers. This medicine may  be used for other purposes; ask your health care provider or pharmacist if you have questions. COMMON BRAND NAME(S): Etopophos, Toposar, VePesid What should I tell my health care provider before I take this medicine? They need to know if you have any of these conditions: -infection -kidney disease -liver disease -low blood counts, like low white cell, platelet, or red cell counts -an unusual or allergic reaction to etoposide, other medicines, foods, dyes, or preservatives -pregnant or  trying to get pregnant -breast-feeding How should I use this medicine? This medicine is for infusion into a vein. It is administered in a hospital or clinic by a specially trained health care professional. Talk to your pediatrician regarding the use of this medicine in children. Special care may be needed. Overdosage: If you think you have taken too much of this medicine contact a poison control center or emergency room at once. NOTE: This medicine is only for you. Do not share this medicine with others. What if I miss a dose? It is important not to miss your dose. Call your doctor or health care professional if you are unable to keep an appointment. What may interact with this medicine? -aspirin -certain medications for seizures like carbamazepine, phenobarbital, phenytoin, valproic acid -cyclosporine -levamisole -warfarin This list may not describe all possible interactions. Give your health care provider a list of all the medicines, herbs, non-prescription drugs, or dietary supplements you use. Also tell them if you smoke, drink alcohol, or use illegal drugs. Some items may interact with your medicine. What should I watch for while using this medicine? Visit your doctor for checks on your progress. This drug may make you feel generally unwell. This is not uncommon, as chemotherapy can affect healthy cells as well as cancer cells. Report any side effects. Continue your course of treatment even though you feel ill unless your doctor tells you to stop. In some cases, you may be given additional medicines to help with side effects. Follow all directions for their use. Call your doctor or health care professional for advice if you get a fever, chills or sore throat, or other symptoms of a cold or flu. Do not treat yourself. This drug decreases your body's ability to fight infections. Try to avoid being around people who are sick. This medicine may increase your risk to bruise or bleed. Call your  doctor or health care professional if you notice any unusual bleeding. Talk to your doctor about your risk of cancer. You may be more at risk for certain types of cancers if you take this medicine. Do not become pregnant while taking this medicine or for at least 6 months after stopping it. Women should inform their doctor if they wish to become pregnant or think they might be pregnant. Women of child-bearing potential will need to have a negative pregnancy test before starting this medicine. There is a potential for serious side effects to an unborn child. Talk to your health care professional or pharmacist for more information. Do not breast-feed an infant while taking this medicine. Men must use a latex condom during sexual contact with a woman while taking this medicine and for at least 4 months after stopping it. A latex condom is needed even if you have had a vasectomy. Contact your doctor right away if your partner becomes pregnant. Do not donate sperm while taking this medicine and for at least 4 months after you stop taking this medicine. Men should inform their doctors if they wish to father a child.  This medicine may lower sperm counts. What side effects may I notice from receiving this medicine? Side effects that you should report to your doctor or health care professional as soon as possible: -allergic reactions like skin rash, itching or hives, swelling of the face, lips, or tongue -low blood counts - this medicine may decrease the number of white blood cells, red blood cells and platelets. You may be at increased risk for infections and bleeding. -signs of infection - fever or chills, cough, sore throat, pain or difficulty passing urine -signs of decreased platelets or bleeding - bruising, pinpoint red spots on the skin, black, tarry stools, blood in the urine -signs of decreased red blood cells - unusually weak or tired, fainting spells, lightheadedness -breathing problems -changes in  vision -mouth or throat sores or ulcers -pain, redness, swelling or irritation at the injection site -pain, tingling, numbness in the hands or feet -redness, blistering, peeling or loosening of the skin, including inside the mouth -seizures -vomiting Side effects that usually do not require medical attention (report to your doctor or health care professional if they continue or are bothersome): -diarrhea -hair loss -loss of appetite -nausea -stomach pain This list may not describe all possible side effects. Call your doctor for medical advice about side effects. You may report side effects to FDA at 1-800-FDA-1088. Where should I keep my medicine? This drug is given in a hospital or clinic and will not be stored at home. NOTE: This sheet is a summary. It may not cover all possible information. If you have questions about this medicine, talk to your doctor, pharmacist, or health care provider.  2019 Elsevier/Gold Standard (2015-03-26 11:53:23)

## 2018-07-09 ENCOUNTER — Inpatient Hospital Stay: Payer: Medicaid Other

## 2018-07-09 ENCOUNTER — Other Ambulatory Visit: Payer: Self-pay

## 2018-07-09 VITALS — BP 140/82 | HR 99 | Temp 98.4°F | Resp 18

## 2018-07-09 DIAGNOSIS — Z5112 Encounter for antineoplastic immunotherapy: Secondary | ICD-10-CM | POA: Diagnosis not present

## 2018-07-09 DIAGNOSIS — C3412 Malignant neoplasm of upper lobe, left bronchus or lung: Secondary | ICD-10-CM

## 2018-07-09 MED ORDER — SODIUM CHLORIDE 0.9% FLUSH
10.0000 mL | INTRAVENOUS | Status: DC | PRN
  Administered 2018-07-09: 10 mL
  Filled 2018-07-09: qty 10

## 2018-07-09 MED ORDER — SODIUM CHLORIDE 0.9 % IV SOLN
100.0000 mg/m2 | Freq: Once | INTRAVENOUS | Status: AC
  Administered 2018-07-09: 250 mg via INTRAVENOUS
  Filled 2018-07-09: qty 12.5

## 2018-07-09 MED ORDER — DEXAMETHASONE SODIUM PHOSPHATE 10 MG/ML IJ SOLN
INTRAMUSCULAR | Status: AC
  Filled 2018-07-09: qty 1

## 2018-07-09 MED ORDER — HEPARIN SOD (PORK) LOCK FLUSH 100 UNIT/ML IV SOLN
500.0000 [IU] | Freq: Once | INTRAVENOUS | Status: AC | PRN
  Administered 2018-07-09: 500 [IU]
  Filled 2018-07-09: qty 5

## 2018-07-09 MED ORDER — SODIUM CHLORIDE 0.9 % IV SOLN
Freq: Once | INTRAVENOUS | Status: AC
  Administered 2018-07-09: 14:00:00 via INTRAVENOUS
  Filled 2018-07-09: qty 250

## 2018-07-09 MED ORDER — DEXAMETHASONE SODIUM PHOSPHATE 10 MG/ML IJ SOLN
10.0000 mg | Freq: Once | INTRAMUSCULAR | Status: AC
  Administered 2018-07-09: 10 mg via INTRAVENOUS

## 2018-07-09 NOTE — Patient Instructions (Addendum)
Coronavirus (COVID-19) Are you at risk?  Are you at risk for the Coronavirus (COVID-19)?  To be considered HIGH RISK for Coronavirus (COVID-19), you have to meet the following criteria:  . Traveled to [place], [place], [place], [place] or [place]; or in the [place] to [place], [place], [place], or [place]; and have fever, cough, and shortness of breath within the last 2 weeks of travel OR . Been in close contact with a person diagnosed with COVID-19 within the last 2 weeks and have fever, cough, and shortness of breath . IF YOU DO NOT MEET THESE CRITERIA, YOU ARE CONSIDERED LOW RISK FOR COVID-19.  What to do if you are HIGH RISK for COVID-19?  . If you are having a medical emergency, call 911. . Seek medical care right away. Before you go to a doctor's office, urgent care or emergency department, call ahead and tell them about your recent travel, contact with someone diagnosed with COVID-19, and your symptoms. You should receive instructions from your physician's office regarding next steps of care.  . When you arrive at healthcare provider, tell the healthcare staff immediately you have returned from visiting [place], [place], [place], [place] or [place]; or traveled in the [place] to [place], [place], [place], or [place]; in the last two weeks or you have been in close contact with a person diagnosed with COVID-19 in the last 2 weeks.   . Tell the health care staff about your symptoms: fever, cough and shortness of breath. . After you have been seen by a medical provider, you will be either: o Tested for (COVID-19) and discharged home on quarantine except to seek medical care if symptoms worsen, and asked to  - Stay home and avoid contact with others until you get your results (4-5 days)  - Avoid travel on public transportation if possible (such as bus, train, or airplane) or o Sent to the Emergency Department by EMS for evaluation, COVID-19 testing, and possible  admission depending on your condition and test results.  What to do if you are LOW RISK for COVID-19?  Reduce your risk of any infection by using the same precautions used for avoiding the common cold or flu:  . Wash your hands often with soap and warm water for at least 20 seconds.  If soap and water are not readily available, use an alcohol-based hand sanitizer with at least 60% alcohol.  . If coughing or sneezing, cover your mouth and nose by coughing or sneezing into the elbow areas of your shirt or coat, into a tissue or into your sleeve (not your hands). . Avoid shaking hands with others and consider head nods or verbal greetings only. . Avoid touching your eyes, nose, or mouth with unwashed hands.  . Avoid close contact with people who are sick. . Avoid places or events with large numbers of people in one location, like concerts or sporting events. . Carefully consider travel plans you have or are making. . If you are planning any travel outside or inside the [place], visit the CDC's Travelers' Health webpage for the latest health notices. . If you have some symptoms but not all symptoms, continue to monitor at home and seek medical attention if your symptoms worsen. . If you are having a medical emergency, call 911.   ADDITIONAL HEALTHCARE OPTIONS FOR PATIENTS  [facility] / e-Visit: [email or web address]         [facility]: [phone]  [place]   Urgent Care: [facility]: [place] Discharge Instructions for Patients Receiving Chemotherapy  Today you received the following chemotherapy agents Etoposide   To help prevent nausea and vomiting after your treatment, we encourage you to take your nausea medication as directed.    If you develop nausea and vomiting that is not controlled by your nausea medication, call the clinic.   BELOW ARE  SYMPTOMS THAT SHOULD BE REPORTED IMMEDIATELY:  *FEVER GREATER THAN 100.5 F  *CHILLS WITH OR WITHOUT FEVER  NAUSEA AND VOMITING THAT IS NOT CONTROLLED WITH YOUR NAUSEA MEDICATION  *UNUSUAL SHORTNESS OF BREATH  *UNUSUAL BRUISING OR BLEEDING  TENDERNESS IN MOUTH AND THROAT WITH OR WITHOUT PRESENCE OF ULCERS  *URINARY PROBLEMS  *BOWEL PROBLEMS  UNUSUAL RASH Items with * indicate a potential emergency and should be followed up as soon as possible.  Feel free to call the clinic should you have any questions or concerns. The clinic phone number is [phone].  Please show the [facility] at check-in to the Emergency Department and triage nurse.

## 2018-07-10 ENCOUNTER — Other Ambulatory Visit: Payer: Self-pay

## 2018-07-10 ENCOUNTER — Ambulatory Visit: Payer: Self-pay | Attending: Family Medicine | Admitting: Physician Assistant

## 2018-07-10 ENCOUNTER — Inpatient Hospital Stay: Payer: Medicaid Other

## 2018-07-10 VITALS — BP 146/94 | HR 91 | Temp 98.2°F | Resp 16

## 2018-07-10 VITALS — BP 184/108 | HR 90 | Temp 97.9°F | Resp 16 | Wt 288.2 lb

## 2018-07-10 DIAGNOSIS — I10 Essential (primary) hypertension: Secondary | ICD-10-CM | POA: Diagnosis not present

## 2018-07-10 DIAGNOSIS — Z09 Encounter for follow-up examination after completed treatment for conditions other than malignant neoplasm: Secondary | ICD-10-CM

## 2018-07-10 DIAGNOSIS — R6510 Systemic inflammatory response syndrome (SIRS) of non-infectious origin without acute organ dysfunction: Secondary | ICD-10-CM

## 2018-07-10 DIAGNOSIS — Z5112 Encounter for antineoplastic immunotherapy: Secondary | ICD-10-CM | POA: Diagnosis not present

## 2018-07-10 DIAGNOSIS — C3412 Malignant neoplasm of upper lobe, left bronchus or lung: Secondary | ICD-10-CM

## 2018-07-10 MED ORDER — SODIUM CHLORIDE 0.9 % IV SOLN
100.0000 mg/m2 | Freq: Once | INTRAVENOUS | Status: AC
  Administered 2018-07-10: 250 mg via INTRAVENOUS
  Filled 2018-07-10: qty 12.5

## 2018-07-10 MED ORDER — PEGFILGRASTIM 6 MG/0.6ML ~~LOC~~ PSKT
6.0000 mg | PREFILLED_SYRINGE | Freq: Once | SUBCUTANEOUS | Status: AC
  Administered 2018-07-10: 6 mg via SUBCUTANEOUS

## 2018-07-10 MED ORDER — PEGFILGRASTIM 6 MG/0.6ML ~~LOC~~ PSKT
PREFILLED_SYRINGE | SUBCUTANEOUS | Status: AC
  Filled 2018-07-10: qty 0.6

## 2018-07-10 MED ORDER — DEXAMETHASONE SODIUM PHOSPHATE 10 MG/ML IJ SOLN
INTRAMUSCULAR | Status: AC
  Filled 2018-07-10: qty 1

## 2018-07-10 MED ORDER — HEPARIN SOD (PORK) LOCK FLUSH 100 UNIT/ML IV SOLN
500.0000 [IU] | Freq: Once | INTRAVENOUS | Status: AC | PRN
  Administered 2018-07-10: 500 [IU]
  Filled 2018-07-10: qty 5

## 2018-07-10 MED ORDER — SODIUM CHLORIDE 0.9% FLUSH
10.0000 mL | INTRAVENOUS | Status: DC | PRN
  Administered 2018-07-10: 10 mL
  Filled 2018-07-10: qty 10

## 2018-07-10 MED ORDER — PROCHLORPERAZINE MALEATE 10 MG PO TABS: 10.0000 mg | ORAL_TABLET | Freq: Four times a day (QID) | ORAL | 3 refills | Status: AC | PRN

## 2018-07-10 MED ORDER — SODIUM CHLORIDE 0.9 % IV SOLN
Freq: Once | INTRAVENOUS | Status: AC
  Administered 2018-07-10: 14:00:00 via INTRAVENOUS
  Filled 2018-07-10: qty 250

## 2018-07-10 MED ORDER — DEXAMETHASONE SODIUM PHOSPHATE 10 MG/ML IJ SOLN
10.0000 mg | Freq: Once | INTRAMUSCULAR | Status: AC
  Administered 2018-07-10: 10 mg via INTRAVENOUS

## 2018-07-10 MED ORDER — LOSARTAN POTASSIUM-HCTZ 100-12.5 MG PO TABS: 1.0000 | ORAL_TABLET | Freq: Every day | ORAL | 3 refills | Status: DC

## 2018-07-10 MED FILL — PROCHLORPERAZINE 10 MG TAB: 10 | 12 days supply | Qty: 50 | Fill #0 | Status: TO

## 2018-07-10 MED FILL — LOSARTAN-HCTZ 100-12.5 MG T: 100-12.5 | 30 days supply | Qty: 30 | Fill #0 | Status: TO

## 2018-07-10 NOTE — Patient Instructions (Signed)
Coronavirus (COVID-19) Are you at risk?  Are you at risk for the Coronavirus (COVID-19)?  To be considered HIGH RISK for Coronavirus (COVID-19), you have to meet the following criteria:  . Traveled to [place], [place], [place], [place] or [place]; or in the [place] to [place], [place], [place], or [place]; and have fever, cough, and shortness of breath within the last 2 weeks of travel OR . Been in close contact with a person diagnosed with COVID-19 within the last 2 weeks and have fever, cough, and shortness of breath . IF YOU DO NOT MEET THESE CRITERIA, YOU ARE CONSIDERED LOW RISK FOR COVID-19.  What to do if you are HIGH RISK for COVID-19?  . If you are having a medical emergency, call 911. . Seek medical care right away. Before you go to a doctor's office, urgent care or emergency department, call ahead and tell them about your recent travel, contact with someone diagnosed with COVID-19, and your symptoms. You should receive instructions from your physician's office regarding next steps of care.  . When you arrive at healthcare provider, tell the healthcare staff immediately you have returned from visiting [place], [place], [place], [place] or [place]; or traveled in the [place] to [place], [place], [place], or [place]; in the last two weeks or you have been in close contact with a person diagnosed with COVID-19 in the last 2 weeks.   . Tell the health care staff about your symptoms: fever, cough and shortness of breath. . After you have been seen by a medical provider, you will be either: o Tested for (COVID-19) and discharged home on quarantine except to seek medical care if symptoms worsen, and asked to  - Stay home and avoid contact with others until you get your results (4-5 days)  - Avoid travel on public transportation if possible (such as bus, train, or airplane) or o Sent to the Emergency Department by EMS for evaluation, COVID-19 testing, and possible  admission depending on your condition and test results.  What to do if you are LOW RISK for COVID-19?  Reduce your risk of any infection by using the same precautions used for avoiding the common cold or flu:  . Wash your hands often with soap and warm water for at least 20 seconds.  If soap and water are not readily available, use an alcohol-based hand sanitizer with at least 60% alcohol.  . If coughing or sneezing, cover your mouth and nose by coughing or sneezing into the elbow areas of your shirt or coat, into a tissue or into your sleeve (not your hands). . Avoid shaking hands with others and consider head nods or verbal greetings only. . Avoid touching your eyes, nose, or mouth with unwashed hands.  . Avoid close contact with people who are sick. . Avoid places or events with large numbers of people in one location, like concerts or sporting events. . Carefully consider travel plans you have or are making. . If you are planning any travel outside or inside the [place], visit the CDC's Travelers' Health webpage for the latest health notices. . If you have some symptoms but not all symptoms, continue to monitor at home and seek medical attention if your symptoms worsen. . If you are having a medical emergency, call 911.   ADDITIONAL HEALTHCARE OPTIONS FOR PATIENTS  [facility] / e-Visit: [email or web address]         [facility]: [phone]  [facility]   Urgent Care: [facility]: [place] Discharge Instructions for Patients Receiving Chemotherapy  Today you received the following chemotherapy agents Etoposide   To help prevent nausea and vomiting after your treatment, we encourage you to take your nausea medication as directed.    If you develop nausea and vomiting that is not controlled by your nausea medication, call the clinic.   BELOW ARE  SYMPTOMS THAT SHOULD BE REPORTED IMMEDIATELY:  *FEVER GREATER THAN 100.5 F  *CHILLS WITH OR WITHOUT FEVER  NAUSEA AND VOMITING THAT IS NOT CONTROLLED WITH YOUR NAUSEA MEDICATION  *UNUSUAL SHORTNESS OF BREATH  *UNUSUAL BRUISING OR BLEEDING  TENDERNESS IN MOUTH AND THROAT WITH OR WITHOUT PRESENCE OF ULCERS  *URINARY PROBLEMS  *BOWEL PROBLEMS  UNUSUAL RASH Items with * indicate a potential emergency and should be followed up as soon as possible.  Feel free to call the clinic should you have any questions or concerns. The clinic phone number is [phone].  Please show the [facility] at check-in to the Emergency Department and triage nurse.

## 2018-07-12 ENCOUNTER — Telehealth: Payer: Self-pay | Admitting: Emergency Medicine

## 2018-07-12 NOTE — Telephone Encounter (Signed)
Called pt per MD Burr Medico request after pt's reported syncopal episode last night (07/11/2018) and to f/u on neulasta problem.  Spoke with pt's daughter Rosario Adie who reports pt is sleeping at this time.  Syncopal episode reported while pt was using the restroom/having a bowel movement.  Pt slumped forward and was caught by daughter who laid her on the floor.  No head injury or other injuries reported.  Daughter reports pt was 'in and out' for about a minute but breathing normally, didn't remember episode afterwards.  Afterwards per daughter pt was A&Ox4 with no further incidents or complaints, appeared stable and continues to report feeling fine.  Pt was able to eat and drink afterwards.  Neulasta Onpro kit per triage line nurse instructions was removed by daughter without issue d/t "the needle was stuck in the skin and the light didn't change" and retained for next visit to [facility], daughter denies any current concerns or issues with Onpro at this time.  Daughter states she feels pt is okay at this time without further intervention, VU of instructions to continue pushing food and fluids and to call back with any further concerns or questions or repeated episodes.

## 2018-07-15 ENCOUNTER — Inpatient Hospital Stay: Payer: Medicaid Other

## 2018-07-15 ENCOUNTER — Other Ambulatory Visit: Payer: Self-pay

## 2018-07-15 DIAGNOSIS — R5382 Chronic fatigue, unspecified: Secondary | ICD-10-CM

## 2018-07-15 DIAGNOSIS — Z5112 Encounter for antineoplastic immunotherapy: Secondary | ICD-10-CM | POA: Diagnosis not present

## 2018-07-15 DIAGNOSIS — C3412 Malignant neoplasm of upper lobe, left bronchus or lung: Secondary | ICD-10-CM

## 2018-07-15 LAB — CBC WITH DIFFERENTIAL (CANCER CENTER ONLY)
Abs Immature Granulocytes: 0.48 10*3/uL — ABNORMAL HIGH (ref 0.00–0.07)
Basophils Absolute: 0 10*3/uL (ref 0.0–0.1)
Basophils Relative: 1 %
Eosinophils Absolute: 0 10*3/uL (ref 0.0–0.5)
Eosinophils Relative: 0 %
HCT: 29.8 % — ABNORMAL LOW (ref 36.0–46.0)
Hemoglobin: 9.6 g/dL — ABNORMAL LOW (ref 12.0–15.0)
Immature Granulocytes: 8 %
Lymphocytes Relative: 21 %
Lymphs Abs: 1.2 10*3/uL (ref 0.7–4.0)
MCH: 27.5 pg (ref 26.0–34.0)
MCHC: 32.2 g/dL (ref 30.0–36.0)
MCV: 85.4 fL (ref 80.0–100.0)
Monocytes Absolute: 0.1 10*3/uL (ref 0.1–1.0)
Monocytes Relative: 2 %
Neutro Abs: 4 10*3/uL (ref 1.7–7.7)
Neutrophils Relative %: 68 %
Platelet Count: 83 10*3/uL — ABNORMAL LOW (ref 150–400)
RBC: 3.49 MIL/uL — ABNORMAL LOW (ref 3.87–5.11)
RDW: 22.5 % — ABNORMAL HIGH (ref 11.5–15.5)
WBC Count: 5.9 10*3/uL (ref 4.0–10.5)
nRBC: 0 % (ref 0.0–0.2)

## 2018-07-15 LAB — CMP (CANCER CENTER ONLY)
ALT: 24 U/L (ref 0–44)
ANION GAP: 10 (ref 5–15)
AST: 8 U/L — ABNORMAL LOW (ref 15–41)
Albumin: 3.5 g/dL (ref 3.5–5.0)
Alkaline Phosphatase: 119 U/L (ref 38–126)
BILIRUBIN TOTAL: 1.5 mg/dL — AB (ref 0.3–1.2)
BUN: 19 mg/dL (ref 6–20)
CO2: 27 mmol/L (ref 22–32)
Calcium: 8.7 mg/dL — ABNORMAL LOW (ref 8.9–10.3)
Chloride: 102 mmol/L (ref 98–111)
Creatinine: 0.94 mg/dL (ref 0.44–1.00)
GFR, Est AFR Am: >60 mL/min (ref >60)
GFR, Estimated: >60 mL/min (ref >60)
Glucose, Bld: 126 mg/dL — ABNORMAL HIGH (ref 70–99)
Potassium: 3.9 mmol/L (ref 3.5–5.1)
Sodium: 139 mmol/L (ref 135–145)
TOTAL PROTEIN: 7.3 g/dL (ref 6.5–8.1)

## 2018-07-22 ENCOUNTER — Other Ambulatory Visit: Payer: Self-pay

## 2018-07-22 ENCOUNTER — Inpatient Hospital Stay: Payer: Medicaid Other

## 2018-07-22 ENCOUNTER — Inpatient Hospital Stay: Payer: Medicaid Other | Attending: Internal Medicine

## 2018-07-22 DIAGNOSIS — Z5111 Encounter for antineoplastic chemotherapy: Secondary | ICD-10-CM | POA: Insufficient documentation

## 2018-07-22 DIAGNOSIS — Z7689 Persons encountering health services in other specified circumstances: Secondary | ICD-10-CM | POA: Insufficient documentation

## 2018-07-22 DIAGNOSIS — I10 Essential (primary) hypertension: Secondary | ICD-10-CM | POA: Diagnosis not present

## 2018-07-22 DIAGNOSIS — C3412 Malignant neoplasm of upper lobe, left bronchus or lung: Secondary | ICD-10-CM

## 2018-07-22 DIAGNOSIS — Z79899 Other long term (current) drug therapy: Secondary | ICD-10-CM | POA: Diagnosis not present

## 2018-07-22 DIAGNOSIS — C7A1 Malignant poorly differentiated neuroendocrine tumors: Secondary | ICD-10-CM | POA: Insufficient documentation

## 2018-07-22 LAB — CBC WITH DIFFERENTIAL (CANCER CENTER ONLY)
Abs Immature Granulocytes: 0 10*3/uL (ref 0.00–0.07)
Band Neutrophils: 12 %
Basophils Absolute: 0.1 10*3/uL (ref 0.0–0.1)
Basophils Relative: 1 %
Eosinophils Absolute: 0 10*3/uL (ref 0.0–0.5)
Eosinophils Relative: 0 %
HCT: 27.5 % — ABNORMAL LOW (ref 36.0–46.0)
Hemoglobin: 8.7 g/dL — ABNORMAL LOW (ref 12.0–15.0)
Lymphocytes Relative: 45 %
Lymphs Abs: 2.5 10*3/uL (ref 0.7–4.0)
MCH: 28.2 pg (ref 26.0–34.0)
MCHC: 31.6 g/dL (ref 30.0–36.0)
MCV: 89 fL (ref 80.0–100.0)
Monocytes Absolute: 0.4 10*3/uL (ref 0.1–1.0)
Monocytes Relative: 8 %
Neutro Abs: 2.6 10*3/uL (ref 1.7–17.7)
Neutrophils Relative %: 34 %
Platelet Count: 55 10*3/uL — ABNORMAL LOW (ref 150–400)
RBC: 3.09 MIL/uL — ABNORMAL LOW (ref 3.87–5.11)
RDW: 24.3 % — ABNORMAL HIGH (ref 11.5–15.5)
WBC Count: 5.6 10*3/uL (ref 4.0–10.5)
nRBC: 2 % — ABNORMAL HIGH (ref 0.0–0.2)
nRBC: 2 /100 WBC — ABNORMAL HIGH

## 2018-07-22 LAB — CMP (CANCER CENTER ONLY)
ALT: 24 U/L (ref 0–44)
AST: 11 U/L — ABNORMAL LOW (ref 15–41)
Albumin: 3.3 g/dL — ABNORMAL LOW (ref 3.5–5.0)
Alkaline Phosphatase: 106 U/L (ref 38–126)
Anion gap: 8 (ref 5–15)
BUN: 15 mg/dL (ref 6–20)
CO2: 27 mmol/L (ref 22–32)
Calcium: 8.3 mg/dL — ABNORMAL LOW (ref 8.9–10.3)
Chloride: 106 mmol/L (ref 98–111)
Creatinine: 1.03 mg/dL — ABNORMAL HIGH (ref 0.44–1.00)
GFR, Est AFR Am: >60 mL/min (ref >60)
GFR, Estimated: >60 mL/min (ref >60)
Glucose, Bld: 90 mg/dL (ref 70–99)
Potassium: 3.7 mmol/L (ref 3.5–5.1)
Sodium: 141 mmol/L (ref 135–145)
Total Bilirubin: 0.6 mg/dL (ref 0.3–1.2)
Total Protein: 6.9 g/dL (ref 6.5–8.1)

## 2018-07-26 ENCOUNTER — Ambulatory Visit (HOSPITAL_COMMUNITY)
Admission: RE | Admit: 2018-07-26 | Discharge: 2018-07-26 | Disposition: A | Discharge status: Home / Self Care | Payer: Medicaid Other | Source: Ambulatory Visit | Attending: Physician Assistant | Admitting: Physician Assistant

## 2018-07-26 ENCOUNTER — Other Ambulatory Visit: Payer: Self-pay

## 2018-07-26 DIAGNOSIS — C3412 Malignant neoplasm of upper lobe, left bronchus or lung: Secondary | ICD-10-CM | POA: Insufficient documentation

## 2018-07-26 MED ORDER — SODIUM CHLORIDE (PF) 0.9 % IJ SOLN
INTRAMUSCULAR | Status: AC
  Filled 2018-07-26: qty 50

## 2018-07-26 MED ORDER — IOHEXOL 300 MG/ML  SOLN
100.0000 mL | Freq: Once | INTRAMUSCULAR | Status: AC | PRN
  Administered 2018-07-26: 14:00:00 100 mL via INTRAVENOUS

## 2018-07-29 ENCOUNTER — Encounter: Payer: Self-pay | Admitting: Physician Assistant

## 2018-07-29 ENCOUNTER — Inpatient Hospital Stay (HOSPITAL_BASED_OUTPATIENT_CLINIC_OR_DEPARTMENT_OTHER): Payer: Medicaid Other | Admitting: Physician Assistant

## 2018-07-29 ENCOUNTER — Other Ambulatory Visit: Payer: Self-pay

## 2018-07-29 ENCOUNTER — Inpatient Hospital Stay: Payer: Medicaid Other

## 2018-07-29 VITALS — BP 142/122 | HR 102 | Temp 98.0°F | Resp 18 | Ht 67.0 in | Wt 282.7 lb

## 2018-07-29 VITALS — BP 119/95 | HR 98

## 2018-07-29 DIAGNOSIS — I820 Budd-Chiari syndrome: Secondary | ICD-10-CM

## 2018-07-29 DIAGNOSIS — C7A1 Malignant poorly differentiated neuroendocrine tumors: Secondary | ICD-10-CM

## 2018-07-29 DIAGNOSIS — C3412 Malignant neoplasm of upper lobe, left bronchus or lung: Secondary | ICD-10-CM

## 2018-07-29 DIAGNOSIS — Z5111 Encounter for antineoplastic chemotherapy: Secondary | ICD-10-CM

## 2018-07-29 DIAGNOSIS — I10 Essential (primary) hypertension: Secondary | ICD-10-CM

## 2018-07-29 DIAGNOSIS — Z5112 Encounter for antineoplastic immunotherapy: Secondary | ICD-10-CM

## 2018-07-29 DIAGNOSIS — Z95828 Presence of other vascular implants and grafts: Secondary | ICD-10-CM

## 2018-07-29 DIAGNOSIS — Z79899 Other long term (current) drug therapy: Secondary | ICD-10-CM | POA: Diagnosis not present

## 2018-07-29 DIAGNOSIS — R5382 Chronic fatigue, unspecified: Secondary | ICD-10-CM

## 2018-07-29 LAB — CBC WITH DIFFERENTIAL (CANCER CENTER ONLY)
Abs Immature Granulocytes: 0.01 10*3/uL (ref 0.00–0.07)
Basophils Absolute: 0 10*3/uL (ref 0.0–0.1)
Basophils Relative: 0 %
Eosinophils Absolute: 0 10*3/uL (ref 0.0–0.5)
Eosinophils Relative: 0 %
HCT: 31.5 % — ABNORMAL LOW (ref 36.0–46.0)
Hemoglobin: 9.8 g/dL — ABNORMAL LOW (ref 12.0–15.0)
Immature Granulocytes: 0 %
Lymphocytes Relative: 35 %
Lymphs Abs: 1.3 10*3/uL (ref 0.7–4.0)
MCH: 27.9 pg (ref 26.0–34.0)
MCHC: 31.1 g/dL (ref 30.0–36.0)
MCV: 89.7 fL (ref 80.0–100.0)
Monocytes Absolute: 0.5 10*3/uL (ref 0.1–1.0)
Monocytes Relative: 13 %
Neutro Abs: 2 10*3/uL (ref 1.7–7.7)
Neutrophils Relative %: 52 %
Platelet Count: 163 10*3/uL (ref 150–400)
RBC: 3.51 MIL/uL — ABNORMAL LOW (ref 3.87–5.11)
RDW: 24.6 % — ABNORMAL HIGH (ref 11.5–15.5)
WBC Count: 3.9 10*3/uL — ABNORMAL LOW (ref 4.0–10.5)
nRBC: 0.5 % — ABNORMAL HIGH (ref 0.0–0.2)

## 2018-07-29 LAB — CMP (CANCER CENTER ONLY)
ALT: 18 U/L (ref 0–44)
AST: 13 U/L — ABNORMAL LOW (ref 15–41)
Albumin: 3.6 g/dL (ref 3.5–5.0)
Alkaline Phosphatase: 111 U/L (ref 38–126)
Anion gap: 9 (ref 5–15)
BUN: 12 mg/dL (ref 6–20)
CO2: 25 mmol/L (ref 22–32)
Calcium: 9.1 mg/dL (ref 8.9–10.3)
Chloride: 106 mmol/L (ref 98–111)
Creatinine: 0.96 mg/dL (ref 0.44–1.00)
GFR, Est AFR Am: >60 mL/min (ref >60)
GFR, Estimated: >60 mL/min (ref >60)
Glucose, Bld: 101 mg/dL — ABNORMAL HIGH (ref 70–99)
Potassium: 4 mmol/L (ref 3.5–5.1)
Sodium: 140 mmol/L (ref 135–145)
Total Bilirubin: 0.7 mg/dL (ref 0.3–1.2)
Total Protein: 7.6 g/dL (ref 6.5–8.1)

## 2018-07-29 LAB — TSH: TSH: 1.289 u[IU]/mL (ref 0.308–3.960)

## 2018-07-29 MED ORDER — HEPARIN SOD (PORK) LOCK FLUSH 100 UNIT/ML IV SOLN
500.0000 [IU] | Freq: Once | INTRAVENOUS | Status: AC | PRN
  Administered 2018-07-29: 16:00:00 500 [IU]
  Filled 2018-07-29: qty 5

## 2018-07-29 MED ORDER — RIVAROXABAN (XARELTO) VTE STARTER PACK (15 & 20 MG): ORAL_TABLET | ORAL | 0 refills | Status: DC

## 2018-07-29 MED ORDER — SODIUM CHLORIDE 0.9% FLUSH
10.0000 mL | INTRAVENOUS | Status: DC | PRN
  Administered 2018-07-29: 10 mL
  Filled 2018-07-29: qty 10

## 2018-07-29 MED ORDER — SODIUM CHLORIDE 0.9 % IV SOLN
Freq: Once | INTRAVENOUS | Status: AC
  Administered 2018-07-29: 13:00:00 via INTRAVENOUS
  Filled 2018-07-29: qty 250

## 2018-07-29 MED ORDER — SODIUM CHLORIDE 0.9 % IV SOLN
Freq: Once | INTRAVENOUS | Status: AC
  Administered 2018-07-29: 13:00:00 via INTRAVENOUS
  Filled 2018-07-29: qty 5

## 2018-07-29 MED ORDER — PALONOSETRON HCL INJECTION 0.25 MG/5ML
0.2500 mg | Freq: Once | INTRAVENOUS | Status: AC
  Administered 2018-07-29: 13:00:00 0.25 mg via INTRAVENOUS

## 2018-07-29 MED ORDER — SODIUM CHLORIDE 0.9 % IV SOLN
1200.0000 mg | Freq: Once | INTRAVENOUS | Status: AC
  Administered 2018-07-29: 1200 mg via INTRAVENOUS
  Filled 2018-07-29: qty 20

## 2018-07-29 MED ORDER — PALONOSETRON HCL INJECTION 0.25 MG/5ML
INTRAVENOUS | Status: AC
  Filled 2018-07-29: qty 5

## 2018-07-29 MED ORDER — SODIUM CHLORIDE 0.9 % IV SOLN
100.0000 mg/m2 | Freq: Once | INTRAVENOUS | Status: AC
  Administered 2018-07-29: 250 mg via INTRAVENOUS
  Filled 2018-07-29: qty 12.5

## 2018-07-29 MED ORDER — SODIUM CHLORIDE 0.9 % IV SOLN
750.0000 mg | Freq: Once | INTRAVENOUS | Status: AC
  Administered 2018-07-29: 750 mg via INTRAVENOUS
  Filled 2018-07-29: qty 75

## 2018-07-29 NOTE — Progress Notes (Signed)
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  PRIOR THERAPY: None  CURRENT THERAPY: Carboplatin for AUC of 5 on day 1, etoposide 100 mg/M2 on days 1, 2 and 3 with Neulasta support in addition to [place].First dose February 10th, 2020. Status post 3 cycles.   INTERVAL HISTORY: Samantha Santos 58 y.o. female returns to the clinic for a follow-up visit.  The patient is feeling well today.  She had an unwitnessed syncopal episode approximately 2 weeks ago while having a bowel movement. Her daughter found slumped over and helped lay her on the floor. She did not injure herself or hit her head. She reported associated nausea and diaphoresis prior to her syncopal episode but otherwise has no recollection of the event. Since that time, she denies any more syncopal episodes. She is drinking and eating as per usual. She has lost approximately 7lbs but states this is attributed to eating healthier.   The patient also expresses concern for her Onpro kit. She does not believe it is administering the medication because the "light does not change".    Otherwise she denies any other concerning symptoms.  She denies any fever, chills, or night sweats.  She denies any chest pain, shortness of breath, cough, or hemoptysis.  She denies any nausea, vomiting, diarrhea, or constipation.  She denies any headache, visual changes, gait changes, or known seizure activity. She denies any rashes or skin changes.  She recently had a restaging CT scan and is here for evaluation and to discuss her scan results prior to proceeding with cycle #4 today.   MEDICAL HISTORY: Past Medical History:  Diagnosis Date  . Anemia   . Arthritis   . GERD (gastroesophageal reflux disease)   . Hypertension   . Morbid obesity ([place])  05/06/2018  . Supraclavicular adenopathy     ALLERGIES:  is allergic to penicillins and oxycodone.  MEDICATIONS:  Current Outpatient Medications  Medication Sig Dispense Refill  . lidocaine-prilocaine (EMLA) cream Apply 1 application topically as needed. (Patient taking differently: Apply 1 application topically as needed (chemo port). ) 30 g 0  . loratadine (CLARITIN) 10 MG tablet Take 10 mg by mouth daily.    Marland Kitchen losartan-hydrochlorothiazide (HYZAAR) 100-12.5 MG tablet Take 1 tablet by mouth daily. 90 tablet 3  . prochlorperazine (COMPAZINE) 10 MG tablet Take 1 tablet (10 mg total) by mouth every 6 (six) hours as needed for nausea or vomiting. 50 tablet 3  . traMADol (ULTRAM) 50 MG tablet Take 1 tablet (50 mg total) by mouth every 6 (six) hours as needed. 20 tablet 0  . Rivaroxaban 15 & 20 MG TBPK Take as directed on package: Start with one 58m tablet by mouth twice a day with food. On Day 22, switch to one 240mtablet once a day with food. 51 each 0   No current facility-administered medications for this visit.    Facility-Administered Medications Ordered in Other Visits  Medication Dose Route Frequency Provider Last Rate Last Dose  . atezolizumab (TECENTRIQ) 1,200 mg in sodium chloride 0.9 % 250 mL chemo infusion  1,200 mg Intravenous Once MoCurt BearsMD      . CARBOplatin (PARAPLATIN) 750 mg in sodium chloride 0.9 % 250 mL chemo infusion  750 mg Intravenous Once MoCurt BearsMD      . etoposide (  VEPESID) 250 mg in sodium chloride 0.9 % 650 mL chemo infusion  100 mg/m2 (Treatment Plan Recorded) Intravenous Once Curt Bears, MD      . fosaprepitant (EMEND) 150 mg, dexamethasone (DECADRON) 12 mg in sodium chloride 0.9 % 145 mL IVPB   Intravenous Once Curt Bears, MD      . heparin lock flush 100 unit/mL  500 Units Intracatheter Once PRN Curt Bears, MD      . palonosetron (ALOXI) injection 0.25 mg  0.25 mg Intravenous Once Curt Bears, MD      . sodium  chloride flush (NS) 0.9 % injection 10 mL  10 mL Intracatheter PRN Curt Bears, MD        SURGICAL HISTORY:  Past Surgical History:  Procedure Laterality Date  . CESAREAN SECTION  1988  . HYSTEROSCOPY W/D&C  06/14/2011   Procedure: DILATATION AND CURETTAGE /HYSTEROSCOPY;  Surgeon: Frederico Hamman, MD;  Location: [facility];  Service: Gynecology;  Laterality: N/A;  . IR CV LINE INJECTION  06/25/2018  . PORTACATH PLACEMENT Left 06/13/2018   Procedure: INSERTION PORT-A-CATH;  Surgeon: [name], MD;  Location: [facility];  Service: Thoracic;  Laterality: Left;  . SUPRACLAVICAL NODE BIOPSY Right 05/10/2018   Procedure: SUPRACLAVICAL NODE BIOPSY;  Surgeon: [name], MD;  Location: [facility];  Service: Thoracic;  Laterality: Right;  . TUBAL LIGATION      REVIEW OF SYSTEMS:   Review of Systems  Constitutional: Negative for appetite change, chills, fatigue, fever and unexpected weight change.  HENT: Negative for mouth sores, nosebleeds, sore throat and trouble swallowing.   Eyes: Negative for eye problems and icterus.  Respiratory: Negative for cough, hemoptysis, shortness of breath and wheezing.   Cardiovascular: Negative for chest pain and leg swelling.  Gastrointestinal: Negative for abdominal pain, constipation, diarrhea, nausea and vomiting.  Genitourinary: Negative for bladder incontinence, difficulty urinating, dysuria, frequency and hematuria.   Musculoskeletal: Negative for back pain, gait problem, neck pain and neck stiffness.  Skin: Negative for itching and rash.  Neurological: Positive for syncope. Negative for dizziness, extremity weakness, gait problem, headaches, light-headedness and seizures.  Hematological: Negative for adenopathy. Does not bruise/bleed easily.  Psychiatric/Behavioral: Negative for confusion, depression and sleep disturbance. The patient is not nervous/anxious.     PHYSICAL EXAMINATION:  Blood pressure (!) 142/122, pulse (!) 102, temperature  98 F (36.7 C), temperature source Oral, resp. rate 18, height _0  (1.702 m), weight 282 lb 11.2 oz (128.2 kg), SpO2 100 %.  ECOG PERFORMANCE STATUS: 1 - Symptomatic but completely ambulatory  Physical Exam  Constitutional: Oriented to person, place, and time and well-developed, well-nourished, and in no distress. No distress.  HENT:  Head: Normocephalic and atraumatic.  Mouth/Throat: Oropharynx is clear and moist. No oropharyngeal exudate.  Eyes: Conjunctivae are normal. Right eye exhibits no discharge. Left eye exhibits no discharge. No scleral icterus.  Neck: Normal range of motion. Neck supple.  Cardiovascular: Normal rate, regular rhythm, normal heart sounds and intact distal pulses.   Pulmonary/Chest: Diminished breath sounds at the lower left lung base. Effort normal and breath sounds normal in all other lung fields. No respiratory distress. No wheezes. No rales.  Abdominal: Soft. Bowel sounds are normal. Exhibits no distension and no mass. There is no tenderness.  Musculoskeletal: Normal range of motion. Exhibits no edema.  Lymphadenopathy:    No cervical adenopathy.  Neurological: Alert and oriented to person, place, and time. Exhibits normal muscle tone. Gait normal. Coordination normal.  Skin: Skin is warm  and dry. No rash noted. Not diaphoretic. No erythema. No pallor.  Psychiatric: Mood, memory and judgment normal.  Vitals reviewed.  LABORATORY DATA: Lab Results  Component Value Date   WBC 3.9 (L) 07/29/2018   HGB 9.8 (L) 07/29/2018   HCT 31.5 (L) 07/29/2018   MCV 89.7 07/29/2018   PLT 163 07/29/2018      Chemistry      Component Value Date/Time   NA 140 07/29/2018 1133   NA 144 04/08/2018 1145   K 4.0 07/29/2018 1133   CL 106 07/29/2018 1133   CO2 25 07/29/2018 1133   BUN 12 07/29/2018 1133   BUN 10 04/08/2018 1145   CREATININE 0.96 07/29/2018 1133      Component Value Date/Time   CALCIUM 9.1 07/29/2018 1133   ALKPHOS 111 07/29/2018 1133   AST 13 (L)  07/29/2018 1133   ALT 18 07/29/2018 1133   BILITOT 0.7 07/29/2018 1133       RADIOGRAPHIC STUDIES:  Ct Chest W Contrast  Result Date: 07/28/2018 CLINICAL DATA:  Stage IV high-grade neuroendocrine left lung cancer diagnosed in January 2020. Chemotherapy in progress. EXAM: CT CHEST, ABDOMEN, AND PELVIS WITH CONTRAST TECHNIQUE: Multidetector CT imaging of the chest, abdomen and pelvis was performed following the standard protocol during bolus administration of intravenous contrast. CONTRAST:  126m OMNIPAQUE IOHEXOL 300 MG/ML  SOLN COMPARISON:  Chest CT 06/24/2018 and 04/06/2018.  PET-CT 05/02/2018. FINDINGS: CT CHEST FINDINGS Cardiovascular: Left IJ Port-A-Cath tip extends into the lower right atrium, near the tricuspid valve, but is unchanged. There are no acute vascular findings. A small pericardial effusion is stable. The heart size is normal. Mediastinum/Nodes: Further improvement in mediastinal adenopathy with the remaining nodes largely in the pre-vascular space. There is a component measuring up to 4.2 x 2.5 cm on image 18/2. There is no significant hilar adenopathy. Supraclavicular adenopathy has also improved with a 1.6 cm right supraclavicular node on image 4/2, incompletely visualized. No progressive adenopathy identified. The thyroid gland, trachea and esophagus demonstrate no significant findings. Lungs/Pleura: Stable trace left pleural effusion and adjacent left lower lobe airspace disease with air bronchograms. No new or enlarging pulmonary nodules. Musculoskeletal/Chest wall: Progressive sclerosis of the sternal manubrium at site of previously demonstrated hypermetabolic lytic metastases. No new lesions or acute osseous findings are identified. There is a healed fracture of the left 7th rib laterally. CT ABDOMEN AND PELVIS FINDINGS Hepatobiliary: No focal hepatic lesion or abnormal enhancement identified. However, there is linear low density in the left hepatic lobe, likely due to thrombus  in the left hepatic vein (images 40 through 43 of series 2). The additional hepatic veins, portal vein and IVC appear patent. No evidence of gallstones, gallbladder wall thickening or biliary dilatation. Pancreas: Unremarkable. No pancreatic ductal dilatation or surrounding inflammatory changes. Spleen: 6 mm low-density lesion superiorly in the spleen on image 31/2 is not clearly seen on previous studies, although is of questionable significance. The spleen is normal in size. Adrenals/Urinary Tract: Both adrenal glands appear normal. There are small bilateral renal cysts. No evidence of renal mass, urinary tract calculus or hydronephrosis. No significant bladder findings. Stomach/Bowel: No evidence of bowel wall thickening, distention or surrounding inflammatory change. The appendix appears normal. Vascular/Lymphatic: Previously demonstrated hypermetabolic nodes in the upper abdomen have resolved. There are no enlarged abdominopelvic lymph nodes. No significant vascular findings. Reproductive: The uterus and ovaries appear normal. No adnexal mass. Other: There is no ascites or peritoneal nodularity. Intact anterior abdominal wall. Musculoskeletal: No acute or significant  osseous findings. Thoracolumbar scoliosis. IMPRESSION: 1. Further improvement in anterior mediastinal, right supraclavicular and upper abdominal lymphadenopathy consistent with treated neuroendocrine/small cell lung cancer. No progressive disease identified. 2. Stable small left pleural effusion and adjacent left lower lobe airspace disease with air bronchograms. This was only mildly hypermetabolic on PET-CT. No discrete pulmonary nodule identified. 3. Stable small pericardial effusion without residual pericardial nodularity. 4. Treated metastasis to the sternal manubrium. 5. Thrombus in the left hepatic vein. 6. Small low-density splenic lesion, not clearly seen previously, although of questionable significance. 7. Tip of the Port-A-Cath is low in  the right atrium, near the tricuspid valve, but appears unchanged. Electronically Signed   By: Richardean Sale M.D.   On: 07/28/2018 19:33   Ct Abdomen Pelvis W Contrast  Result Date: 07/28/2018 CLINICAL DATA:  Stage IV high-grade neuroendocrine left lung cancer diagnosed in January 2020. Chemotherapy in progress. EXAM: CT CHEST, ABDOMEN, AND PELVIS WITH CONTRAST TECHNIQUE: Multidetector CT imaging of the chest, abdomen and pelvis was performed following the standard protocol during bolus administration of intravenous contrast. CONTRAST:  148m OMNIPAQUE IOHEXOL 300 MG/ML  SOLN COMPARISON:  Chest CT 06/24/2018 and 04/06/2018.  PET-CT 05/02/2018. FINDINGS: CT CHEST FINDINGS Cardiovascular: Left IJ Port-A-Cath tip extends into the lower right atrium, near the tricuspid valve, but is unchanged. There are no acute vascular findings. A small pericardial effusion is stable. The heart size is normal. Mediastinum/Nodes: Further improvement in mediastinal adenopathy with the remaining nodes largely in the pre-vascular space. There is a component measuring up to 4.2 x 2.5 cm on image 18/2. There is no significant hilar adenopathy. Supraclavicular adenopathy has also improved with a 1.6 cm right supraclavicular node on image 4/2, incompletely visualized. No progressive adenopathy identified. The thyroid gland, trachea and esophagus demonstrate no significant findings. Lungs/Pleura: Stable trace left pleural effusion and adjacent left lower lobe airspace disease with air bronchograms. No new or enlarging pulmonary nodules. Musculoskeletal/Chest wall: Progressive sclerosis of the sternal manubrium at site of previously demonstrated hypermetabolic lytic metastases. No new lesions or acute osseous findings are identified. There is a healed fracture of the left 7th rib laterally. CT ABDOMEN AND PELVIS FINDINGS Hepatobiliary: No focal hepatic lesion or abnormal enhancement identified. However, there is linear low density in the  left hepatic lobe, likely due to thrombus in the left hepatic vein (images 40 through 43 of series 2). The additional hepatic veins, portal vein and IVC appear patent. No evidence of gallstones, gallbladder wall thickening or biliary dilatation. Pancreas: Unremarkable. No pancreatic ductal dilatation or surrounding inflammatory changes. Spleen: 6 mm low-density lesion superiorly in the spleen on image 31/2 is not clearly seen on previous studies, although is of questionable significance. The spleen is normal in size. Adrenals/Urinary Tract: Both adrenal glands appear normal. There are small bilateral renal cysts. No evidence of renal mass, urinary tract calculus or hydronephrosis. No significant bladder findings. Stomach/Bowel: No evidence of bowel wall thickening, distention or surrounding inflammatory change. The appendix appears normal. Vascular/Lymphatic: Previously demonstrated hypermetabolic nodes in the upper abdomen have resolved. There are no enlarged abdominopelvic lymph nodes. No significant vascular findings. Reproductive: The uterus and ovaries appear normal. No adnexal mass. Other: There is no ascites or peritoneal nodularity. Intact anterior abdominal wall. Musculoskeletal: No acute or significant osseous findings. Thoracolumbar scoliosis. IMPRESSION: 1. Further improvement in anterior mediastinal, right supraclavicular and upper abdominal lymphadenopathy consistent with treated neuroendocrine/small cell lung cancer. No progressive disease identified. 2. Stable small left pleural effusion and adjacent left lower lobe airspace disease  with air bronchograms. This was only mildly hypermetabolic on PET-CT. No discrete pulmonary nodule identified. 3. Stable small pericardial effusion without residual pericardial nodularity. 4. Treated metastasis to the sternal manubrium. 5. Thrombus in the left hepatic vein. 6. Small low-density splenic lesion, not clearly seen previously, although of questionable  significance. 7. Tip of the Port-A-Cath is low in the right atrium, near the tricuspid valve, but appears unchanged. Electronically Signed   By: Richardean Sale M.D.   On: 07/28/2018 19:33     ASSESSMENT/PLAN:  This is a very pleasant 58 year old African-American female with stage IV (T2AN3, M1C) high-grade neuroendocrine carcinoma with focal areas of small cell lung cancer.  She was diagnosed in January 2020. She is currently undergoing treatment with carboplatin for an AUC of 5 on day 1, etoposide 100 mg/m on days 1, 2, and 3 with Tecentriq and Neulasta support.  She is status post 3 cycles.  The patient recently had a restaging CT scan performed.  Dr. Julien Nordmann personally and independently reviewed the scan and discussed the results with the patient today. The scan showed improvement in her disease. We recommend proceeding with cycle #4 today as scheduled.   Her scan also showed a thrombus in the left hepatic vein. I have sent a prescription for the Xarelto starter pack to her pharmacy. She was advised to take her medication with food. She also was instructed to call us back once she completes the starter pack to receive a refill with the 20 mg prescription.   I will see her back in approximately 3 weeks for evaluation and repeat blood work prior to starting cycle #5.  For the patient's hypertension, the patient states she monitors her blood pressures at home every morning and it is within normal limits. She was advised to keep a log of her blood pressure at home and follow up with her PCP.   The patient does not believe her Onpro is delivering the medication appropriately. We will arrange for the patient to receive her Neulasta injection in the clinic.   The patient was advised to call immediately if she has any concerning symptoms in the interval. The patient voices understanding of current disease status and treatment options and is in agreement with the current care plan. All questions were  answered. The patient knows to call the clinic with any problems, questions or concerns. We can certainly see the patient much sooner if necessary  No orders of the defined types were placed in this encounter.    Zadiel Leyh L Amanii Snethen, PA-C 07/29/18  ADDENDUM: Hematology/Oncology Attending: I had a face-to-face encounter with the patient today.  I recommended her care plan.  This is a very pleasant 58 years old African-American female with a stage IV high-grade neuroendocrine carcinoma with focal areas of small cell carcinoma.  She is currently undergoing systemic chemotherapy with carboplatin and etoposide status post 3 cycles.  She has been tolerating this treatment well except for the aching pain.  She was receiving Onpro.  But she mentions that she did not feel the injection for the second time ago during the second cycle. The patient had repeat CT scan of the chest, abdomen and pelvis performed recently.  I personally and independently reviewed the scans and discussed the results with the patient today. PET scan showed improvement of her disease. This scan also showed thrombus in the left hepatic vein.  I recommended for the patient to continue her current systemic chemotherapy with carboplatin and etoposide. For the left hepatic  vein thrombosis, I will start the patient on Xarelto initially 15 mg p.o. twice daily for 3 weeks followed by 20 mg p.o. daily. The patient will come back for follow-up visit in 3 weeks for evaluation before starting cycle #5. For the failure of the Onpro, will change her treatment to Jonette Mate.  The patient was advised to call immediately if she has any concerning symptoms in the interval.  Disclaimer: This note was dictated with voice recognition software. Similar sounding words can inadvertently be transcribed and may be missed upon review. Eilleen Kempf, MD 07/29/18

## 2018-07-29 NOTE — Progress Notes (Signed)
Pt thinks Onpro administration is not accurate & prefers Udenyca per [employer], PA. Pts insur does not require PA.  Changed order back to Udenyca Day 5.  I have s/w Stefanie Libel (financial assistance) & she will contact pt to try to get copay assistance for [facility].  Kennith Center, Pharm.D., CPP 07/29/2018@1 :06 PM

## 2018-07-29 NOTE — Progress Notes (Signed)
Per Dr. Julien Nordmann, ok to treat with elevated BP.

## 2018-07-30 ENCOUNTER — Inpatient Hospital Stay: Payer: Medicaid Other

## 2018-07-30 ENCOUNTER — Other Ambulatory Visit: Payer: Self-pay

## 2018-07-30 VITALS — BP 127/93 | HR 100 | Temp 98.1°F | Resp 20

## 2018-07-30 DIAGNOSIS — C3412 Malignant neoplasm of upper lobe, left bronchus or lung: Secondary | ICD-10-CM

## 2018-07-30 DIAGNOSIS — Z5111 Encounter for antineoplastic chemotherapy: Secondary | ICD-10-CM | POA: Diagnosis not present

## 2018-07-30 MED ORDER — DEXAMETHASONE SODIUM PHOSPHATE 10 MG/ML IJ SOLN
INTRAMUSCULAR | Status: AC
  Filled 2018-07-30: qty 1

## 2018-07-30 MED ORDER — HEPARIN SOD (PORK) LOCK FLUSH 100 UNIT/ML IV SOLN
500.0000 [IU] | Freq: Once | INTRAVENOUS | Status: DC | PRN
  Filled 2018-07-30: qty 5

## 2018-07-30 MED ORDER — SODIUM CHLORIDE 0.9 % IV SOLN
Freq: Once | INTRAVENOUS | Status: AC
  Administered 2018-07-30: 10:00:00 via INTRAVENOUS
  Filled 2018-07-30: qty 250

## 2018-07-30 MED ORDER — SODIUM CHLORIDE 0.9 % IV SOLN
100.0000 mg/m2 | Freq: Once | INTRAVENOUS | Status: AC
  Administered 2018-07-30: 250 mg via INTRAVENOUS
  Filled 2018-07-30: qty 12.5

## 2018-07-30 MED ORDER — SODIUM CHLORIDE 0.9% FLUSH
10.0000 mL | INTRAVENOUS | Status: DC | PRN
  Filled 2018-07-30: qty 10

## 2018-07-30 MED ORDER — DEXAMETHASONE SODIUM PHOSPHATE 10 MG/ML IJ SOLN
10.0000 mg | Freq: Once | INTRAMUSCULAR | Status: AC
  Administered 2018-07-30: 10 mg via INTRAVENOUS

## 2018-07-30 NOTE — Patient Instructions (Signed)
[facility] Discharge Instructions for Patients Receiving Chemotherapy  Today you received the following chemotherapy agents Etoposide (VEPESID).  To help prevent nausea and vomiting after your treatment, we encourage you to take your nausea medication as prescribed.   If you develop nausea and vomiting that is not controlled by your nausea medication, call the clinic.   BELOW ARE SYMPTOMS THAT SHOULD BE REPORTED IMMEDIATELY:  *FEVER GREATER THAN 100.5 F  *CHILLS WITH OR WITHOUT FEVER  NAUSEA AND VOMITING THAT IS NOT CONTROLLED WITH YOUR NAUSEA MEDICATION  *UNUSUAL SHORTNESS OF BREATH  *UNUSUAL BRUISING OR BLEEDING  TENDERNESS IN MOUTH AND THROAT WITH OR WITHOUT PRESENCE OF ULCERS  *URINARY PROBLEMS  *BOWEL PROBLEMS  UNUSUAL RASH Items with * indicate a potential emergency and should be followed up as soon as possible.  Feel free to call the clinic should you have any questions or concerns. The clinic phone number is [phone].  Please show the [facility] at check-in to the Emergency Department and triage nurse.  Coronavirus (COVID-19) Are you at risk?  Are you at risk for the Coronavirus (COVID-19)?  To be considered HIGH RISK for Coronavirus (COVID-19), you have to meet the following criteria:  . Traveled to [place], [place], [place], [place] or [place]; or in the [place] to [place], [place], [place], or [place]; and have fever, cough, and shortness of breath within the last 2 weeks of travel OR . Been in close contact with a person diagnosed with COVID-19 within the last 2 weeks and have fever, cough, and shortness of breath . IF YOU DO NOT MEET THESE CRITERIA, YOU ARE CONSIDERED LOW RISK FOR COVID-19.  What to do if you are HIGH RISK for COVID-19?  Marland Kitchen If you are having a medical emergency, call 911. . Seek medical care right away. Before you go to a doctor's office, urgent care or emergency department, call ahead and tell them  about your recent travel, contact with someone diagnosed with COVID-19, and your symptoms. You should receive instructions from your physician's office regarding next steps of care.  . When you arrive at healthcare provider, tell the healthcare staff immediately you have returned from visiting [place], [place], [place], [place] or [place]; or traveled in the [place] to [place], [place], [place], or [place]; in the last two weeks or you have been in close contact with a person diagnosed with COVID-19 in the last 2 weeks.   . Tell the health care staff about your symptoms: fever, cough and shortness of breath. . After you have been seen by a medical provider, you will be either: o Tested for (COVID-19) and discharged home on quarantine except to seek medical care if symptoms worsen, and asked to  - Stay home and avoid contact with others until you get your results (4-5 days)  - Avoid travel on public transportation if possible (such as bus, train, or airplane) or o Sent to the Emergency Department by EMS for evaluation, COVID-19 testing, and possible admission depending on your condition and test results.  What to do if you are LOW RISK for COVID-19?  Reduce your risk of any infection by using the same precautions used for avoiding the common cold or flu:  Marland Kitchen Wash your hands often with soap and warm water for at least 20 seconds.  If soap and water are not readily available, use an alcohol-based hand sanitizer with at least 60% alcohol.  . If coughing or  sneezing, cover your mouth and nose by coughing or sneezing into the elbow areas of your shirt or coat, into a tissue or into your sleeve (not your hands). . Avoid shaking hands with others and consider head nods or verbal greetings only. . Avoid touching your eyes, nose, or mouth with unwashed hands.  . Avoid close contact with people who are sick. . Avoid places or events with large numbers of people in one location, like concerts or  sporting events. . Carefully consider travel plans you have or are making. . If you are planning any travel outside or inside the [place], visit the CDC's Travelers' Health webpage for the latest health notices. . If you have some symptoms but not all symptoms, continue to monitor at home and seek medical attention if your symptoms worsen. . If you are having a medical emergency, call 911.   [place] / e-Visit: [email or web address]         [facility]: [facility]: [phone]                   [facility]: [phone]

## 2018-07-31 ENCOUNTER — Other Ambulatory Visit: Payer: Self-pay

## 2018-07-31 ENCOUNTER — Inpatient Hospital Stay: Payer: Medicaid Other

## 2018-07-31 VITALS — BP 145/90 | HR 94 | Temp 97.9°F | Resp 18

## 2018-07-31 DIAGNOSIS — C3412 Malignant neoplasm of upper lobe, left bronchus or lung: Secondary | ICD-10-CM

## 2018-07-31 DIAGNOSIS — Z5111 Encounter for antineoplastic chemotherapy: Secondary | ICD-10-CM | POA: Diagnosis not present

## 2018-07-31 MED ORDER — SODIUM CHLORIDE 0.9 % IV SOLN
100.0000 mg/m2 | Freq: Once | INTRAVENOUS | Status: AC
  Administered 2018-07-31: 10:00:00 250 mg via INTRAVENOUS
  Filled 2018-07-31: qty 12.5

## 2018-07-31 MED ORDER — DEXAMETHASONE SODIUM PHOSPHATE 10 MG/ML IJ SOLN
INTRAMUSCULAR | Status: AC
  Filled 2018-07-31: qty 1

## 2018-07-31 MED ORDER — HEPARIN SOD (PORK) LOCK FLUSH 100 UNIT/ML IV SOLN
500.0000 [IU] | Freq: Once | INTRAVENOUS | Status: AC | PRN
  Administered 2018-07-31: 500 [IU]
  Filled 2018-07-31: qty 5

## 2018-07-31 MED ORDER — DEXAMETHASONE SODIUM PHOSPHATE 10 MG/ML IJ SOLN
10.0000 mg | Freq: Once | INTRAMUSCULAR | Status: AC
  Administered 2018-07-31: 10 mg via INTRAVENOUS

## 2018-07-31 MED ORDER — SODIUM CHLORIDE 0.9 % IV SOLN
Freq: Once | INTRAVENOUS | Status: AC
  Administered 2018-07-31: 09:00:00 via INTRAVENOUS
  Filled 2018-07-31: qty 250

## 2018-07-31 MED ORDER — SODIUM CHLORIDE 0.9% FLUSH
10.0000 mL | INTRAVENOUS | Status: DC | PRN
  Administered 2018-07-31: 11:00:00 10 mL
  Filled 2018-07-31: qty 10

## 2018-07-31 NOTE — Patient Instructions (Signed)
[facility] Discharge Instructions for Patients Receiving Chemotherapy  Today you received the following chemotherapy agents Etoposide (VEPESID).  To help prevent nausea and vomiting after your treatment, we encourage you to take your nausea medication as prescribed.   If you develop nausea and vomiting that is not controlled by your nausea medication, call the clinic.   BELOW ARE SYMPTOMS THAT SHOULD BE REPORTED IMMEDIATELY:  *FEVER GREATER THAN 100.5 F  *CHILLS WITH OR WITHOUT FEVER  NAUSEA AND VOMITING THAT IS NOT CONTROLLED WITH YOUR NAUSEA MEDICATION  *UNUSUAL SHORTNESS OF BREATH  *UNUSUAL BRUISING OR BLEEDING  TENDERNESS IN MOUTH AND THROAT WITH OR WITHOUT PRESENCE OF ULCERS  *URINARY PROBLEMS  *BOWEL PROBLEMS  UNUSUAL RASH Items with * indicate a potential emergency and should be followed up as soon as possible.  Feel free to call the clinic should you have any questions or concerns. The clinic phone number is [phone].  Please show the [facility] at check-in to the Emergency Department and triage nurse.  Coronavirus (COVID-19) Are you at risk?  Are you at risk for the Coronavirus (COVID-19)?  To be considered HIGH RISK for Coronavirus (COVID-19), you have to meet the following criteria:  . Traveled to [place], [place], [place], [place] or [place]; or in the [place] to [place], [place], [place], or [place]; and have fever, cough, and shortness of breath within the last 2 weeks of travel OR . Been in close contact with a person diagnosed with COVID-19 within the last 2 weeks and have fever, cough, and shortness of breath . IF YOU DO NOT MEET THESE CRITERIA, YOU ARE CONSIDERED LOW RISK FOR COVID-19.  What to do if you are HIGH RISK for COVID-19?  Marland Kitchen If you are having a medical emergency, call 911. . Seek medical care right away. Before you go to a doctor's office, urgent care or emergency department, call ahead and tell them  about your recent travel, contact with someone diagnosed with COVID-19, and your symptoms. You should receive instructions from your physician's office regarding next steps of care.  . When you arrive at healthcare provider, tell the healthcare staff immediately you have returned from visiting [place], [place], [place], [place] or [place]; or traveled in the [place] to [place], [place], [place], or [place]; in the last two weeks or you have been in close contact with a person diagnosed with COVID-19 in the last 2 weeks.   . Tell the health care staff about your symptoms: fever, cough and shortness of breath. . After you have been seen by a medical provider, you will be either: o Tested for (COVID-19) and discharged home on quarantine except to seek medical care if symptoms worsen, and asked to  - Stay home and avoid contact with others until you get your results (4-5 days)  - Avoid travel on public transportation if possible (such as bus, train, or airplane) or o Sent to the Emergency Department by EMS for evaluation, COVID-19 testing, and possible admission depending on your condition and test results.  What to do if you are LOW RISK for COVID-19?  Reduce your risk of any infection by using the same precautions used for avoiding the common cold or flu:  Marland Kitchen Wash your hands often with soap and warm water for at least 20 seconds.  If soap and water are not readily available, use an alcohol-based hand sanitizer with at least 60% alcohol.  . If coughing or  sneezing, cover your mouth and nose by coughing or sneezing into the elbow areas of your shirt or coat, into a tissue or into your sleeve (not your hands). . Avoid shaking hands with others and consider head nods or verbal greetings only. . Avoid touching your eyes, nose, or mouth with unwashed hands.  . Avoid close contact with people who are sick. . Avoid places or events with large numbers of people in one location, like concerts or  sporting events. . Carefully consider travel plans you have or are making. . If you are planning any travel outside or inside the [place], visit the CDC's Travelers' Health webpage for the latest health notices. . If you have some symptoms but not all symptoms, continue to monitor at home and seek medical attention if your symptoms worsen. . If you are having a medical emergency, call 911.   [place] / e-Visit: [email or web address]         [facility]: [facility]: [phone]                   [facility]: [phone]

## 2018-08-02 ENCOUNTER — Inpatient Hospital Stay: Payer: Medicaid Other

## 2018-08-02 ENCOUNTER — Other Ambulatory Visit: Payer: Self-pay

## 2018-08-02 VITALS — BP 119/91 | HR 118 | Temp 97.9°F | Resp 20

## 2018-08-02 DIAGNOSIS — C3412 Malignant neoplasm of upper lobe, left bronchus or lung: Secondary | ICD-10-CM

## 2018-08-02 DIAGNOSIS — Z5111 Encounter for antineoplastic chemotherapy: Secondary | ICD-10-CM | POA: Diagnosis not present

## 2018-08-02 MED ORDER — PEGFILGRASTIM-CBQV 6 MG/0.6ML ~~LOC~~ SOSY
PREFILLED_SYRINGE | SUBCUTANEOUS | Status: AC
  Filled 2018-08-02: qty 0.6

## 2018-08-02 MED ORDER — PEGFILGRASTIM-CBQV 6 MG/0.6ML ~~LOC~~ SOSY
6.0000 mg | PREFILLED_SYRINGE | Freq: Once | SUBCUTANEOUS | Status: AC
  Administered 2018-08-02: 12:00:00 6 mg via SUBCUTANEOUS

## 2018-08-05 ENCOUNTER — Inpatient Hospital Stay: Payer: Medicaid Other

## 2018-08-05 ENCOUNTER — Other Ambulatory Visit: Payer: Self-pay

## 2018-08-05 DIAGNOSIS — Z5111 Encounter for antineoplastic chemotherapy: Secondary | ICD-10-CM | POA: Diagnosis not present

## 2018-08-05 DIAGNOSIS — C3412 Malignant neoplasm of upper lobe, left bronchus or lung: Secondary | ICD-10-CM

## 2018-08-05 DIAGNOSIS — Z95828 Presence of other vascular implants and grafts: Secondary | ICD-10-CM

## 2018-08-05 LAB — CBC WITH DIFFERENTIAL (CANCER CENTER ONLY)
Abs Immature Granulocytes: 0.72 10*3/uL — ABNORMAL HIGH (ref 0.00–0.07)
Basophils Absolute: 0 10*3/uL (ref 0.0–0.1)
Basophils Relative: 1 %
Eosinophils Absolute: 0 10*3/uL (ref 0.0–0.5)
Eosinophils Relative: 0 %
HCT: 26.9 % — ABNORMAL LOW (ref 36.0–46.0)
Hemoglobin: 8.7 g/dL — ABNORMAL LOW (ref 12.0–15.0)
Immature Granulocytes: 0 %
Lymphocytes Relative: 43 %
Lymphs Abs: 1.1 10*3/uL (ref 0.7–4.0)
MCH: 28.3 pg (ref 26.0–34.0)
MCHC: 32.3 g/dL (ref 30.0–36.0)
MCV: 87.6 fL (ref 80.0–100.0)
Monocytes Absolute: 0 10*3/uL — ABNORMAL LOW (ref 0.1–1.0)
Monocytes Relative: 0 %
Neutro Abs: 2.7 10*3/uL (ref 1.7–7.7)
Neutrophils Relative %: 58 %
Platelet Count: 60 10*3/uL — ABNORMAL LOW (ref 150–400)
RBC: 3.07 MIL/uL — ABNORMAL LOW (ref 3.87–5.11)
RDW: 21.2 % — ABNORMAL HIGH (ref 11.5–15.5)
WBC Count: 4.6 10*3/uL (ref 4.0–10.5)
nRBC: 0 % (ref 0.0–0.2)

## 2018-08-05 LAB — CMP (CANCER CENTER ONLY)
ALT: 14 U/L (ref 0–44)
AST: 6 U/L — ABNORMAL LOW (ref 15–41)
Albumin: 3.4 g/dL — ABNORMAL LOW (ref 3.5–5.0)
Alkaline Phosphatase: 106 U/L (ref 38–126)
Anion gap: 9 (ref 5–15)
BUN: 16 mg/dL (ref 6–20)
CO2: 26 mmol/L (ref 22–32)
Calcium: 8.4 mg/dL — ABNORMAL LOW (ref 8.9–10.3)
Chloride: 103 mmol/L (ref 98–111)
Creatinine: 0.83 mg/dL (ref 0.44–1.00)
GFR, Est AFR Am: >60 mL/min (ref >60)
GFR, Estimated: >60 mL/min (ref >60)
Glucose, Bld: 119 mg/dL — ABNORMAL HIGH (ref 70–99)
Potassium: 3.6 mmol/L (ref 3.5–5.1)
Sodium: 138 mmol/L (ref 135–145)
Total Bilirubin: 1.5 mg/dL — ABNORMAL HIGH (ref 0.3–1.2)
Total Protein: 7 g/dL (ref 6.5–8.1)

## 2018-08-05 MED ORDER — SODIUM CHLORIDE 0.9% FLUSH
10.0000 mL | INTRAVENOUS | Status: DC | PRN
  Administered 2018-08-05: 10 mL
  Filled 2018-08-05: qty 10

## 2018-08-05 MED ORDER — HEPARIN SOD (PORK) LOCK FLUSH 100 UNIT/ML IV SOLN
500.0000 [IU] | Freq: Once | INTRAVENOUS | Status: AC | PRN
  Administered 2018-08-05: 500 [IU]
  Filled 2018-08-05: qty 5

## 2018-08-07 ENCOUNTER — Encounter: Payer: Self-pay | Admitting: Pharmacy Technician

## 2018-08-07 NOTE — Progress Notes (Signed)
The patient is approved for drug assistance by [employer] for [employer]. Enrollment is effective until 08/06/19 and is based on self pay. Drug replacement will begin on DOS 08/02/18.

## 2018-08-12 ENCOUNTER — Inpatient Hospital Stay: Payer: Medicaid Other

## 2018-08-12 ENCOUNTER — Other Ambulatory Visit: Payer: Self-pay

## 2018-08-12 DIAGNOSIS — Z5111 Encounter for antineoplastic chemotherapy: Secondary | ICD-10-CM | POA: Diagnosis not present

## 2018-08-12 DIAGNOSIS — C3412 Malignant neoplasm of upper lobe, left bronchus or lung: Secondary | ICD-10-CM

## 2018-08-12 LAB — CBC WITH DIFFERENTIAL (CANCER CENTER ONLY)
Abs Immature Granulocytes: 0.04 10*3/uL (ref 0.00–0.07)
Basophils Absolute: 0 10*3/uL (ref 0.0–0.1)
Basophils Relative: 0 %
Eosinophils Absolute: 0 10*3/uL (ref 0.0–0.5)
Eosinophils Relative: 0 %
HCT: 23 % — ABNORMAL LOW (ref 36.0–46.0)
Hemoglobin: 7.5 g/dL — ABNORMAL LOW (ref 12.0–15.0)
Immature Granulocytes: 1 %
Lymphocytes Relative: 42 %
Lymphs Abs: 1.5 10*3/uL (ref 0.7–4.0)
MCH: 29.4 pg (ref 26.0–34.0)
MCHC: 32.6 g/dL (ref 30.0–36.0)
MCV: 90.2 fL (ref 80.0–100.0)
Monocytes Absolute: 0.5 10*3/uL (ref 0.1–1.0)
Monocytes Relative: 13 %
Neutro Abs: 1.6 10*3/uL — ABNORMAL LOW (ref 1.7–7.7)
Neutrophils Relative %: 44 %
Platelet Count: 32 10*3/uL — ABNORMAL LOW (ref 150–400)
RBC: 2.55 MIL/uL — ABNORMAL LOW (ref 3.87–5.11)
RDW: 20.6 % — ABNORMAL HIGH (ref 11.5–15.5)
WBC Count: 3.6 10*3/uL — ABNORMAL LOW (ref 4.0–10.5)
nRBC: 0 % (ref 0.0–0.2)

## 2018-08-12 LAB — CMP (CANCER CENTER ONLY)
ALT: 11 U/L (ref 0–44)
AST: 6 U/L — ABNORMAL LOW (ref 15–41)
Albumin: 3.3 g/dL — ABNORMAL LOW (ref 3.5–5.0)
Alkaline Phosphatase: 115 U/L (ref 38–126)
Anion gap: 9 (ref 5–15)
BUN: 10 mg/dL (ref 6–20)
CO2: 28 mmol/L (ref 22–32)
Calcium: 8.7 mg/dL — ABNORMAL LOW (ref 8.9–10.3)
Chloride: 105 mmol/L (ref 98–111)
Creatinine: 0.91 mg/dL (ref 0.44–1.00)
GFR, Est AFR Am: >60 mL/min (ref >60)
GFR, Estimated: >60 mL/min (ref >60)
Glucose, Bld: 105 mg/dL — ABNORMAL HIGH (ref 70–99)
Potassium: 3.7 mmol/L (ref 3.5–5.1)
Sodium: 142 mmol/L (ref 135–145)
Total Bilirubin: 0.7 mg/dL (ref 0.3–1.2)
Total Protein: 6.9 g/dL (ref 6.5–8.1)

## 2018-08-19 ENCOUNTER — Other Ambulatory Visit: Payer: Self-pay

## 2018-08-19 ENCOUNTER — Inpatient Hospital Stay: Payer: Medicaid Other

## 2018-08-19 ENCOUNTER — Inpatient Hospital Stay: Payer: Medicaid Other | Attending: Internal Medicine | Admitting: Internal Medicine

## 2018-08-19 ENCOUNTER — Telehealth: Payer: Self-pay | Admitting: Internal Medicine

## 2018-08-19 ENCOUNTER — Encounter: Payer: Self-pay | Admitting: Internal Medicine

## 2018-08-19 VITALS — BP 137/93 | HR 102 | Temp 98.0°F | Resp 20 | Ht 67.0 in | Wt 281.3 lb

## 2018-08-19 VITALS — HR 93

## 2018-08-19 DIAGNOSIS — R5382 Chronic fatigue, unspecified: Secondary | ICD-10-CM

## 2018-08-19 DIAGNOSIS — C3412 Malignant neoplasm of upper lobe, left bronchus or lung: Secondary | ICD-10-CM

## 2018-08-19 DIAGNOSIS — Z5111 Encounter for antineoplastic chemotherapy: Secondary | ICD-10-CM | POA: Insufficient documentation

## 2018-08-19 DIAGNOSIS — Z5112 Encounter for antineoplastic immunotherapy: Secondary | ICD-10-CM

## 2018-08-19 DIAGNOSIS — Z95828 Presence of other vascular implants and grafts: Secondary | ICD-10-CM

## 2018-08-19 DIAGNOSIS — C7A1 Malignant poorly differentiated neuroendocrine tumors: Secondary | ICD-10-CM | POA: Insufficient documentation

## 2018-08-19 DIAGNOSIS — I10 Essential (primary) hypertension: Secondary | ICD-10-CM | POA: Insufficient documentation

## 2018-08-19 DIAGNOSIS — Z79899 Other long term (current) drug therapy: Secondary | ICD-10-CM | POA: Insufficient documentation

## 2018-08-19 DIAGNOSIS — C3492 Malignant neoplasm of unspecified part of left bronchus or lung: Secondary | ICD-10-CM

## 2018-08-19 LAB — CBC WITH DIFFERENTIAL (CANCER CENTER ONLY)
Abs Immature Granulocytes: 0.03 10*3/uL (ref 0.00–0.07)
Basophils Absolute: 0 10*3/uL (ref 0.0–0.1)
Basophils Relative: 0 %
Eosinophils Absolute: 0 10*3/uL (ref 0.0–0.5)
Eosinophils Relative: 0 %
HCT: 26.1 % — ABNORMAL LOW (ref 36.0–46.0)
Hemoglobin: 8 g/dL — ABNORMAL LOW (ref 12.0–15.0)
Immature Granulocytes: 1 %
Lymphocytes Relative: 37 %
Lymphs Abs: 1.3 10*3/uL (ref 0.7–4.0)
MCH: 29.1 pg (ref 26.0–34.0)
MCHC: 30.7 g/dL (ref 30.0–36.0)
MCV: 94.9 fL (ref 80.0–100.0)
Monocytes Absolute: 0.3 10*3/uL (ref 0.1–1.0)
Monocytes Relative: 9 %
Neutro Abs: 1.8 10*3/uL (ref 1.7–7.7)
Neutrophils Relative %: 53 %
Platelet Count: 138 10*3/uL — ABNORMAL LOW (ref 150–400)
RBC: 2.75 MIL/uL — ABNORMAL LOW (ref 3.87–5.11)
RDW: 23 % — ABNORMAL HIGH (ref 11.5–15.5)
WBC Count: 3.4 10*3/uL — ABNORMAL LOW (ref 4.0–10.5)
nRBC: 0.6 % — ABNORMAL HIGH (ref 0.0–0.2)

## 2018-08-19 LAB — CMP (CANCER CENTER ONLY)
ALT: 12 U/L (ref 0–44)
AST: 7 U/L — ABNORMAL LOW (ref 15–41)
Albumin: 3.4 g/dL — ABNORMAL LOW (ref 3.5–5.0)
Alkaline Phosphatase: 107 U/L (ref 38–126)
Anion gap: 10 (ref 5–15)
BUN: 13 mg/dL (ref 6–20)
CO2: 25 mmol/L (ref 22–32)
Calcium: 7.8 mg/dL — ABNORMAL LOW (ref 8.9–10.3)
Chloride: 107 mmol/L (ref 98–111)
Creatinine: 0.96 mg/dL (ref 0.44–1.00)
GFR, Est AFR Am: >60 mL/min (ref >60)
GFR, Estimated: >60 mL/min (ref >60)
Glucose, Bld: 127 mg/dL — ABNORMAL HIGH (ref 70–99)
Potassium: 3.7 mmol/L (ref 3.5–5.1)
Sodium: 142 mmol/L (ref 135–145)
Total Bilirubin: 0.5 mg/dL (ref 0.3–1.2)
Total Protein: 7.3 g/dL (ref 6.5–8.1)

## 2018-08-19 LAB — TSH: TSH: 1.132 u[IU]/mL (ref 0.308–3.960)

## 2018-08-19 MED ORDER — HEPARIN SOD (PORK) LOCK FLUSH 100 UNIT/ML IV SOLN
500.0000 [IU] | Freq: Once | INTRAVENOUS | Status: DC | PRN
  Filled 2018-08-19: qty 5

## 2018-08-19 MED ORDER — SODIUM CHLORIDE 0.9 % IV SOLN
1200.0000 mg | Freq: Once | INTRAVENOUS | Status: AC
  Administered 2018-08-19: 14:00:00 1200 mg via INTRAVENOUS
  Filled 2018-08-19: qty 20

## 2018-08-19 MED ORDER — SODIUM CHLORIDE 0.9 % IV SOLN
Freq: Once | INTRAVENOUS | Status: AC
  Administered 2018-08-19: 13:00:00 via INTRAVENOUS
  Filled 2018-08-19: qty 250

## 2018-08-19 MED ORDER — SODIUM CHLORIDE 0.9% FLUSH
10.0000 mL | INTRAVENOUS | Status: DC | PRN
  Filled 2018-08-19: qty 10

## 2018-08-19 MED ORDER — SODIUM CHLORIDE 0.9% FLUSH
10.0000 mL | INTRAVENOUS | Status: DC | PRN
  Administered 2018-08-19: 10 mL
  Filled 2018-08-19: qty 10

## 2018-08-19 NOTE — Patient Instructions (Signed)
[facility] Discharge Instructions for Patients Receiving Chemotherapy  Today you received the following chemotherapy agents: Tecentriq  To help prevent nausea and vomiting after your treatment, we encourage you to take your nausea medication as directed.   If you develop nausea and vomiting that is not controlled by your nausea medication, call the clinic.   BELOW ARE SYMPTOMS THAT SHOULD BE REPORTED IMMEDIATELY:  *FEVER GREATER THAN 100.5 F  *CHILLS WITH OR WITHOUT FEVER  NAUSEA AND VOMITING THAT IS NOT CONTROLLED WITH YOUR NAUSEA MEDICATION  *UNUSUAL SHORTNESS OF BREATH  *UNUSUAL BRUISING OR BLEEDING  TENDERNESS IN MOUTH AND THROAT WITH OR WITHOUT PRESENCE OF ULCERS  *URINARY PROBLEMS  *BOWEL PROBLEMS  UNUSUAL RASH Items with * indicate a potential emergency and should be followed up as soon as possible.  Feel free to call the clinic should you have any questions or concerns. The clinic phone number is [phone].  Please show the [facility] at check-in to the Emergency Department and triage nurse.  Coronavirus (COVID-19) Are you at risk?  Are you at risk for the Coronavirus (COVID-19)?  To be considered HIGH RISK for Coronavirus (COVID-19), you have to meet the following criteria:  . Traveled to [place], [place], [place], [place] or [place]; or in the [place] to [place], [place], [place], or [place]; and have fever, cough, and shortness of breath within the last 2 weeks of travel OR . Been in close contact with a person diagnosed with COVID-19 within the last 2 weeks and have fever, cough, and shortness of breath . IF YOU DO NOT MEET THESE CRITERIA, YOU ARE CONSIDERED LOW RISK FOR COVID-19.  What to do if you are HIGH RISK for COVID-19?  Marland Kitchen If you are having a medical emergency, call 911. . Seek medical care right away. Before you go to a doctor's office, urgent care or emergency department, call ahead and tell them about your  recent travel, contact with someone diagnosed with COVID-19, and your symptoms. You should receive instructions from your physician's office regarding next steps of care.  . When you arrive at healthcare provider, tell the healthcare staff immediately you have returned from visiting [place], [place], [place], [place] or [place]; or traveled in the [place] to [place], [place], [place], or [place]; in the last two weeks or you have been in close contact with a person diagnosed with COVID-19 in the last 2 weeks.   . Tell the health care staff about your symptoms: fever, cough and shortness of breath. . After you have been seen by a medical provider, you will be either: o Tested for (COVID-19) and discharged home on quarantine except to seek medical care if symptoms worsen, and asked to  - Stay home and avoid contact with others until you get your results (4-5 days)  - Avoid travel on public transportation if possible (such as bus, train, or airplane) or o Sent to the Emergency Department by EMS for evaluation, COVID-19 testing, and possible admission depending on your condition and test results.  What to do if you are LOW RISK for COVID-19?  Reduce your risk of any infection by using the same precautions used for avoiding the common cold or flu:  Marland Kitchen Wash your hands often with soap and warm water for at least 20 seconds.  If soap and water are not readily available, use an alcohol-based hand sanitizer with at least 60% alcohol.  . If coughing or sneezing,  cover your mouth and nose by coughing or sneezing into the elbow areas of your shirt or coat, into a tissue or into your sleeve (not your hands). . Avoid shaking hands with others and consider head nods or verbal greetings only. . Avoid touching your eyes, nose, or mouth with unwashed hands.  . Avoid close contact with people who are sick. . Avoid places or events with large numbers of people in one location, like concerts or sporting  events. . Carefully consider travel plans you have or are making. . If you are planning any travel outside or inside the [place], visit the CDC's Travelers' Health webpage for the latest health notices. . If you have some symptoms but not all symptoms, continue to monitor at home and seek medical attention if your symptoms worsen. . If you are having a medical emergency, call 911.   [place] / e-Visit: [email or web address]         [facility]: [facility]: [phone]                   [facility]: [phone]

## 2018-08-19 NOTE — Progress Notes (Signed)
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  PRIOR THERAPY: Carboplatin for AUC of 5 on day 1, etoposide 100 mg/M2 on days 1, 2 and 3 with Neulasta support in addition to [place].First dose February 10th, 2020. Status post 4 cycles   CURRENT THERAPY:  Maintenance treatment with Tecentriq 1200 mg IV every 3 weeks.  First dose Aug 19, 2018.  INTERVAL HISTORY: Samantha Santos 58 y.o. female returns to the clinic today for follow-up visit.  The patient is feeling fine today with no concerning complaints except for intermittent back pain.  She denied having any current chest pain, shortness of breath, cough or hemoptysis.  She denied having any fever or chills.  She has no nausea, vomiting, diarrhea or constipation.  She has no headache or visual changes.  She tolerated the last cycle of her treatment fairly well except for fatigue.  She is here today for evaluation before starting the first cycle of maintenance treatment with Tecentriq.   MEDICAL HISTORY: Past Medical History:  Diagnosis Date   Anemia    Arthritis    GERD (gastroesophageal reflux disease)    Hypertension    Morbid obesity ([place]) 05/06/2018   Supraclavicular adenopathy     ALLERGIES:  is allergic to penicillins and oxycodone.  MEDICATIONS:  Current Outpatient Medications  Medication Sig Dispense Refill   lidocaine-prilocaine (EMLA) cream Apply 1 application topically as needed. (Patient taking differently: Apply 1 application topically as needed (chemo port). ) 30 g 0   loratadine (CLARITIN) 10 MG tablet Take 10 mg by mouth daily.     losartan-hydrochlorothiazide (HYZAAR) 100-12.5 MG tablet Take 1 tablet by mouth daily. 90 tablet 3   prochlorperazine  (COMPAZINE) 10 MG tablet Take 1 tablet (10 mg total) by mouth every 6 (six) hours as needed for nausea or vomiting. 50 tablet 3   Rivaroxaban 15 & 20 MG TBPK Take as directed on package: Start with one 15mg  tablet by mouth twice a day with food. On Day 22, switch to one 20mg  tablet once a day with food. 51 each 0   traMADol (ULTRAM) 50 MG tablet Take 1 tablet (50 mg total) by mouth every 6 (six) hours as needed. 20 tablet 0   No current facility-administered medications for this visit.     SURGICAL HISTORY:  Past Surgical History:  Procedure Laterality Date   CESAREAN SECTION  1988   HYSTEROSCOPY W/D&C  06/14/2011   Procedure: DILATATION AND CURETTAGE /HYSTEROSCOPY;  Surgeon: Frederico Hamman, MD;  Location: [facility];  Service: Gynecology;  Laterality: N/A;   IR CV LINE INJECTION  06/25/2018   PORTACATH PLACEMENT Left 06/13/2018   Procedure: INSERTION PORT-A-CATH;  Surgeon: [name], MD;  Location: [facility];  Service: Thoracic;  Laterality: Left;   SUPRACLAVICAL NODE BIOPSY Right 05/10/2018   Procedure: SUPRACLAVICAL NODE BIOPSY;  Surgeon: [name], MD;  Location: [facility];  Service: Thoracic;  Laterality: Right;   TUBAL LIGATION      REVIEW OF SYSTEMS:  A comprehensive review of systems was negative except for: Constitutional: positive for fatigue Musculoskeletal: positive for back pain   PHYSICAL EXAMINATION: General appearance: alert, cooperative, fatigued and no distress Head: Normocephalic, without obvious abnormality, atraumatic Neck: no adenopathy, no JVD, supple,  symmetrical, trachea midline and thyroid not enlarged, symmetric, no tenderness/mass/nodules Lymph nodes: Cervical, supraclavicular, and axillary nodes normal. Resp: clear to auscultation bilaterally Back: symmetric, no curvature. ROM normal. No CVA tenderness. Cardio: regular rate and rhythm, S1, S2 normal, no murmur, click, rub or gallop GI: soft, non-tender; bowel sounds normal; no masses,   no organomegaly Extremities: extremities normal, atraumatic, no cyanosis or edema  ECOG PERFORMANCE STATUS: 1 - Symptomatic but completely ambulatory  Blood pressure (!) 137/93, pulse (!) 102, temperature 98 F (36.7 C), temperature source Oral, resp. rate 20, height 5\' 7"  (1.702 m), weight 281 lb 4.8 oz (127.6 kg), SpO2 100 %.  LABORATORY DATA: Lab Results  Component Value Date   WBC 3.4 (L) 08/19/2018   HGB 8.0 (L) 08/19/2018   HCT 26.1 (L) 08/19/2018   MCV 94.9 08/19/2018   PLT 138 (L) 08/19/2018      Chemistry      Component Value Date/Time   NA 142 08/19/2018 1107   NA 144 04/08/2018 1145   K 3.7 08/19/2018 1107   CL 107 08/19/2018 1107   CO2 25 08/19/2018 1107   BUN 13 08/19/2018 1107   BUN 10 04/08/2018 1145   CREATININE 0.96 08/19/2018 1107      Component Value Date/Time   CALCIUM 7.8 (L) 08/19/2018 1107   ALKPHOS 107 08/19/2018 1107   AST 7 (L) 08/19/2018 1107   ALT 12 08/19/2018 1107   BILITOT 0.5 08/19/2018 1107       RADIOGRAPHIC STUDIES: Ct Chest W Contrast  Result Date: 07/28/2018 CLINICAL DATA:  Stage IV high-grade neuroendocrine left lung cancer diagnosed in January 2020. Chemotherapy in progress. EXAM: CT CHEST, ABDOMEN, AND PELVIS WITH CONTRAST TECHNIQUE: Multidetector CT imaging of the chest, abdomen and pelvis was performed following the standard protocol during bolus administration of intravenous contrast. CONTRAST:  164mL OMNIPAQUE IOHEXOL 300 MG/ML  SOLN COMPARISON:  Chest CT 06/24/2018 and 04/06/2018.  PET-CT 05/02/2018. FINDINGS: CT CHEST FINDINGS Cardiovascular: Left IJ Port-A-Cath tip extends into the lower right atrium, near the tricuspid valve, but is unchanged. There are no acute vascular findings. A small pericardial effusion is stable. The heart size is normal. Mediastinum/Nodes: Further improvement in mediastinal adenopathy with the remaining nodes largely in the pre-vascular space. There is a component measuring up to 4.2 x 2.5 cm on image  18/2. There is no significant hilar adenopathy. Supraclavicular adenopathy has also improved with a 1.6 cm right supraclavicular node on image 4/2, incompletely visualized. No progressive adenopathy identified. The thyroid gland, trachea and esophagus demonstrate no significant findings. Lungs/Pleura: Stable trace left pleural effusion and adjacent left lower lobe airspace disease with air bronchograms. No new or enlarging pulmonary nodules. Musculoskeletal/Chest wall: Progressive sclerosis of the sternal manubrium at site of previously demonstrated hypermetabolic lytic metastases. No new lesions or acute osseous findings are identified. There is a healed fracture of the left 7th rib laterally. CT ABDOMEN AND PELVIS FINDINGS Hepatobiliary: No focal hepatic lesion or abnormal enhancement identified. However, there is linear low density in the left hepatic lobe, likely due to thrombus in the left hepatic vein (images 40 through 43 of series 2). The additional hepatic veins, portal vein and IVC appear patent. No evidence of gallstones, gallbladder wall thickening or biliary dilatation. Pancreas: Unremarkable. No pancreatic ductal dilatation or surrounding inflammatory changes. Spleen: 6 mm low-density lesion superiorly in the spleen on image 31/2 is not clearly seen on previous studies, although is of questionable significance. The spleen is normal in size. Adrenals/Urinary Tract: Both  adrenal glands appear normal. There are small bilateral renal cysts. No evidence of renal mass, urinary tract calculus or hydronephrosis. No significant bladder findings. Stomach/Bowel: No evidence of bowel wall thickening, distention or surrounding inflammatory change. The appendix appears normal. Vascular/Lymphatic: Previously demonstrated hypermetabolic nodes in the upper abdomen have resolved. There are no enlarged abdominopelvic lymph nodes. No significant vascular findings. Reproductive: The uterus and ovaries appear normal. No  adnexal mass. Other: There is no ascites or peritoneal nodularity. Intact anterior abdominal wall. Musculoskeletal: No acute or significant osseous findings. Thoracolumbar scoliosis. IMPRESSION: 1. Further improvement in anterior mediastinal, right supraclavicular and upper abdominal lymphadenopathy consistent with treated neuroendocrine/small cell lung cancer. No progressive disease identified. 2. Stable small left pleural effusion and adjacent left lower lobe airspace disease with air bronchograms. This was only mildly hypermetabolic on PET-CT. No discrete pulmonary nodule identified. 3. Stable small pericardial effusion without residual pericardial nodularity. 4. Treated metastasis to the sternal manubrium. 5. Thrombus in the left hepatic vein. 6. Small low-density splenic lesion, not clearly seen previously, although of questionable significance. 7. Tip of the Port-A-Cath is low in the right atrium, near the tricuspid valve, but appears unchanged. Electronically Signed   By: Richardean Sale M.D.   On: 07/28/2018 19:33   Ct Abdomen Pelvis W Contrast  Result Date: 07/28/2018 CLINICAL DATA:  Stage IV high-grade neuroendocrine left lung cancer diagnosed in January 2020. Chemotherapy in progress. EXAM: CT CHEST, ABDOMEN, AND PELVIS WITH CONTRAST TECHNIQUE: Multidetector CT imaging of the chest, abdomen and pelvis was performed following the standard protocol during bolus administration of intravenous contrast. CONTRAST:  148mL OMNIPAQUE IOHEXOL 300 MG/ML  SOLN COMPARISON:  Chest CT 06/24/2018 and 04/06/2018.  PET-CT 05/02/2018. FINDINGS: CT CHEST FINDINGS Cardiovascular: Left IJ Port-A-Cath tip extends into the lower right atrium, near the tricuspid valve, but is unchanged. There are no acute vascular findings. A small pericardial effusion is stable. The heart size is normal. Mediastinum/Nodes: Further improvement in mediastinal adenopathy with the remaining nodes largely in the pre-vascular space. There is a  component measuring up to 4.2 x 2.5 cm on image 18/2. There is no significant hilar adenopathy. Supraclavicular adenopathy has also improved with a 1.6 cm right supraclavicular node on image 4/2, incompletely visualized. No progressive adenopathy identified. The thyroid gland, trachea and esophagus demonstrate no significant findings. Lungs/Pleura: Stable trace left pleural effusion and adjacent left lower lobe airspace disease with air bronchograms. No new or enlarging pulmonary nodules. Musculoskeletal/Chest wall: Progressive sclerosis of the sternal manubrium at site of previously demonstrated hypermetabolic lytic metastases. No new lesions or acute osseous findings are identified. There is a healed fracture of the left 7th rib laterally. CT ABDOMEN AND PELVIS FINDINGS Hepatobiliary: No focal hepatic lesion or abnormal enhancement identified. However, there is linear low density in the left hepatic lobe, likely due to thrombus in the left hepatic vein (images 40 through 43 of series 2). The additional hepatic veins, portal vein and IVC appear patent. No evidence of gallstones, gallbladder wall thickening or biliary dilatation. Pancreas: Unremarkable. No pancreatic ductal dilatation or surrounding inflammatory changes. Spleen: 6 mm low-density lesion superiorly in the spleen on image 31/2 is not clearly seen on previous studies, although is of questionable significance. The spleen is normal in size. Adrenals/Urinary Tract: Both adrenal glands appear normal. There are small bilateral renal cysts. No evidence of renal mass, urinary tract calculus or hydronephrosis. No significant bladder findings. Stomach/Bowel: No evidence of bowel wall thickening, distention or surrounding inflammatory change. The appendix appears normal. Vascular/Lymphatic:  Previously demonstrated hypermetabolic nodes in the upper abdomen have resolved. There are no enlarged abdominopelvic lymph nodes. No significant vascular findings.  Reproductive: The uterus and ovaries appear normal. No adnexal mass. Other: There is no ascites or peritoneal nodularity. Intact anterior abdominal wall. Musculoskeletal: No acute or significant osseous findings. Thoracolumbar scoliosis. IMPRESSION: 1. Further improvement in anterior mediastinal, right supraclavicular and upper abdominal lymphadenopathy consistent with treated neuroendocrine/small cell lung cancer. No progressive disease identified. 2. Stable small left pleural effusion and adjacent left lower lobe airspace disease with air bronchograms. This was only mildly hypermetabolic on PET-CT. No discrete pulmonary nodule identified. 3. Stable small pericardial effusion without residual pericardial nodularity. 4. Treated metastasis to the sternal manubrium. 5. Thrombus in the left hepatic vein. 6. Small low-density splenic lesion, not clearly seen previously, although of questionable significance. 7. Tip of the Port-A-Cath is low in the right atrium, near the tricuspid valve, but appears unchanged. Electronically Signed   By: Richardean Sale M.D.   On: 07/28/2018 19:33    ASSESSMENT AND PLAN: This is a very pleasant 58 years old African-American female with stage IV high-grade neuroendocrine carcinoma and currently undergoing systemic chemotherapy with carboplatin and etoposide status post 4 cycles. The patient has been tolerating this treatment well with no concerning adverse effects except for fatigue. She has partial response to the treatment. I recommended for her to proceed with the first cycle of the maintenance treatment with Tecentriq. The patient will come back for follow-up visit in 3 weeks for evaluation before starting cycle #2. The patient was advised to call immediately if she has any concerning symptoms in the interval. The patient voices understanding of current disease status and treatment options and is in agreement with the current care plan. All questions were answered. The  patient knows to call the clinic with any problems, questions or concerns. We can certainly see the patient much sooner if necessary.  I spent 10 minutes counseling the patient face to face. The total time spent in the appointment was 15 minutes.  Disclaimer: This note was dictated with voice recognition software. Similar sounding words can inadvertently be transcribed and may not be corrected upon review.

## 2018-08-19 NOTE — Telephone Encounter (Signed)
Scheduled appt per 5/4 los - pt to get an updated schedule next visit.

## 2018-08-20 ENCOUNTER — Inpatient Hospital Stay: Payer: Medicaid Other

## 2018-08-21 ENCOUNTER — Ambulatory Visit: Payer: Self-pay

## 2018-08-23 ENCOUNTER — Other Ambulatory Visit: Payer: Self-pay | Admitting: *Deleted

## 2018-08-23 ENCOUNTER — Inpatient Hospital Stay: Payer: Medicaid Other

## 2018-08-23 MED ORDER — RIVAROXABAN 20 MG PO TABS: 20.0000 mg | ORAL_TABLET | Freq: Every day | ORAL | 2 refills | Status: DC

## 2018-08-26 ENCOUNTER — Other Ambulatory Visit: Payer: Self-pay

## 2018-08-26 ENCOUNTER — Encounter: Payer: Self-pay | Admitting: Pharmacy Technician

## 2018-08-26 NOTE — Progress Notes (Signed)
The patient is approved for drug assistance by [place] for [employer]  Enrollment is effective until 08/22/20 and is based on excessive OOP. Drug replacment will begin with DOS 08/19/18.

## 2018-08-27 ENCOUNTER — Telehealth: Payer: Self-pay | Admitting: Medical Oncology

## 2018-08-27 NOTE — Telephone Encounter (Signed)
Confirmed appts .

## 2018-09-02 ENCOUNTER — Other Ambulatory Visit: Payer: Self-pay

## 2018-09-04 ENCOUNTER — Telehealth: Payer: Self-pay | Admitting: Family Medicine

## 2018-09-04 NOTE — Telephone Encounter (Signed)
1) Medication(s) Requested (by name): Compazine  2) Pharmacy of Choice:  3) Special Requests:   Approved medications will be sent to the pharmacy, we will reach out if there is an issue.  Requests made after 3pm may not be addressed until the following business day!  If a patient is unsure of the name of the medication(s) please note and ask patient to call back when they are able to provide all info, do not send to responsible party until all information is available!

## 2018-09-10 ENCOUNTER — Inpatient Hospital Stay (HOSPITAL_BASED_OUTPATIENT_CLINIC_OR_DEPARTMENT_OTHER): Payer: Medicaid Other | Admitting: Internal Medicine

## 2018-09-10 ENCOUNTER — Inpatient Hospital Stay: Payer: Medicaid Other

## 2018-09-10 ENCOUNTER — Encounter: Payer: Self-pay | Admitting: Internal Medicine

## 2018-09-10 ENCOUNTER — Other Ambulatory Visit: Payer: Self-pay

## 2018-09-10 VITALS — BP 136/106 | HR 100 | Temp 98.0°F | Resp 20 | Ht 67.0 in | Wt 280.7 lb

## 2018-09-10 VITALS — BP 137/101

## 2018-09-10 DIAGNOSIS — Z5111 Encounter for antineoplastic chemotherapy: Secondary | ICD-10-CM | POA: Diagnosis not present

## 2018-09-10 DIAGNOSIS — Z5112 Encounter for antineoplastic immunotherapy: Secondary | ICD-10-CM

## 2018-09-10 DIAGNOSIS — C7A1 Malignant poorly differentiated neuroendocrine tumors: Secondary | ICD-10-CM | POA: Diagnosis not present

## 2018-09-10 DIAGNOSIS — Z79899 Other long term (current) drug therapy: Secondary | ICD-10-CM | POA: Diagnosis not present

## 2018-09-10 DIAGNOSIS — I10 Essential (primary) hypertension: Secondary | ICD-10-CM | POA: Diagnosis not present

## 2018-09-10 DIAGNOSIS — C3412 Malignant neoplasm of upper lobe, left bronchus or lung: Secondary | ICD-10-CM

## 2018-09-10 DIAGNOSIS — Z95828 Presence of other vascular implants and grafts: Secondary | ICD-10-CM

## 2018-09-10 DIAGNOSIS — C3492 Malignant neoplasm of unspecified part of left bronchus or lung: Secondary | ICD-10-CM

## 2018-09-10 DIAGNOSIS — R5382 Chronic fatigue, unspecified: Secondary | ICD-10-CM

## 2018-09-10 LAB — CBC WITH DIFFERENTIAL (CANCER CENTER ONLY)
Abs Immature Granulocytes: 0.01 10*3/uL (ref 0.00–0.07)
Basophils Absolute: 0 10*3/uL (ref 0.0–0.1)
Basophils Relative: 0 %
Eosinophils Absolute: 0.1 10*3/uL (ref 0.0–0.5)
Eosinophils Relative: 2 %
HCT: 32.7 % — ABNORMAL LOW (ref 36.0–46.0)
Hemoglobin: 10.1 g/dL — ABNORMAL LOW (ref 12.0–15.0)
Immature Granulocytes: 0 %
Lymphocytes Relative: 28 %
Lymphs Abs: 1.5 10*3/uL (ref 0.7–4.0)
MCH: 27.5 pg (ref 26.0–34.0)
MCHC: 30.9 g/dL (ref 30.0–36.0)
MCV: 89.1 fL (ref 80.0–100.0)
Monocytes Absolute: 0.4 10*3/uL (ref 0.1–1.0)
Monocytes Relative: 7 %
Neutro Abs: 3.4 10*3/uL (ref 1.7–7.7)
Neutrophils Relative %: 63 %
Platelet Count: 143 10*3/uL — ABNORMAL LOW (ref 150–400)
RBC: 3.67 MIL/uL — ABNORMAL LOW (ref 3.87–5.11)
RDW: 18.1 % — ABNORMAL HIGH (ref 11.5–15.5)
WBC Count: 5.5 10*3/uL (ref 4.0–10.5)
nRBC: 0 % (ref 0.0–0.2)

## 2018-09-10 LAB — CMP (CANCER CENTER ONLY)
ALT: 11 U/L (ref 0–44)
AST: 7 U/L — ABNORMAL LOW (ref 15–41)
Albumin: 3.4 g/dL — ABNORMAL LOW (ref 3.5–5.0)
Alkaline Phosphatase: 108 U/L (ref 38–126)
Anion gap: 9 (ref 5–15)
BUN: 9 mg/dL (ref 6–20)
CO2: 26 mmol/L (ref 22–32)
Calcium: 8.4 mg/dL — ABNORMAL LOW (ref 8.9–10.3)
Chloride: 108 mmol/L (ref 98–111)
Creatinine: 0.87 mg/dL (ref 0.44–1.00)
GFR, Est AFR Am: >60 mL/min (ref >60)
GFR, Estimated: >60 mL/min (ref >60)
Glucose, Bld: 106 mg/dL — ABNORMAL HIGH (ref 70–99)
Potassium: 3.2 mmol/L — ABNORMAL LOW (ref 3.5–5.1)
Sodium: 143 mmol/L (ref 135–145)
Total Bilirubin: 0.8 mg/dL (ref 0.3–1.2)
Total Protein: 7.4 g/dL (ref 6.5–8.1)

## 2018-09-10 LAB — TSH: TSH: 0.971 u[IU]/mL (ref 0.308–3.960)

## 2018-09-10 MED ORDER — SODIUM CHLORIDE 0.9% FLUSH
10.0000 mL | INTRAVENOUS | Status: DC | PRN
  Administered 2018-09-10: 10 mL
  Filled 2018-09-10: qty 10

## 2018-09-10 MED ORDER — SODIUM CHLORIDE 0.9 % IV SOLN
1200.0000 mg | Freq: Once | INTRAVENOUS | Status: AC
  Administered 2018-09-10: 1200 mg via INTRAVENOUS
  Filled 2018-09-10: qty 20

## 2018-09-10 MED ORDER — HEPARIN SOD (PORK) LOCK FLUSH 100 UNIT/ML IV SOLN
500.0000 [IU] | Freq: Once | INTRAVENOUS | Status: AC | PRN
  Administered 2018-09-10: 500 [IU]
  Filled 2018-09-10: qty 5

## 2018-09-10 MED ORDER — SODIUM CHLORIDE 0.9 % IV SOLN
Freq: Once | INTRAVENOUS | Status: AC
  Administered 2018-09-10: 13:00:00 via INTRAVENOUS
  Filled 2018-09-10: qty 250

## 2018-09-10 NOTE — Patient Instructions (Signed)
Coronavirus (COVID-19) Are you at risk?  Are you at risk for the Coronavirus (COVID-19)?  To be considered HIGH RISK for Coronavirus (COVID-19), you have to meet the following criteria:  . Traveled to [place], [place], [place], [place] or [place]; or in the [place] to [place], [place], [place], or [place]; and have fever, cough, and shortness of breath within the last 2 weeks of travel OR . Been in close contact with a person diagnosed with COVID-19 within the last 2 weeks and have fever, cough, and shortness of breath . IF YOU DO NOT MEET THESE CRITERIA, YOU ARE CONSIDERED LOW RISK FOR COVID-19.  What to do if you are HIGH RISK for COVID-19?  Marland Kitchen If you are having a medical emergency, call 911. . Seek medical care right away. Before you go to a doctor's office, urgent care or emergency department, call ahead and tell them about your recent travel, contact with someone diagnosed with COVID-19, and your symptoms. You should receive instructions from your physician's office regarding next steps of care.  . When you arrive at healthcare provider, tell the healthcare staff immediately you have returned from visiting [place], [place], [place], [place] or [place]; or traveled in the [place] to [place], [place], [place], or [place]; in the last two weeks or you have been in close contact with a person diagnosed with COVID-19 in the last 2 weeks.   . Tell the health care staff about your symptoms: fever, cough and shortness of breath. . After you have been seen by a medical provider, you will be either: o Tested for (COVID-19) and discharged home on quarantine except to seek medical care if symptoms worsen, and asked to  - Stay home and avoid contact with others until you get your results (4-5 days)  - Avoid travel on public transportation if possible (such as bus, train, or airplane) or o Sent to the Emergency Department by EMS for evaluation, COVID-19 testing, and possible  admission depending on your condition and test results.  What to do if you are LOW RISK for COVID-19?  Reduce your risk of any infection by using the same precautions used for avoiding the common cold or flu:  Marland Kitchen Wash your hands often with soap and warm water for at least 20 seconds.  If soap and water are not readily available, use an alcohol-based hand sanitizer with at least 60% alcohol.  . If coughing or sneezing, cover your mouth and nose by coughing or sneezing into the elbow areas of your shirt or coat, into a tissue or into your sleeve (not your hands). . Avoid shaking hands with others and consider head nods or verbal greetings only. . Avoid touching your eyes, nose, or mouth with unwashed hands.  . Avoid close contact with people who are sick. . Avoid places or events with large numbers of people in one location, like concerts or sporting events. . Carefully consider travel plans you have or are making. . If you are planning any travel outside or inside the [place], visit the CDC's Travelers' Health webpage for the latest health notices. . If you have some symptoms but not all symptoms, continue to monitor at home and seek medical attention if your symptoms worsen. . If you are having a medical emergency, call 911.   [place] / e-Visit: [email or web address]         [facility]: [place]  [facility]: [facility]: [place] Discharge Instructions for Patients Receiving Chemotherapy  Today you received the following chemotherapy agents Tecentriq  To help prevent nausea and vomiting after your treatment, we encourage you to take your nausea medication as directed.    If you develop nausea and vomiting that is not controlled by your nausea medication, call the clinic.   BELOW ARE  SYMPTOMS THAT SHOULD BE REPORTED IMMEDIATELY:  *FEVER GREATER THAN 100.5 F  *CHILLS WITH OR WITHOUT FEVER  NAUSEA AND VOMITING THAT IS NOT CONTROLLED WITH YOUR NAUSEA MEDICATION  *UNUSUAL SHORTNESS OF BREATH  *UNUSUAL BRUISING OR BLEEDING  TENDERNESS IN MOUTH AND THROAT WITH OR WITHOUT PRESENCE OF ULCERS  *URINARY PROBLEMS  *BOWEL PROBLEMS  UNUSUAL RASH Items with * indicate a potential emergency and should be followed up as soon as possible.  Feel free to call the clinic should you have any questions or concerns. The clinic phone number is [phone].  Please show the [facility] at check-in to the Emergency Department and triage nurse.  Atezolizumab injection What is this medicine? ATEZOLIZUMAB (a te zoe LIZ ue mab) is a monoclonal antibody. It is used to treat bladder cancer (urothelial cancer), non-small cell lung cancer, small cell lung cancer, and breast cancer. This medicine may be used for other purposes; ask your health care provider or pharmacist if you have questions. COMMON BRAND NAME(S): Tecentriq What should I tell my health care provider before I take this medicine? They need to know if you have any of these conditions: -diabetes -immune system problems -infection -inflammatory bowel disease -liver disease -lung or breathing disease -lupus -nervous system problems like myasthenia gravis or Guillain-Barre syndrome -organ transplant -an unusual or allergic reaction to atezolizumab, other medicines, foods, dyes, or preservatives -pregnant or trying to get pregnant -breast-feeding How should I use this medicine? This medicine is for infusion into a vein. It is given by a health care professional in a hospital or clinic setting. A special MedGuide will be given to you before each treatment. Be sure to read this information carefully each time. Talk to your pediatrician regarding the use of this medicine in children. Special care may be  needed. Overdosage: If you think you have taken too much of this medicine contact a poison control center or emergency room at once. NOTE: This medicine is only for you. Do not share this medicine with others. What if I miss a dose? It is important not to miss your dose. Call your doctor or health care professional if you are unable to keep an appointment. What may interact with this medicine? Interactions have not been studied. This list may not describe all possible interactions. Give your health care provider a list of all the medicines, herbs, non-prescription drugs, or dietary supplements you use. Also tell them if you smoke, drink alcohol, or use illegal drugs. Some items may interact with your medicine. What should I watch for while using this medicine? Your condition will be monitored carefully while you are receiving this medicine. You may need blood work done while you are taking this medicine. Do not become pregnant while taking this medicine or for at least 5 months after stopping it. Women should inform their doctor if they wish to become pregnant or think they might be pregnant. There is a potential for serious side effects  to an unborn child. Talk to your health care professional or pharmacist for more information. Do not breast-feed an infant while taking this medicine or for at least 5 months after the last dose. What side effects may I notice from receiving this medicine? Side effects that you should report to your doctor or health care professional as soon as possible: -allergic reactions like skin rash, itching or hives, swelling of the face, lips, or tongue -black, tarry stools -bloody or watery diarrhea -breathing problems -changes in vision -chest pain or chest tightness -chills -facial flushing -fever -headache -signs and symptoms of high blood sugar such as dizziness; dry mouth; dry skin; fruity breath; nausea; stomach pain; increased hunger or thirst; increased  urination -signs and symptoms of liver injury like dark yellow or brown urine; general ill feeling or flu-like symptoms; light-colored stools; loss of appetite; nausea; right upper belly pain; unusually weak or tired; yellowing of the eyes or skin -stomach pain -trouble passing urine or change in the amount of urine Side effects that usually do not require medical attention (report to your doctor or health care professional if they continue or are bothersome): -cough -diarrhea -joint pain -muscle pain -muscle weakness -tiredness -weight loss This list may not describe all possible side effects. Call your doctor for medical advice about side effects. You may report side effects to FDA at 1-800-FDA-1088. Where should I keep my medicine? This drug is given in a hospital or clinic and will not be stored at home. NOTE: This sheet is a summary. It may not cover all possible information. If you have questions about this medicine, talk to your doctor, pharmacist, or health care provider.  2019 Elsevier/Gold Standard (2017-07-06 09:33:38)

## 2018-09-10 NOTE — Progress Notes (Signed)
Ok to treat with BP today per MD Julien Nordmann

## 2018-09-10 NOTE — Progress Notes (Signed)
[facility] [phone]   [phone]  OFFICE PROGRESS NOTE  [name], MD [place] [address]  DIAGNOSIS: stage IV (T2 a, N3, M1 C) high-grade neuroendocrine carcinoma with focal areas of small cell carcinoma diagnosed in January 2020.  PRIOR THERAPY: Carboplatin for AUC of 5 on day 1, etoposide 100 mg/M2 on days 1, 2 and 3 with Neulasta support in addition to [place].First dose February 10th, 2020. Status post 5 cycles   CURRENT THERAPY:  Maintenance treatment with Tecentriq 1200 mg IV every 3 weeks.  First dose Sep 10, 2018.  INTERVAL HISTORY: Samantha Santos 58 y.o. female returns to the clinic today for follow-up visit.  The patient is feeling fine today with no concerning complaints except for mild fatigue.  She denied having any chest pain, shortness of breath, cough or hemoptysis.  She denied having any fever or chills.  She has no nausea, vomiting, diarrhea or constipation.  She denied having any significant weight loss or night sweats.  She is here today to start the first cycle of her maintenance treatment with Tecentriq.  MEDICAL HISTORY: Past Medical History:  Diagnosis Date  . Anemia   . Arthritis   . GERD (gastroesophageal reflux disease)   . Hypertension   . Morbid obesity ([place]) 05/06/2018  . Supraclavicular adenopathy     ALLERGIES:  is allergic to penicillins and oxycodone.  MEDICATIONS:  Current Outpatient Medications  Medication Sig Dispense Refill  . lidocaine-prilocaine (EMLA) cream Apply 1 application topically as needed. (Patient taking differently: Apply 1 application topically as needed (chemo port). ) 30 g 0  . loratadine (CLARITIN) 10 MG tablet Take 10 mg by mouth daily.    Marland Kitchen losartan-hydrochlorothiazide (HYZAAR) 100-12.5 MG tablet Take 1 tablet by mouth daily. 90 tablet 3  . prochlorperazine (COMPAZINE) 10 MG tablet Take 1 tablet (10 mg total) by mouth every 6 (six) hours as needed  for nausea or vomiting. 50 tablet 3  . rivaroxaban (XARELTO) 20 MG TABS tablet Take 1 tablet (20 mg total) by mouth daily with supper. 30 tablet 2  . traMADol (ULTRAM) 50 MG tablet Take 1 tablet (50 mg total) by mouth every 6 (six) hours as needed. 20 tablet 0   No current facility-administered medications for this visit.    Facility-Administered Medications Ordered in Other Visits  Medication Dose Route Frequency Provider Last Rate Last Dose  . sodium chloride flush (NS) 0.9 % injection 10 mL  10 mL Intracatheter PRN Curt Bears, MD   10 mL at 09/10/18 1136    SURGICAL HISTORY:  Past Surgical History:  Procedure Laterality Date  . CESAREAN SECTION  1988  . HYSTEROSCOPY W/D&C  06/14/2011   Procedure: DILATATION AND CURETTAGE /HYSTEROSCOPY;  Surgeon: Frederico Hamman, MD;  Location: [facility];  Service: Gynecology;  Laterality: N/A;  . IR CV LINE INJECTION  06/25/2018  . PORTACATH PLACEMENT Left 06/13/2018   Procedure: INSERTION PORT-A-CATH;  Surgeon: [name], MD;  Location: [facility];  Service: Thoracic;  Laterality: Left;  . SUPRACLAVICAL NODE BIOPSY Right 05/10/2018   Procedure: SUPRACLAVICAL NODE BIOPSY;  Surgeon: [name], MD;  Location: [facility];  Service: Thoracic;  Laterality: Right;  . TUBAL LIGATION      REVIEW OF SYSTEMS:  A comprehensive review of systems was negative except for: Constitutional: positive for fatigue   PHYSICAL EXAMINATION: General appearance: alert, cooperative, fatigued and no distress Head: Normocephalic, without obvious abnormality, atraumatic Neck:  no adenopathy, no JVD, supple, symmetrical, trachea midline and thyroid not enlarged, symmetric, no tenderness/mass/nodules Lymph nodes: Cervical, supraclavicular, and axillary nodes normal. Resp: clear to auscultation bilaterally Back: symmetric, no curvature. ROM normal. No CVA tenderness. Cardio: regular rate and rhythm, S1, S2 normal, no murmur, click, rub or gallop GI: soft,  non-tender; bowel sounds normal; no masses,  no organomegaly Extremities: extremities normal, atraumatic, no cyanosis or edema  ECOG PERFORMANCE STATUS: 1 - Symptomatic but completely ambulatory  Blood pressure (!) 136/106, pulse 100, temperature 98 F (36.7 C), temperature source Oral, resp. rate 20, height 5\' 7"  (1.702 m), weight 280 lb 11.2 oz (127.3 kg), SpO2 100 %.  LABORATORY DATA: Lab Results  Component Value Date   WBC 5.5 09/10/2018   HGB 10.1 (L) 09/10/2018   HCT 32.7 (L) 09/10/2018   MCV 89.1 09/10/2018   PLT 143 (L) 09/10/2018      Chemistry      Component Value Date/Time   NA 142 08/19/2018 1107   NA 144 04/08/2018 1145   K 3.7 08/19/2018 1107   CL 107 08/19/2018 1107   CO2 25 08/19/2018 1107   BUN 13 08/19/2018 1107   BUN 10 04/08/2018 1145   CREATININE 0.96 08/19/2018 1107      Component Value Date/Time   CALCIUM 7.8 (L) 08/19/2018 1107   ALKPHOS 107 08/19/2018 1107   AST 7 (L) 08/19/2018 1107   ALT 12 08/19/2018 1107   BILITOT 0.5 08/19/2018 1107       RADIOGRAPHIC STUDIES: No results found.  ASSESSMENT AND PLAN: This is a very pleasant 58 years old African-American female with stage IV high-grade neuroendocrine carcinoma and currently undergoing systemic chemotherapy with carboplatin and etoposide status post 5 cycles. The patient has been tolerating this treatment well with no concerning adverse effects except for fatigue. She has partial response to the treatment. The patient is here today to start the first cycle of maintenance treatment with immunotherapy with Tecentriq every 3 weeks. She is feeling fine. I recommended for her to proceed with the treatment as planned today. I will see her back for follow-up visit in 3 weeks for evaluation before the next cycle of her treatment. She was advised to call immediately if she has any concerning symptoms in the interval. All questions were answered. The patient knows to call the clinic with any  problems, questions or concerns. We can certainly see the patient much sooner if necessary.  I spent 10 minutes counseling the patient face to face. The total time spent in the appointment was 15 minutes.  Disclaimer: This note was dictated with voice recognition software. Similar sounding words can inadvertently be transcribed and may not be corrected upon review.

## 2018-09-11 ENCOUNTER — Ambulatory Visit: Payer: Self-pay

## 2018-09-11 ENCOUNTER — Telehealth: Payer: Self-pay | Admitting: Internal Medicine

## 2018-09-11 NOTE — Telephone Encounter (Signed)
Added additional cycle per 5/26 los - pt to get an updated schedule next visit.

## 2018-09-12 ENCOUNTER — Ambulatory Visit: Payer: Self-pay

## 2018-09-13 ENCOUNTER — Telehealth: Payer: Self-pay | Admitting: *Deleted

## 2018-09-13 NOTE — Telephone Encounter (Signed)
TCT patient regarding low K+. Spoke with patient and instructed her on K+ rich foods as her last K+ was low. Pt voiced understanding. No questions or concerns

## 2018-09-13 NOTE — Telephone Encounter (Signed)
-----   Message from Ardeen Garland, RN sent at 09/10/2018  5:04 PM EDT ----- Please call her ----- Message ----- From: Curt Bears, MD Sent: 09/10/2018  12:54 PM EDT To: Ardeen Garland, RN  K rich diet ----- Message ----- From: [place]: 09/10/2018  11:48 AM EDT To: Curt Bears, MD

## 2018-09-14 ENCOUNTER — Ambulatory Visit: Payer: Self-pay

## 2018-09-30 ENCOUNTER — Encounter: Payer: Self-pay | Admitting: Internal Medicine

## 2018-09-30 ENCOUNTER — Inpatient Hospital Stay: Payer: Medicaid Other

## 2018-09-30 ENCOUNTER — Inpatient Hospital Stay: Payer: Medicaid Other | Attending: Internal Medicine

## 2018-09-30 ENCOUNTER — Other Ambulatory Visit: Payer: Self-pay

## 2018-09-30 ENCOUNTER — Telehealth: Payer: Self-pay | Admitting: Internal Medicine

## 2018-09-30 ENCOUNTER — Inpatient Hospital Stay (HOSPITAL_BASED_OUTPATIENT_CLINIC_OR_DEPARTMENT_OTHER): Payer: Medicaid Other | Admitting: Internal Medicine

## 2018-09-30 VITALS — BP 121/86 | HR 89 | Temp 98.1°F | Resp 18 | Wt 280.0 lb

## 2018-09-30 DIAGNOSIS — Z5112 Encounter for antineoplastic immunotherapy: Secondary | ICD-10-CM

## 2018-09-30 DIAGNOSIS — C7A1 Malignant poorly differentiated neuroendocrine tumors: Secondary | ICD-10-CM | POA: Insufficient documentation

## 2018-09-30 DIAGNOSIS — I10 Essential (primary) hypertension: Secondary | ICD-10-CM | POA: Insufficient documentation

## 2018-09-30 DIAGNOSIS — C3412 Malignant neoplasm of upper lobe, left bronchus or lung: Secondary | ICD-10-CM

## 2018-09-30 DIAGNOSIS — Z95828 Presence of other vascular implants and grafts: Secondary | ICD-10-CM

## 2018-09-30 DIAGNOSIS — Z79899 Other long term (current) drug therapy: Secondary | ICD-10-CM | POA: Insufficient documentation

## 2018-09-30 DIAGNOSIS — R5382 Chronic fatigue, unspecified: Secondary | ICD-10-CM

## 2018-09-30 DIAGNOSIS — Z5111 Encounter for antineoplastic chemotherapy: Secondary | ICD-10-CM | POA: Diagnosis present

## 2018-09-30 DIAGNOSIS — C3492 Malignant neoplasm of unspecified part of left bronchus or lung: Secondary | ICD-10-CM

## 2018-09-30 DIAGNOSIS — C3490 Malignant neoplasm of unspecified part of unspecified bronchus or lung: Secondary | ICD-10-CM

## 2018-09-30 LAB — CMP (CANCER CENTER ONLY)
ALT: 14 U/L (ref 0–44)
AST: 11 U/L — ABNORMAL LOW (ref 15–41)
Albumin: 3.4 g/dL — ABNORMAL LOW (ref 3.5–5.0)
Alkaline Phosphatase: 99 U/L (ref 38–126)
Anion gap: 10 (ref 5–15)
BUN: 16 mg/dL (ref 6–20)
CO2: 25 mmol/L (ref 22–32)
Calcium: 8.6 mg/dL — ABNORMAL LOW (ref 8.9–10.3)
Chloride: 107 mmol/L (ref 98–111)
Creatinine: 0.97 mg/dL (ref 0.44–1.00)
GFR, Est AFR Am: >60 mL/min (ref >60)
GFR, Estimated: >60 mL/min (ref >60)
Glucose, Bld: 107 mg/dL — ABNORMAL HIGH (ref 70–99)
Potassium: 3.3 mmol/L — ABNORMAL LOW (ref 3.5–5.1)
Sodium: 142 mmol/L (ref 135–145)
Total Bilirubin: 0.6 mg/dL (ref 0.3–1.2)
Total Protein: 7.6 g/dL (ref 6.5–8.1)

## 2018-09-30 LAB — CBC WITH DIFFERENTIAL (CANCER CENTER ONLY)
Abs Immature Granulocytes: 0.02 10*3/uL (ref 0.00–0.07)
Basophils Absolute: 0 10*3/uL (ref 0.0–0.1)
Basophils Relative: 0 %
Eosinophils Absolute: 0.1 10*3/uL (ref 0.0–0.5)
Eosinophils Relative: 2 %
HCT: 33.1 % — ABNORMAL LOW (ref 36.0–46.0)
Hemoglobin: 10.3 g/dL — ABNORMAL LOW (ref 12.0–15.0)
Immature Granulocytes: 0 %
Lymphocytes Relative: 33 %
Lymphs Abs: 1.7 10*3/uL (ref 0.7–4.0)
MCH: 26.2 pg (ref 26.0–34.0)
MCHC: 31.1 g/dL (ref 30.0–36.0)
MCV: 84.2 fL (ref 80.0–100.0)
Monocytes Absolute: 0.4 10*3/uL (ref 0.1–1.0)
Monocytes Relative: 8 %
Neutro Abs: 2.9 10*3/uL (ref 1.7–7.7)
Neutrophils Relative %: 57 %
Platelet Count: 119 10*3/uL — ABNORMAL LOW (ref 150–400)
RBC: 3.93 MIL/uL (ref 3.87–5.11)
RDW: 17.5 % — ABNORMAL HIGH (ref 11.5–15.5)
WBC Count: 5 10*3/uL (ref 4.0–10.5)
nRBC: 0 % (ref 0.0–0.2)

## 2018-09-30 LAB — TSH: TSH: 1.388 u[IU]/mL (ref 0.308–3.960)

## 2018-09-30 MED ORDER — SODIUM CHLORIDE 0.9% FLUSH
10.0000 mL | INTRAVENOUS | Status: DC | PRN
  Administered 2018-09-30: 10 mL
  Filled 2018-09-30: qty 10

## 2018-09-30 MED ORDER — HEPARIN SOD (PORK) LOCK FLUSH 100 UNIT/ML IV SOLN
500.0000 [IU] | Freq: Once | INTRAVENOUS | Status: AC | PRN
  Administered 2018-09-30: 500 [IU]
  Filled 2018-09-30: qty 5

## 2018-09-30 MED ORDER — SODIUM CHLORIDE 0.9 % IV SOLN
Freq: Once | INTRAVENOUS | Status: AC
  Administered 2018-09-30: 10:00:00 via INTRAVENOUS
  Filled 2018-09-30: qty 250

## 2018-09-30 MED ORDER — SODIUM CHLORIDE 0.9 % IV SOLN
1200.0000 mg | Freq: Once | INTRAVENOUS | Status: AC
  Administered 2018-09-30: 1200 mg via INTRAVENOUS
  Filled 2018-09-30: qty 20

## 2018-09-30 NOTE — Progress Notes (Signed)
[facility] [phone]   [phone]  OFFICE PROGRESS NOTE  [name], MD [place] [address]  DIAGNOSIS: stage IV (T2 a, N3, M1 C) high-grade neuroendocrine carcinoma with focal areas of small cell carcinoma diagnosed in January 2020.  PRIOR THERAPY: Carboplatin for AUC of 5 on day 1, etoposide 100 mg/M2 on days 1, 2 and 3 with Neulasta support in addition to [place].First dose February 10th, 2020. Status post 5 cycles   CURRENT THERAPY:  Maintenance treatment with Tecentriq 1200 mg IV every 3 weeks.  First dose Sep 10, 2018.  Status post 2 cycles.  INTERVAL HISTORY: Samantha Santos 58 y.o. female returns to the clinic today for follow-up visit.  The patient is feeling fine today with no concerning complaints.  She continues to tolerate her treatment with Tecentriq fairly well.  She denied having any chest pain, shortness breath, cough or hemoptysis.  She denied having any fever chills.  She has no nausea, vomiting, diarrhea or constipation.  She denied having any headache or visual changes.  She has no skin rash.  She is here today for evaluation before starting cycle #3.   MEDICAL HISTORY: Past Medical History:  Diagnosis Date  . Anemia   . Arthritis   . GERD (gastroesophageal reflux disease)   . Hypertension   . Morbid obesity ([place]) 05/06/2018  . Supraclavicular adenopathy     ALLERGIES:  is allergic to penicillins and oxycodone.  MEDICATIONS:  Current Outpatient Medications  Medication Sig Dispense Refill  . lidocaine-prilocaine (EMLA) cream Apply 1 application topically as needed. (Patient taking differently: Apply 1 application topically as needed (chemo port). ) 30 g 0  . loratadine (CLARITIN) 10 MG tablet Take 10 mg by mouth daily.    Marland Kitchen losartan-hydrochlorothiazide (HYZAAR) 100-12.5 MG tablet Take 1 tablet by mouth daily. 90 tablet 3  . prochlorperazine (COMPAZINE) 10 MG tablet Take 1 tablet (10 mg  total) by mouth every 6 (six) hours as needed for nausea or vomiting. 50 tablet 3  . rivaroxaban (XARELTO) 20 MG TABS tablet Take 1 tablet (20 mg total) by mouth daily with supper. 30 tablet 2  . traMADol (ULTRAM) 50 MG tablet Take 1 tablet (50 mg total) by mouth every 6 (six) hours as needed. 20 tablet 0   No current facility-administered medications for this visit.    Facility-Administered Medications Ordered in Other Visits  Medication Dose Route Frequency Provider Last Rate Last Dose  . sodium chloride flush (NS) 0.9 % injection 10 mL  10 mL Intracatheter PRN Curt Bears, MD   10 mL at 09/30/18 0914    SURGICAL HISTORY:  Past Surgical History:  Procedure Laterality Date  . CESAREAN SECTION  1988  . HYSTEROSCOPY W/D&C  06/14/2011   Procedure: DILATATION AND CURETTAGE /HYSTEROSCOPY;  Surgeon: Frederico Hamman, MD;  Location: [facility];  Service: Gynecology;  Laterality: N/A;  . IR CV LINE INJECTION  06/25/2018  . PORTACATH PLACEMENT Left 06/13/2018   Procedure: INSERTION PORT-A-CATH;  Surgeon: [name], MD;  Location: [facility];  Service: Thoracic;  Laterality: Left;  . SUPRACLAVICAL NODE BIOPSY Right 05/10/2018   Procedure: SUPRACLAVICAL NODE BIOPSY;  Surgeon: [name], MD;  Location: [facility];  Service: Thoracic;  Laterality: Right;  . TUBAL LIGATION      REVIEW OF SYSTEMS:  A comprehensive review of systems was negative.   PHYSICAL EXAMINATION: General appearance: alert, cooperative and no distress Head: Normocephalic, without obvious  abnormality, atraumatic Neck: no adenopathy, no JVD, supple, symmetrical, trachea midline and thyroid not enlarged, symmetric, no tenderness/mass/nodules Lymph nodes: Cervical, supraclavicular, and axillary nodes normal. Resp: clear to auscultation bilaterally Back: symmetric, no curvature. ROM normal. No CVA tenderness. Cardio: regular rate and rhythm, S1, S2 normal, no murmur, click, rub or gallop GI: soft, non-tender; bowel  sounds normal; no masses,  no organomegaly Extremities: extremities normal, atraumatic, no cyanosis or edema  ECOG PERFORMANCE STATUS: 1 - Symptomatic but completely ambulatory  Blood pressure 121/86, pulse 89, temperature 98.1 F (36.7 C), temperature source Oral, resp. rate 18, weight 280 lb 0.6 oz (127 kg).  LABORATORY DATA: Lab Results  Component Value Date   WBC 5.5 09/10/2018   HGB 10.1 (L) 09/10/2018   HCT 32.7 (L) 09/10/2018   MCV 89.1 09/10/2018   PLT 143 (L) 09/10/2018      Chemistry      Component Value Date/Time   NA 143 09/10/2018 1136   NA 144 04/08/2018 1145   K 3.2 (L) 09/10/2018 1136   CL 108 09/10/2018 1136   CO2 26 09/10/2018 1136   BUN 9 09/10/2018 1136   BUN 10 04/08/2018 1145   CREATININE 0.87 09/10/2018 1136      Component Value Date/Time   CALCIUM 8.4 (L) 09/10/2018 1136   ALKPHOS 108 09/10/2018 1136   AST 7 (L) 09/10/2018 1136   ALT 11 09/10/2018 1136   BILITOT 0.8 09/10/2018 1136       RADIOGRAPHIC STUDIES: No results found.  ASSESSMENT AND PLAN: This is a very pleasant 58 years old African-American female with stage IV high-grade neuroendocrine carcinoma and currently undergoing systemic chemotherapy with carboplatin and etoposide status post 6 cycles. Starting from cycle #5 the patient is currently on maintenance treatment with Tecentriq every 3 weeks.  Status post 2 cycles of this treatment. I recommended for her to proceed with cycle #3 of her maintenance therapy today as planned. I will see her back for follow-up visit in 3 weeks for evaluation before the next cycle of her treatment with repeat CT scan of the chest, abdomen and pelvis for restaging of her disease. The patient was advised to call immediately if she has any concerning symptoms in the interval. All questions were answered. The patient knows to call the clinic with any problems, questions or concerns. We can certainly see the patient much sooner if necessary.  I spent 10  minutes counseling the patient face to face. The total time spent in the appointment was 15 minutes.  Disclaimer: This note was dictated with voice recognition software. Similar sounding words can inadvertently be transcribed and may not be corrected upon review.

## 2018-09-30 NOTE — Patient Instructions (Signed)
Coronavirus (COVID-19) Are you at risk?  Are you at risk for the Coronavirus (COVID-19)?  To be considered HIGH RISK for Coronavirus (COVID-19), you have to meet the following criteria:  . Traveled to [place], [place], [place], [place] or [place]; or in the [place] to [place], [place], [place], or [place]; and have fever, cough, and shortness of breath within the last 2 weeks of travel OR . Been in close contact with a person diagnosed with COVID-19 within the last 2 weeks and have fever, cough, and shortness of breath . IF YOU DO NOT MEET THESE CRITERIA, YOU ARE CONSIDERED LOW RISK FOR COVID-19.  What to do if you are HIGH RISK for COVID-19?  Marland Kitchen If you are having a medical emergency, call 911. . Seek medical care right away. Before you go to a doctor's office, urgent care or emergency department, call ahead and tell them about your recent travel, contact with someone diagnosed with COVID-19, and your symptoms. You should receive instructions from your physician's office regarding next steps of care.  . When you arrive at healthcare provider, tell the healthcare staff immediately you have returned from visiting [place], [place], [place], [place] or [place]; or traveled in the [place] to [place], [place], [place], or [place]; in the last two weeks or you have been in close contact with a person diagnosed with COVID-19 in the last 2 weeks.   . Tell the health care staff about your symptoms: fever, cough and shortness of breath. . After you have been seen by a medical provider, you will be either: o Tested for (COVID-19) and discharged home on quarantine except to seek medical care if symptoms worsen, and asked to  - Stay home and avoid contact with others until you get your results (4-5 days)  - Avoid travel on public transportation if possible (such as bus, train, or airplane) or o Sent to the Emergency Department by EMS for evaluation, COVID-19 testing, and possible  admission depending on your condition and test results.  What to do if you are LOW RISK for COVID-19?  Reduce your risk of any infection by using the same precautions used for avoiding the common cold or flu:  Marland Kitchen Wash your hands often with soap and warm water for at least 20 seconds.  If soap and water are not readily available, use an alcohol-based hand sanitizer with at least 60% alcohol.  . If coughing or sneezing, cover your mouth and nose by coughing or sneezing into the elbow areas of your shirt or coat, into a tissue or into your sleeve (not your hands). . Avoid shaking hands with others and consider head nods or verbal greetings only. . Avoid touching your eyes, nose, or mouth with unwashed hands.  . Avoid close contact with people who are sick. . Avoid places or events with large numbers of people in one location, like concerts or sporting events. . Carefully consider travel plans you have or are making. . If you are planning any travel outside or inside the [place], visit the CDC's Travelers' Health webpage for the latest health notices. . If you have some symptoms but not all symptoms, continue to monitor at home and seek medical attention if your symptoms worsen. . If you are having a medical emergency, call 911.   [facility] / e-Visit: [email or web address]         [facility]: [place]  [facility]: [facility]: [place] Discharge Instructions for Patients Receiving Chemotherapy  Today you received the following chemotherapy agents Tecentriq  To help prevent nausea and vomiting after your treatment, we encourage you to take your nausea medication as directed.    If you develop nausea and vomiting that is not controlled by your nausea medication, call the clinic.   BELOW ARE  SYMPTOMS THAT SHOULD BE REPORTED IMMEDIATELY:  *FEVER GREATER THAN 100.5 F  *CHILLS WITH OR WITHOUT FEVER  NAUSEA AND VOMITING THAT IS NOT CONTROLLED WITH YOUR NAUSEA MEDICATION  *UNUSUAL SHORTNESS OF BREATH  *UNUSUAL BRUISING OR BLEEDING  TENDERNESS IN MOUTH AND THROAT WITH OR WITHOUT PRESENCE OF ULCERS  *URINARY PROBLEMS  *BOWEL PROBLEMS  UNUSUAL RASH Items with * indicate a potential emergency and should be followed up as soon as possible.  Feel free to call the clinic should you have any questions or concerns. The clinic phone number is [phone].  Please show the [facility] at check-in to the Emergency Department and triage nurse.  Atezolizumab injection What is this medicine? ATEZOLIZUMAB (a te zoe LIZ ue mab) is a monoclonal antibody. It is used to treat bladder cancer (urothelial cancer), non-small cell lung cancer, small cell lung cancer, and breast cancer. This medicine may be used for other purposes; ask your health care provider or pharmacist if you have questions. COMMON BRAND NAME(S): Tecentriq What should I tell my health care provider before I take this medicine? They need to know if you have any of these conditions: -diabetes -immune system problems -infection -inflammatory bowel disease -liver disease -lung or breathing disease -lupus -nervous system problems like myasthenia gravis or Guillain-Barre syndrome -organ transplant -an unusual or allergic reaction to atezolizumab, other medicines, foods, dyes, or preservatives -pregnant or trying to get pregnant -breast-feeding How should I use this medicine? This medicine is for infusion into a vein. It is given by a health care professional in a hospital or clinic setting. A special MedGuide will be given to you before each treatment. Be sure to read this information carefully each time. Talk to your pediatrician regarding the use of this medicine in children. Special care may be  needed. Overdosage: If you think you have taken too much of this medicine contact a poison control center or emergency room at once. NOTE: This medicine is only for you. Do not share this medicine with others. What if I miss a dose? It is important not to miss your dose. Call your doctor or health care professional if you are unable to keep an appointment. What may interact with this medicine? Interactions have not been studied. This list may not describe all possible interactions. Give your health care provider a list of all the medicines, herbs, non-prescription drugs, or dietary supplements you use. Also tell them if you smoke, drink alcohol, or use illegal drugs. Some items may interact with your medicine. What should I watch for while using this medicine? Your condition will be monitored carefully while you are receiving this medicine. You may need blood work done while you are taking this medicine. Do not become pregnant while taking this medicine or for at least 5 months after stopping it. Women should inform their doctor if they wish to become pregnant or think they might be pregnant. There is a potential for serious side effects  to an unborn child. Talk to your health care professional or pharmacist for more information. Do not breast-feed an infant while taking this medicine or for at least 5 months after the last dose. What side effects may I notice from receiving this medicine? Side effects that you should report to your doctor or health care professional as soon as possible: -allergic reactions like skin rash, itching or hives, swelling of the face, lips, or tongue -black, tarry stools -bloody or watery diarrhea -breathing problems -changes in vision -chest pain or chest tightness -chills -facial flushing -fever -headache -signs and symptoms of high blood sugar such as dizziness; dry mouth; dry skin; fruity breath; nausea; stomach pain; increased hunger or thirst; increased  urination -signs and symptoms of liver injury like dark yellow or brown urine; general ill feeling or flu-like symptoms; light-colored stools; loss of appetite; nausea; right upper belly pain; unusually weak or tired; yellowing of the eyes or skin -stomach pain -trouble passing urine or change in the amount of urine Side effects that usually do not require medical attention (report to your doctor or health care professional if they continue or are bothersome): -cough -diarrhea -joint pain -muscle pain -muscle weakness -tiredness -weight loss This list may not describe all possible side effects. Call your doctor for medical advice about side effects. You may report side effects to FDA at 1-800-FDA-1088. Where should I keep my medicine? This drug is given in a hospital or clinic and will not be stored at home. NOTE: This sheet is a summary. It may not cover all possible information. If you have questions about this medicine, talk to your doctor, pharmacist, or health care provider.  2019 Elsevier/Gold Standard (2017-07-06 09:33:38)

## 2018-09-30 NOTE — Telephone Encounter (Signed)
Scheduled appt per los. Mailed printout

## 2018-10-16 ENCOUNTER — Ambulatory Visit (HOSPITAL_COMMUNITY)
Admission: RE | Admit: 2018-10-16 | Discharge: 2018-10-16 | Disposition: A | Discharge status: Home / Self Care | Payer: Medicaid Other | Source: Ambulatory Visit | Attending: Internal Medicine | Admitting: Internal Medicine

## 2018-10-16 ENCOUNTER — Other Ambulatory Visit: Payer: Self-pay

## 2018-10-16 ENCOUNTER — Encounter (HOSPITAL_COMMUNITY): Payer: Self-pay

## 2018-10-16 DIAGNOSIS — C3490 Malignant neoplasm of unspecified part of unspecified bronchus or lung: Secondary | ICD-10-CM

## 2018-10-16 MED ORDER — SODIUM CHLORIDE (PF) 0.9 % IJ SOLN
INTRAMUSCULAR | Status: AC
  Filled 2018-10-16: qty 50

## 2018-10-16 MED ORDER — IOHEXOL 300 MG/ML  SOLN
100.0000 mL | Freq: Once | INTRAMUSCULAR | Status: AC | PRN
  Administered 2018-10-16: 14:00:00 100 mL via INTRAVENOUS

## 2018-10-21 ENCOUNTER — Telehealth: Payer: Self-pay | Admitting: Internal Medicine

## 2018-10-21 ENCOUNTER — Other Ambulatory Visit: Payer: Self-pay

## 2018-10-21 ENCOUNTER — Encounter: Payer: Self-pay | Admitting: Internal Medicine

## 2018-10-21 ENCOUNTER — Inpatient Hospital Stay: Payer: Medicaid Other

## 2018-10-21 ENCOUNTER — Inpatient Hospital Stay: Payer: Medicaid Other | Attending: Internal Medicine | Admitting: Internal Medicine

## 2018-10-21 VITALS — BP 129/97 | HR 97 | Temp 98.0°F | Resp 18 | Ht 67.0 in | Wt 282.6 lb

## 2018-10-21 DIAGNOSIS — Z5111 Encounter for antineoplastic chemotherapy: Secondary | ICD-10-CM | POA: Diagnosis present

## 2018-10-21 DIAGNOSIS — C3412 Malignant neoplasm of upper lobe, left bronchus or lung: Secondary | ICD-10-CM

## 2018-10-21 DIAGNOSIS — M1990 Unspecified osteoarthritis, unspecified site: Secondary | ICD-10-CM | POA: Insufficient documentation

## 2018-10-21 DIAGNOSIS — C7A1 Malignant poorly differentiated neuroendocrine tumors: Secondary | ICD-10-CM

## 2018-10-21 DIAGNOSIS — R112 Nausea with vomiting, unspecified: Secondary | ICD-10-CM | POA: Insufficient documentation

## 2018-10-21 DIAGNOSIS — Z7901 Long term (current) use of anticoagulants: Secondary | ICD-10-CM | POA: Insufficient documentation

## 2018-10-21 DIAGNOSIS — Z95828 Presence of other vascular implants and grafts: Secondary | ICD-10-CM | POA: Insufficient documentation

## 2018-10-21 DIAGNOSIS — C3492 Malignant neoplasm of unspecified part of left bronchus or lung: Secondary | ICD-10-CM

## 2018-10-21 DIAGNOSIS — E860 Dehydration: Secondary | ICD-10-CM | POA: Insufficient documentation

## 2018-10-21 DIAGNOSIS — Z79899 Other long term (current) drug therapy: Secondary | ICD-10-CM | POA: Diagnosis not present

## 2018-10-21 DIAGNOSIS — C7B8 Other secondary neuroendocrine tumors: Secondary | ICD-10-CM

## 2018-10-21 DIAGNOSIS — I10 Essential (primary) hypertension: Secondary | ICD-10-CM | POA: Diagnosis not present

## 2018-10-21 DIAGNOSIS — K219 Gastro-esophageal reflux disease without esophagitis: Secondary | ICD-10-CM | POA: Diagnosis not present

## 2018-10-21 DIAGNOSIS — R5382 Chronic fatigue, unspecified: Secondary | ICD-10-CM

## 2018-10-21 DIAGNOSIS — R197 Diarrhea, unspecified: Secondary | ICD-10-CM | POA: Diagnosis not present

## 2018-10-21 LAB — CBC WITH DIFFERENTIAL (CANCER CENTER ONLY)
Abs Immature Granulocytes: 0.01 10*3/uL (ref 0.00–0.07)
Basophils Absolute: 0 10*3/uL (ref 0.0–0.1)
Basophils Relative: 0 %
Eosinophils Absolute: 0.1 10*3/uL (ref 0.0–0.5)
Eosinophils Relative: 1 %
HCT: 34.4 % — ABNORMAL LOW (ref 36.0–46.0)
Hemoglobin: 10.8 g/dL — ABNORMAL LOW (ref 12.0–15.0)
Immature Granulocytes: 0 %
Lymphocytes Relative: 37 %
Lymphs Abs: 1.8 10*3/uL (ref 0.7–4.0)
MCH: 25.2 pg — ABNORMAL LOW (ref 26.0–34.0)
MCHC: 31.4 g/dL (ref 30.0–36.0)
MCV: 80.4 fL (ref 80.0–100.0)
Monocytes Absolute: 0.3 10*3/uL (ref 0.1–1.0)
Monocytes Relative: 6 %
Neutro Abs: 2.6 10*3/uL (ref 1.7–7.7)
Neutrophils Relative %: 56 %
Platelet Count: 124 10*3/uL — ABNORMAL LOW (ref 150–400)
RBC: 4.28 MIL/uL (ref 3.87–5.11)
RDW: 17.7 % — ABNORMAL HIGH (ref 11.5–15.5)
WBC Count: 4.8 10*3/uL (ref 4.0–10.5)
nRBC: 0 % (ref 0.0–0.2)

## 2018-10-21 LAB — CMP (CANCER CENTER ONLY)
ALT: 10 U/L (ref 0–44)
AST: 9 U/L — ABNORMAL LOW (ref 15–41)
Albumin: 3.4 g/dL — ABNORMAL LOW (ref 3.5–5.0)
Alkaline Phosphatase: 109 U/L (ref 38–126)
Anion gap: 11 (ref 5–15)
BUN: 14 mg/dL (ref 6–20)
CO2: 25 mmol/L (ref 22–32)
Calcium: 8.5 mg/dL — ABNORMAL LOW (ref 8.9–10.3)
Chloride: 106 mmol/L (ref 98–111)
Creatinine: 1.04 mg/dL — ABNORMAL HIGH (ref 0.44–1.00)
GFR, Est AFR Am: >60 mL/min (ref >60)
GFR, Estimated: 60 mL/min — ABNORMAL LOW (ref >60)
Glucose, Bld: 115 mg/dL — ABNORMAL HIGH (ref 70–99)
Potassium: 3.1 mmol/L — ABNORMAL LOW (ref 3.5–5.1)
Sodium: 142 mmol/L (ref 135–145)
Total Bilirubin: 0.6 mg/dL (ref 0.3–1.2)
Total Protein: 7.8 g/dL (ref 6.5–8.1)

## 2018-10-21 LAB — TSH: TSH: 2.407 u[IU]/mL (ref 0.308–3.960)

## 2018-10-21 MED ORDER — HEPARIN SOD (PORK) LOCK FLUSH 100 UNIT/ML IV SOLN
500.0000 [IU] | Freq: Once | INTRAVENOUS | Status: AC | PRN
  Administered 2018-10-21: 09:00:00 500 [IU]
  Filled 2018-10-21: qty 5

## 2018-10-21 MED ORDER — POTASSIUM CHLORIDE ER 20 MEQ PO TBCR: 20.0000 meq | EXTENDED_RELEASE_TABLET | Freq: Every day | ORAL | 0 refills | Status: DC

## 2018-10-21 MED ORDER — SODIUM CHLORIDE 0.9% FLUSH
10.0000 mL | INTRAVENOUS | Status: DC | PRN
  Administered 2018-10-21: 10 mL
  Filled 2018-10-21: qty 10

## 2018-10-21 NOTE — Progress Notes (Signed)
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  PRIOR THERAPY:  1) Carboplatin for AUC of 5 on day 1, etoposide 100 mg/M2 on days 1, 2 and 3 with Neulasta support in addition to [place].First dose February 10th, 2020. Status post 4 cycles. 2) Maintenance treatment with Tecentriq 1200 mg IV every 3 weeks.  First dose Sep 10, 2018.  Status post 3 cycles.  This was discontinued secondary to disease progression.   CURRENT THERAPY:  Systemic chemotherapy with cisplatin 30 mg/M2 and irinotecan 65 mg/M2 on days 1 and 8 every 3 weeks.  First dose October 28, 2018  INTERVAL HISTORY: Samantha Santos 58 y.o. female returns to the clinic today for follow-up visit.  The patient is feeling fine today with no concerning complaints.  She has been tolerating her maintenance treatment with Tecentriq fairly well.  She denied having any chest pain, shortness of breath, cough or hemoptysis.  She denied having any recent weight loss or night sweats.  She has no nausea, vomiting, diarrhea or constipation.  She denied having any headache or visual changes.  She had repeat CT scan of the chest, abdomen pelvis performed recently and she is here for evaluation and discussion of her scan results.  MEDICAL HISTORY: Past Medical History:  Diagnosis Date   Anemia    Arthritis    GERD (gastroesophageal reflux disease)    Hypertension    Morbid obesity ([place]) 05/06/2018   Supraclavicular adenopathy     ALLERGIES:  is allergic to penicillins and oxycodone.  MEDICATIONS:  Current Outpatient Medications  Medication Sig Dispense Refill   lidocaine-prilocaine (EMLA) cream Apply 1 application topically as needed. (Patient taking differently: Apply 1 application  topically as needed (chemo port). ) 30 g 0   loratadine (CLARITIN) 10 MG tablet Take 10 mg by mouth daily.     losartan-hydrochlorothiazide (HYZAAR) 100-12.5 MG tablet Take 1 tablet by mouth daily. 90 tablet 3   prochlorperazine (COMPAZINE) 10 MG tablet Take 1 tablet (10 mg total) by mouth every 6 (six) hours as needed for nausea or vomiting. 50 tablet 3   rivaroxaban (XARELTO) 20 MG TABS tablet Take 1 tablet (20 mg total) by mouth daily with supper. 30 tablet 2   traMADol (ULTRAM) 50 MG tablet Take 1 tablet (50 mg total) by mouth every 6 (six) hours as needed. 20 tablet 0   No current facility-administered medications for this visit.    Facility-Administered Medications Ordered in Other Visits  Medication Dose Route Frequency Provider Last Rate Last Dose   sodium chloride flush (NS) 0.9 % injection 10 mL  10 mL Intracatheter PRN Curt Bears, MD   10 mL at 10/21/18 0844    SURGICAL HISTORY:  Past Surgical History:  Procedure Laterality Date   [place]   HYSTEROSCOPY W/D&C  06/14/2011   Procedure: DILATATION AND CURETTAGE /HYSTEROSCOPY;  Surgeon: Frederico Hamman, MD;  Location: [facility];  Service: Gynecology;  Laterality: N/A;   IR CV LINE INJECTION  06/25/2018   PORTACATH PLACEMENT Left 06/13/2018   Procedure: INSERTION PORT-A-CATH;  Surgeon: [name], MD;  Location: [facility];  Service: Thoracic;  Laterality: Left;   SUPRACLAVICAL NODE BIOPSY Right 05/10/2018   Procedure: SUPRACLAVICAL NODE BIOPSY;  Surgeon: Roxan Hockey,  Revonda Standard, MD;  Location: [facility];  Service: Thoracic;  Laterality: Right;   TUBAL LIGATION      REVIEW OF SYSTEMS:  Constitutional: positive for fatigue Eyes: negative Ears, nose, mouth, throat, and face: negative Respiratory: negative Cardiovascular: negative Gastrointestinal: negative Genitourinary:negative Integument/breast: negative Hematologic/lymphatic: negative Musculoskeletal:negative Neurological:  negative Behavioral/Psych: negative Endocrine: negative Allergic/Immunologic: negative   PHYSICAL EXAMINATION: General appearance: alert, cooperative and no distress Head: Normocephalic, without obvious abnormality, atraumatic Neck: no adenopathy, no JVD, supple, symmetrical, trachea midline and thyroid not enlarged, symmetric, no tenderness/mass/nodules Lymph nodes: Cervical, supraclavicular, and axillary nodes normal. Resp: clear to auscultation bilaterally Back: symmetric, no curvature. ROM normal. No CVA tenderness. Cardio: regular rate and rhythm, S1, S2 normal, no murmur, click, rub or gallop GI: soft, non-tender; bowel sounds normal; no masses,  no organomegaly Extremities: extremities normal, atraumatic, no cyanosis or edema Neurologic: Alert and oriented X 3, normal strength and tone. Normal symmetric reflexes. Normal coordination and gait  ECOG PERFORMANCE STATUS: 1 - Symptomatic but completely ambulatory  Blood pressure (!) 129/97, pulse 97, temperature 98 F (36.7 C), temperature source Oral, resp. rate 18, height 5\' 7"  (1.702 m), weight 282 lb 9.6 oz (128.2 kg), SpO2 98 %.  LABORATORY DATA: Lab Results  Component Value Date   WBC 5.0 09/30/2018   HGB 10.3 (L) 09/30/2018   HCT 33.1 (L) 09/30/2018   MCV 84.2 09/30/2018   PLT 119 (L) 09/30/2018      Chemistry      Component Value Date/Time   NA 142 09/30/2018 0920   NA 144 04/08/2018 1145   K 3.3 (L) 09/30/2018 0920   CL 107 09/30/2018 0920   CO2 25 09/30/2018 0920   BUN 16 09/30/2018 0920   BUN 10 04/08/2018 1145   CREATININE 0.97 09/30/2018 0920      Component Value Date/Time   CALCIUM 8.6 (L) 09/30/2018 0920   ALKPHOS 99 09/30/2018 0920   AST 11 (L) 09/30/2018 0920   ALT 14 09/30/2018 0920   BILITOT 0.6 09/30/2018 0920       RADIOGRAPHIC STUDIES: Ct Chest W Contrast  Result Date: 10/16/2018 CLINICAL DATA:  History of lung cancer. EXAM: CT CHEST, ABDOMEN, AND PELVIS WITH CONTRAST TECHNIQUE:  Multidetector CT imaging of the chest, abdomen and pelvis was performed following the standard protocol during bolus administration of intravenous contrast. CONTRAST:  166mL OMNIPAQUE IOHEXOL 300 MG/ML  SOLN COMPARISON:  CT chest, abdomen and pelvis 07/26/2018 FINDINGS: CT CHEST FINDINGS Cardiovascular: The heart size appears within normal limits. No pericardial effusion. Mediastinum/Nodes: Normal appearance of the thyroid gland. The trachea appears patent and is midline. Small hiatal hernia. Anterior mediastinal mass measures 6.3 x 3.3 cm, image 20/2. On the previous examination this measured 5.9 x 2.4 cm. Nodal mass within the left prevascular anterior mediastinum measures 2.8 x 2.1 cm, image 22/2. Previously 1.5 x 1.2 cm. New enlarged left axillary lymph node has a short axis of 1.7 cm, image 16/2. New left supraclavicular lymph node measures 1.2 cm, image 9/2. Index right supraclavicular lymph node measures 2.2 cm, image 4/2. Previously 1.6 cm. - New right CP angle node measures 1.9 cm, image 36/2. Pericardial nodule is new measuring 1.6 cm, image 37/2. Lungs/Pleura: No pleural effusion. Small left effusion has resolved in the interval. Wedge-shaped area of subsegmental atelectasis within the posterior left lower lobe is unchanged, image 71/6. Musculoskeletal: Stable sclerotic lesion involving the sternal manubrium. No new bone lesions. CT ABDOMEN PELVIS FINDINGS Hepatobiliary: No focal liver abnormality. Previously noted thrombus  in left hepatic vein is resolved. Gallbladder normal. No biliary ductal dilatation. Pancreas: Normal appearance of the pancreas. Spleen: Normal size spleen containing several stable, indeterminate low-attenuation foci. Adrenals/Urinary Tract: The adrenal glands appear normal. Bilateral kidney cysts. No hydronephrosis. The urinary bladder is normal. Stomach/Bowel: Stomach is within normal limits. Appendix appears normal. No evidence of bowel wall thickening, distention, or inflammatory  changes. Vascular/Lymphatic: No significant vascular findings are present. No enlarged abdominal or pelvic lymph nodes. Reproductive: Uterus and bilateral adnexa are unremarkable. Other: No abdominal wall hernia or abnormality. No abdominopelvic ascites. Musculoskeletal: No acute or significant osseous findings. IMPRESSION: 1. Since 07/26/2018 there has been interval progression of disease. Interval increase in size of mediastinal, bilateral supraclavicular, and left axillary adenopathy. New right CP angle adenopathy. 2. Resolution of small left effusion. Unchanged left lower lobe wedge-shaped area of subsegmental atelectasis. 3. 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 4. Resolution of thrombus in left hepatic vein. 5. Unchanged small low-density splenic lesions. Electronically Signed   By: Kerby Moors M.D.   On: 10/16/2018 14:49   Ct Abdomen Pelvis W Contrast  Result Date: 10/16/2018 CLINICAL DATA:  History of lung cancer. EXAM: CT CHEST, ABDOMEN, AND PELVIS WITH CONTRAST TECHNIQUE: Multidetector CT imaging of the chest, abdomen and pelvis was performed following the standard protocol during bolus administration of intravenous contrast. CONTRAST:  154mL OMNIPAQUE IOHEXOL 300 MG/ML  SOLN COMPARISON:  CT chest, abdomen and pelvis 07/26/2018 FINDINGS: CT CHEST FINDINGS Cardiovascular: The heart size appears within normal limits. No pericardial effusion. Mediastinum/Nodes: Normal appearance of the thyroid gland. The trachea appears patent and is midline. Small hiatal hernia. Anterior mediastinal mass measures 6.3 x 3.3 cm, image 20/2. On the previous examination this measured 5.9 x 2.4 cm. Nodal mass within the left prevascular anterior mediastinum measures 2.8 x 2.1 cm, image 22/2. Previously 1.5 x 1.2 cm. New enlarged left axillary lymph node has a short axis of 1.7 cm, image 16/2. New left supraclavicular lymph node measures 1.2 cm, image 9/2. Index right supraclavicular lymph node  measures 2.2 cm, image 4/2. Previously 1.6 cm. - New right CP angle node measures 1.9 cm, image 36/2. Pericardial nodule is new measuring 1.6 cm, image 37/2. Lungs/Pleura: No pleural effusion. Small left effusion has resolved in the interval. Wedge-shaped area of subsegmental atelectasis within the posterior left lower lobe is unchanged, image 71/6. Musculoskeletal: Stable sclerotic lesion involving the sternal manubrium. No new bone lesions. CT ABDOMEN PELVIS FINDINGS Hepatobiliary: No focal liver abnormality. Previously noted thrombus in left hepatic vein is resolved. Gallbladder normal. No biliary ductal dilatation. Pancreas: Normal appearance of the pancreas. Spleen: Normal size spleen containing several stable, indeterminate low-attenuation foci. Adrenals/Urinary Tract: The adrenal glands appear normal. Bilateral kidney cysts. No hydronephrosis. The urinary bladder is normal. Stomach/Bowel: Stomach is within normal limits. Appendix appears normal. No evidence of bowel wall thickening, distention, or inflammatory changes. Vascular/Lymphatic: No significant vascular findings are present. No enlarged abdominal or pelvic lymph nodes. Reproductive: Uterus and bilateral adnexa are unremarkable. Other: No abdominal wall hernia or abnormality. No abdominopelvic ascites. Musculoskeletal: No acute or significant osseous findings. IMPRESSION: 1. Since 07/26/2018 there has been interval progression of disease. Interval increase in size of mediastinal, bilateral supraclavicular, and left axillary adenopathy. New right CP angle adenopathy. 2. Resolution of small left effusion. Unchanged left lower lobe wedge-shaped area of subsegmental atelectasis. 3. 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 4. Resolution of thrombus in left hepatic vein. 5. Unchanged small low-density splenic lesions.  Electronically Signed   By: Kerby Moors M.D.   On: 10/16/2018 14:49    ASSESSMENT AND PLAN: This is a very  pleasant 58 years old African-American female with stage IV high-grade neuroendocrine carcinoma and currently undergoing systemic chemotherapy with carboplatin and etoposide and Tecentriq status post 7 cycles. Starting from cycle #5 the patient has been on treatment with maintenance Tecentriq status post 3 cycles. She has been tolerating the treatment well with no concerning adverse effects. She had repeat CT scan of the chest, abdomen pelvis performed recently.  I personally and independently reviewed the scans and discussed the result and showed the images to the patient today.  Unfortunately her scan showed evidence for disease progression and the mediastinal as well as bilateral supraclavicular and left axillary adenopathy. I recommended for the patient to discontinue her current treatment with Tecentriq for now. We discussed other treatment options including palliative care versus palliative systemic chemotherapy with reduced dose cisplatin and irinotecan.  The patient is interested in systemic chemotherapy and she will be treated with cisplatin 30 mg/M2 and irinotecan 65 mg/M2 on days 1 and 8 every 3 weeks.  She is expected to start the first cycle of this treatment on October 28, 2018. I discussed with the patient the adverse effect of this treatment and she would like to proceed with it as planned. She will come back for follow-up visit in 4 weeks for evaluation with the start of cycle #2. The patient was advised to call immediately if she has any concerning symptoms in the interval. All questions were answered. The patient knows to call the clinic with any problems, questions or concerns. We can certainly see the patient much sooner if necessary.  Disclaimer: This note was dictated with voice recognition software. Similar sounding words can inadvertently be transcribed and may not be corrected upon review.

## 2018-10-21 NOTE — Progress Notes (Signed)
DISCONTINUE ON PATHWAY REGIMEN - Small Cell Lung     Cycles 1 through 4, every 21 days:     Atezolizumab      Carboplatin      Etoposide    Cycles 5 and beyond, every 21 days:     Atezolizumab   **Always confirm dose/schedule in your pharmacy ordering system**  REASON: Disease Progression PRIOR TREATMENT: [id number]: Atezolizumab 1,200 mg D1 + Carboplatin AUC=5 D1 + Etoposide 100 mg/m2 D1-3 q21 Days x 4 Cycles, Followed by Atezolizumab 1,200 mg Maintenance Until Progression or Unacceptable Toxicity TREATMENT RESPONSE: Progressive Disease (PD)  START OFF PATHWAY REGIMEN - Small Cell Lung   [id number]:Cisplatin + Irinotecan:   A cycle is every 21 days:     Irinotecan      Cisplatin   **Always confirm dose/schedule in your pharmacy ordering system**  Patient Characteristics: Relapsed or Progressive Disease, Second Line, Relapse < 3 Months Therapeutic Status: Relapsed or Progressive Disease Line of Therapy: Second Line Time to Relapse: Relapse < 3 Months Intent of Therapy: Non-Curative / Palliative Intent, Discussed with Patient

## 2018-10-21 NOTE — Telephone Encounter (Signed)
Scheduled per los. Gave avs and calendar  

## 2018-10-21 NOTE — Addendum Note (Signed)
Addended by: Scot Dock on: 10/21/2018 09:28 AM   Modules accepted: Orders

## 2018-10-28 ENCOUNTER — Inpatient Hospital Stay: Payer: Medicaid Other

## 2018-10-28 ENCOUNTER — Other Ambulatory Visit: Payer: Self-pay

## 2018-10-28 VITALS — BP 108/80 | HR 97 | Temp 98.7°F | Resp 16 | Ht 67.0 in | Wt 282.2 lb

## 2018-10-28 DIAGNOSIS — C3412 Malignant neoplasm of upper lobe, left bronchus or lung: Secondary | ICD-10-CM

## 2018-10-28 DIAGNOSIS — Z5111 Encounter for antineoplastic chemotherapy: Secondary | ICD-10-CM | POA: Diagnosis not present

## 2018-10-28 LAB — CBC WITH DIFFERENTIAL (CANCER CENTER ONLY)
Abs Immature Granulocytes: 0.01 10*3/uL (ref 0.00–0.07)
Basophils Absolute: 0 10*3/uL (ref 0.0–0.1)
Basophils Relative: 0 %
Eosinophils Absolute: 0.1 10*3/uL (ref 0.0–0.5)
Eosinophils Relative: 1 %
HCT: 37 % (ref 36.0–46.0)
Hemoglobin: 11.6 g/dL — ABNORMAL LOW (ref 12.0–15.0)
Immature Granulocytes: 0 %
Lymphocytes Relative: 32 %
Lymphs Abs: 1.9 10*3/uL (ref 0.7–4.0)
MCH: 25.4 pg — ABNORMAL LOW (ref 26.0–34.0)
MCHC: 31.4 g/dL (ref 30.0–36.0)
MCV: 81.1 fL (ref 80.0–100.0)
Monocytes Absolute: 0.4 10*3/uL (ref 0.1–1.0)
Monocytes Relative: 7 %
Neutro Abs: 3.5 10*3/uL (ref 1.7–7.7)
Neutrophils Relative %: 60 %
Platelet Count: 142 10*3/uL — ABNORMAL LOW (ref 150–400)
RBC: 4.56 MIL/uL (ref 3.87–5.11)
RDW: 18.5 % — ABNORMAL HIGH (ref 11.5–15.5)
WBC Count: 5.8 10*3/uL (ref 4.0–10.5)
nRBC: 0 % (ref 0.0–0.2)

## 2018-10-28 LAB — CMP (CANCER CENTER ONLY)
ALT: 9 U/L (ref 0–44)
AST: 9 U/L — ABNORMAL LOW (ref 15–41)
Albumin: 3.5 g/dL (ref 3.5–5.0)
Alkaline Phosphatase: 116 U/L (ref 38–126)
Anion gap: 12 (ref 5–15)
BUN: 25 mg/dL — ABNORMAL HIGH (ref 6–20)
CO2: 25 mmol/L (ref 22–32)
Calcium: 8.8 mg/dL — ABNORMAL LOW (ref 8.9–10.3)
Chloride: 105 mmol/L (ref 98–111)
Creatinine: 1.42 mg/dL — ABNORMAL HIGH (ref 0.44–1.00)
GFR, Est AFR Am: 47 mL/min — ABNORMAL LOW (ref >60)
GFR, Estimated: 41 mL/min — ABNORMAL LOW (ref >60)
Glucose, Bld: 90 mg/dL (ref 70–99)
Potassium: 3.6 mmol/L (ref 3.5–5.1)
Sodium: 142 mmol/L (ref 135–145)
Total Bilirubin: 0.8 mg/dL (ref 0.3–1.2)
Total Protein: 8.2 g/dL — ABNORMAL HIGH (ref 6.5–8.1)

## 2018-10-28 MED ORDER — HEPARIN SOD (PORK) LOCK FLUSH 100 UNIT/ML IV SOLN
500.0000 [IU] | Freq: Once | INTRAVENOUS | Status: AC | PRN
  Administered 2018-10-28: 500 [IU]
  Filled 2018-10-28: qty 5

## 2018-10-28 MED ORDER — SODIUM CHLORIDE 0.9 % IV SOLN
Freq: Once | INTRAVENOUS | Status: AC
  Administered 2018-10-28: 12:00:00 via INTRAVENOUS
  Filled 2018-10-28: qty 5

## 2018-10-28 MED ORDER — IRINOTECAN HCL CHEMO INJECTION 100 MG/5ML
65.0000 mg/m2 | Freq: Once | INTRAVENOUS | Status: AC
  Administered 2018-10-28: 160 mg via INTRAVENOUS
  Filled 2018-10-28: qty 8

## 2018-10-28 MED ORDER — ATROPINE SULFATE 1 MG/ML IJ SOLN: 0.5000 mg | Freq: Once | INTRAMUSCULAR | Status: DC | PRN

## 2018-10-28 MED ORDER — POTASSIUM CHLORIDE 2 MEQ/ML IV SOLN
Freq: Once | INTRAVENOUS | Status: AC
  Administered 2018-10-28: 10:00:00 via INTRAVENOUS
  Filled 2018-10-28: qty 10

## 2018-10-28 MED ORDER — PALONOSETRON HCL INJECTION 0.25 MG/5ML
INTRAVENOUS | Status: AC
  Filled 2018-10-28: qty 5

## 2018-10-28 MED ORDER — SODIUM CHLORIDE 0.9% FLUSH
10.0000 mL | INTRAVENOUS | Status: DC | PRN
  Administered 2018-10-28: 10 mL
  Filled 2018-10-28: qty 10

## 2018-10-28 MED ORDER — SODIUM CHLORIDE 0.9 % IV SOLN
Freq: Once | INTRAVENOUS | Status: AC
  Administered 2018-10-28: 09:00:00 via INTRAVENOUS
  Filled 2018-10-28: qty 250

## 2018-10-28 MED ORDER — SODIUM CHLORIDE 0.9 % IV SOLN
30.0000 mg/m2 | Freq: Once | INTRAVENOUS | Status: AC
  Administered 2018-10-28: 15:00:00 74 mg via INTRAVENOUS
  Filled 2018-10-28: qty 74

## 2018-10-28 MED ORDER — ATROPINE SULFATE 1 MG/ML IJ SOLN
INTRAMUSCULAR | Status: AC
  Filled 2018-10-28: qty 1

## 2018-10-28 MED ORDER — PALONOSETRON HCL INJECTION 0.25 MG/5ML
0.2500 mg | Freq: Once | INTRAVENOUS | Status: AC
  Administered 2018-10-28: 12:00:00 0.25 mg via INTRAVENOUS

## 2018-10-28 NOTE — Patient Instructions (Signed)
[facility] Discharge Instructions for Patients Receiving Chemotherapy  Today you received the following chemotherapy agents:  Irinotecan, Cisplatin  To help prevent nausea and vomiting after your treatment, we encourage you to take your nausea medication as prescribed.   If you develop nausea and vomiting that is not controlled by your nausea medication, call the clinic.   BELOW ARE SYMPTOMS THAT SHOULD BE REPORTED IMMEDIATELY:  *FEVER GREATER THAN 100.5 F  *CHILLS WITH OR WITHOUT FEVER  NAUSEA AND VOMITING THAT IS NOT CONTROLLED WITH YOUR NAUSEA MEDICATION  *UNUSUAL SHORTNESS OF BREATH  *UNUSUAL BRUISING OR BLEEDING  TENDERNESS IN MOUTH AND THROAT WITH OR WITHOUT PRESENCE OF ULCERS  *URINARY PROBLEMS  *BOWEL PROBLEMS  UNUSUAL RASH Items with * indicate a potential emergency and should be followed up as soon as possible.  Feel free to call the clinic should you have any questions or concerns. The clinic phone number is [phone].  Please show the [facility] at check-in to the Emergency Department and triage nurse.  Irinotecan injection What is this medicine? IRINOTECAN (ir in oh TEE kan ) is a chemotherapy drug. It is used to treat colon and rectal cancer. This medicine may be used for other purposes; ask your health care provider or pharmacist if you have questions. COMMON BRAND NAME(S): Camptosar What should I tell my health care provider before I take this medicine? They need to know if you have any of these conditions:  dehydration  diarrhea  infection (especially a virus infection such as chickenpox, cold sores, or herpes)  liver disease  low blood counts, like low white cell, platelet, or red cell counts  low levels of calcium, magnesium, or potassium in the blood  recent or ongoing radiation therapy  an unusual or allergic reaction to irinotecan, other medicines, foods, dyes, or preservatives  pregnant or trying to get  pregnant  breast-feeding How should I use this medicine? This drug is given as an infusion into a vein. It is administered in a hospital or clinic by a specially trained health care professional. Talk to your pediatrician regarding the use of this medicine in children. Special care may be needed. Overdosage: If you think you have taken too much of this medicine contact a poison control center or emergency room at once. NOTE: This medicine is only for you. Do not share this medicine with others. What if I miss a dose? It is important not to miss your dose. Call your doctor or health care professional if you are unable to keep an appointment. What may interact with this medicine? This medicine may interact with the following medications:  antiviral medicines for HIV or AIDS  certain antibiotics like rifampin or rifabutin  certain medicines for fungal infections like itraconazole, ketoconazole, posaconazole, and voriconazole  certain medicines for seizures like carbamazepine, phenobarbital, phenotoin  clarithromycin  gemfibrozil  nefazodone  St. John's Wort This list may not describe all possible interactions. Give your health care provider a list of all the medicines, herbs, non-prescription drugs, or dietary supplements you use. Also tell them if you smoke, drink alcohol, or use illegal drugs. Some items may interact with your medicine. What should I watch for while using this medicine? Your condition will be monitored carefully while you are receiving this medicine. You will need important blood work done while you are taking this medicine. This drug may make you feel generally unwell. This is not uncommon, as chemotherapy can affect healthy cells as well as cancer  cells. Report any side effects. Continue your course of treatment even though you feel ill unless your doctor tells you to stop. In some cases, you may be given additional medicines to help with side effects. Follow all  directions for their use. You may get drowsy or dizzy. Do not drive, use machinery, or do anything that needs mental alertness until you know how this medicine affects you. Do not stand or sit up quickly, especially if you are an older patient. This reduces the risk of dizzy or fainting spells. Call your health care professional for advice if you get a fever, chills, or sore throat, or other symptoms of a cold or flu. Do not treat yourself. This medicine decreases your body's ability to fight infections. Try to avoid being around people who are sick. Avoid taking products that contain aspirin, acetaminophen, ibuprofen, naproxen, or ketoprofen unless instructed by your doctor. These medicines may hide a fever. This medicine may increase your risk to bruise or bleed. Call your doctor or health care professional if you notice any unusual bleeding. Be careful brushing and flossing your teeth or using a toothpick because you may get an infection or bleed more easily. If you have any dental work done, tell your dentist you are receiving this medicine. Do not become pregnant while taking this medicine or for 6 months after stopping it. Women should inform their health care professional if they wish to become pregnant or think they might be pregnant. Men should not father a child while taking this medicine and for 3 months after stopping it. There is potential for serious side effects to an unborn child. Talk to your health care professional for more information. Do not breast-feed an infant while taking this medicine or for 7 days after stopping it. This medicine has caused ovarian failure in some women. This medicine may make it more difficult to get pregnant. Talk to your health care professional if you are concerned about your fertility. This medicine has caused decreased sperm counts in some men. This may make it more difficult to father a child. Talk to your health care professional if you are concerned about  your fertility. What side effects may I notice from receiving this medicine? Side effects that you should report to your doctor or health care professional as soon as possible:  allergic reactions like skin rash, itching or hives, swelling of the face, lips, or tongue  chest pain  diarrhea  flushing, runny nose, sweating during infusion  low blood counts - this medicine may decrease the number of white blood cells, red blood cells and platelets. You may be at increased risk for infections and bleeding.  nausea, vomiting  pain, swelling, warmth in the leg  signs of decreased platelets or bleeding - bruising, pinpoint red spots on the skin, black, tarry stools, blood in the urine  signs of infection - fever or chills, cough, sore throat, pain or difficulty passing urine  signs of decreased red blood cells - unusually weak or tired, fainting spells, lightheadedness Side effects that usually do not require medical attention (report to your doctor or health care professional if they continue or are bothersome):  constipation  hair loss  headache  loss of appetite  mouth sores  stomach pain This list may not describe all possible side effects. Call your doctor for medical advice about side effects. You may report side effects to FDA at 1-800-FDA-1088. Where should I keep my medicine? This drug is given in a hospital  or clinic and will not be stored at home. NOTE: This sheet is a summary. It may not cover all possible information. If you have questions about this medicine, talk to your doctor, pharmacist, or health care provider.  2020 Elsevier/Gold Standard (2018-05-24 10:09:17)  Cisplatin injection What is this medicine? CISPLATIN (SIS pla tin) is a chemotherapy drug. It targets fast dividing cells, like cancer cells, and causes these cells to die. This medicine is used to treat many types of cancer like bladder, ovarian, and testicular cancers. This medicine may be used for  other purposes; ask your health care provider or pharmacist if you have questions. COMMON BRAND NAME(S): Platinol, Platinol -AQ What should I tell my health care provider before I take this medicine? They need to know if you have any of these conditions:  blood disorders  hearing problems  kidney disease  recent or ongoing radiation therapy  an unusual or allergic reaction to cisplatin, carboplatin, other chemotherapy, other medicines, foods, dyes, or preservatives  pregnant or trying to get pregnant  breast-feeding How should I use this medicine? This drug is given as an infusion into a vein. It is administered in a hospital or clinic by a specially trained health care professional. Talk to your pediatrician regarding the use of this medicine in children. Special care may be needed. Overdosage: If you think you have taken too much of this medicine contact a poison control center or emergency room at once. NOTE: This medicine is only for you. Do not share this medicine with others. What if I miss a dose? It is important not to miss a dose. Call your doctor or health care professional if you are unable to keep an appointment. What may interact with this medicine?  dofetilide  foscarnet  medicines for seizures  medicines to increase blood counts like filgrastim, pegfilgrastim, sargramostim  probenecid  pyridoxine used with altretamine  rituximab  some antibiotics like amikacin, gentamicin, neomycin, polymyxin B, streptomycin, tobramycin  sulfinpyrazone  vaccines  zalcitabine Talk to your doctor or health care professional before taking any of these medicines:  acetaminophen  aspirin  ibuprofen  ketoprofen  naproxen This list may not describe all possible interactions. Give your health care provider a list of all the medicines, herbs, non-prescription drugs, or dietary supplements you use. Also tell them if you smoke, drink alcohol, or use illegal drugs. Some  items may interact with your medicine. What should I watch for while using this medicine? Your condition will be monitored carefully while you are receiving this medicine. You will need important blood work done while you are taking this medicine. This drug may make you feel generally unwell. This is not uncommon, as chemotherapy can affect healthy cells as well as cancer cells. Report any side effects. Continue your course of treatment even though you feel ill unless your doctor tells you to stop. In some cases, you may be given additional medicines to help with side effects. Follow all directions for their use. Call your doctor or health care professional for advice if you get a fever, chills or sore throat, or other symptoms of a cold or flu. Do not treat yourself. This drug decreases your body's ability to fight infections. Try to avoid being around people who are sick. This medicine may increase your risk to bruise or bleed. Call your doctor or health care professional if you notice any unusual bleeding. Be careful brushing and flossing your teeth or using a toothpick because you may get an infection  or bleed more easily. If you have any dental work done, tell your dentist you are receiving this medicine. Avoid taking products that contain aspirin, acetaminophen, ibuprofen, naproxen, or ketoprofen unless instructed by your doctor. These medicines may hide a fever. Do not become pregnant while taking this medicine. Women should inform their doctor if they wish to become pregnant or think they might be pregnant. There is a potential for serious side effects to an unborn child. Talk to your health care professional or pharmacist for more information. Do not breast-feed an infant while taking this medicine. Drink fluids as directed while you are taking this medicine. This will help protect your kidneys. Call your doctor or health care professional if you get diarrhea. Do not treat yourself. What side  effects may I notice from receiving this medicine? Side effects that you should report to your doctor or health care professional as soon as possible:  allergic reactions like skin rash, itching or hives, swelling of the face, lips, or tongue  signs of infection - fever or chills, cough, sore throat, pain or difficulty passing urine  signs of decreased platelets or bleeding - bruising, pinpoint red spots on the skin, black, tarry stools, nosebleeds  signs of decreased red blood cells - unusually weak or tired, fainting spells, lightheadedness  breathing problems  changes in hearing  gout pain  low blood counts - This drug may decrease the number of white blood cells, red blood cells and platelets. You may be at increased risk for infections and bleeding.  nausea and vomiting  pain, swelling, redness or irritation at the injection site  pain, tingling, numbness in the hands or feet  problems with balance, movement  trouble passing urine or change in the amount of urine Side effects that usually do not require medical attention (report to your doctor or health care professional if they continue or are bothersome):  changes in vision  loss of appetite  metallic taste in the mouth or changes in taste This list may not describe all possible side effects. Call your doctor for medical advice about side effects. You may report side effects to FDA at 1-800-FDA-1088. Where should I keep my medicine? This drug is given in a hospital or clinic and will not be stored at home. NOTE: This sheet is a summary. It may not cover all possible information. If you have questions about this medicine, talk to your doctor, pharmacist, or health care provider.  2020 Elsevier/Gold Standard (2007-07-09 14:40:54)  Coronavirus (COVID-19) Are you at risk?  Are you at risk for the Coronavirus (COVID-19)?  To be considered HIGH RISK for Coronavirus (COVID-19), you have to meet the following  criteria:  . Traveled to [place], [place], [place], [place] or [place]; or in the [place] to [place], [place], [place], or [place]; and have fever, cough, and shortness of breath within the last 2 weeks of travel OR . Been in close contact with a person diagnosed with COVID-19 within the last 2 weeks and have fever, cough, and shortness of breath . IF YOU DO NOT MEET THESE CRITERIA, YOU ARE CONSIDERED LOW RISK FOR COVID-19.  What to do if you are HIGH RISK for COVID-19?  Marland Kitchen If you are having a medical emergency, call 911. . Seek medical care right away. Before you go to a doctor's office, urgent care or emergency department, call ahead and tell them about your recent travel, contact with someone diagnosed with COVID-19, and your symptoms. You should  receive instructions from your physician's office regarding next steps of care.  . When you arrive at healthcare provider, tell the healthcare staff immediately you have returned from visiting [place], [place], [place], [place] or [place]; or traveled in the [place] to [place], [place], [place], or [place]; in the last two weeks or you have been in close contact with a person diagnosed with COVID-19 in the last 2 weeks.   . Tell the health care staff about your symptoms: fever, cough and shortness of breath. . After you have been seen by a medical provider, you will be either: o Tested for (COVID-19) and discharged home on quarantine except to seek medical care if symptoms worsen, and asked to  - Stay home and avoid contact with others until you get your results (4-5 days)  - Avoid travel on public transportation if possible (such as bus, train, or airplane) or o Sent to the Emergency Department by EMS for evaluation, COVID-19 testing, and possible admission depending on your condition and test results.  What to do if you are LOW RISK for COVID-19?  Reduce your risk of any infection by using the same precautions used for  avoiding the common cold or flu:  Marland Kitchen Wash your hands often with soap and warm water for at least 20 seconds.  If soap and water are not readily available, use an alcohol-based hand sanitizer with at least 60% alcohol.  . If coughing or sneezing, cover your mouth and nose by coughing or sneezing into the elbow areas of your shirt or coat, into a tissue or into your sleeve (not your hands). . Avoid shaking hands with others and consider head nods or verbal greetings only. . Avoid touching your eyes, nose, or mouth with unwashed hands.  . Avoid close contact with people who are sick. . Avoid places or events with large numbers of people in one location, like concerts or sporting events. . Carefully consider travel plans you have or are making. . If you are planning any travel outside or inside the [place], visit the CDC's Travelers' Health webpage for the latest health notices. . If you have some symptoms but not all symptoms, continue to monitor at home and seek medical attention if your symptoms worsen. . If you are having a medical emergency, call 911.   [facility] / e-Visit: [email or web address]         [facility]: [facility]: [phone]                   [facility]: [phone]

## 2018-10-29 ENCOUNTER — Telehealth: Payer: Self-pay | Admitting: Internal Medicine

## 2018-10-29 NOTE — Telephone Encounter (Signed)
R/s appt per 7/13 sch message - unable to reach pt daughter - left message with appt date and time

## 2018-10-30 ENCOUNTER — Telehealth: Payer: Self-pay | Admitting: *Deleted

## 2018-10-30 NOTE — Telephone Encounter (Signed)
-----   Message from Paulla Dolly, RN sent at 10/29/2018  9:03 AM EDT ----- Regarding: Samantha Santos 1st chem D1 C1 cyclophosphamide + irinotecan on 10/28/18. Pt tolerated well

## 2018-10-30 NOTE — Telephone Encounter (Signed)
[facility] EMR number [phone].  Redialed to verify correct number for voicemail identified as 'Carlyle".  Name listed daughter with ROI also an emergency contact.  Message left requesting a return call.  Awaiting return call from patient for chemotherapy follow up.

## 2018-11-04 ENCOUNTER — Other Ambulatory Visit: Payer: Medicaid Other

## 2018-11-04 ENCOUNTER — Ambulatory Visit: Payer: Medicaid Other

## 2018-11-05 ENCOUNTER — Inpatient Hospital Stay: Payer: Medicaid Other

## 2018-11-05 ENCOUNTER — Other Ambulatory Visit: Payer: Self-pay

## 2018-11-05 VITALS — BP 98/81 | HR 98 | Temp 98.5°F | Resp 18

## 2018-11-05 DIAGNOSIS — C3412 Malignant neoplasm of upper lobe, left bronchus or lung: Secondary | ICD-10-CM

## 2018-11-05 DIAGNOSIS — Z5111 Encounter for antineoplastic chemotherapy: Secondary | ICD-10-CM | POA: Diagnosis not present

## 2018-11-05 LAB — CMP (CANCER CENTER ONLY)
ALT: 9 U/L (ref 0–44)
AST: 7 U/L — ABNORMAL LOW (ref 15–41)
Albumin: 3.2 g/dL — ABNORMAL LOW (ref 3.5–5.0)
Alkaline Phosphatase: 103 U/L (ref 38–126)
Anion gap: 11 (ref 5–15)
BUN: 19 mg/dL (ref 6–20)
CO2: 28 mmol/L (ref 22–32)
Calcium: 8.9 mg/dL (ref 8.9–10.3)
Chloride: 102 mmol/L (ref 98–111)
Creatinine: 1.27 mg/dL — ABNORMAL HIGH (ref 0.44–1.00)
GFR, Est AFR Am: 54 mL/min — ABNORMAL LOW (ref >60)
GFR, Estimated: 47 mL/min — ABNORMAL LOW (ref >60)
Glucose, Bld: 116 mg/dL — ABNORMAL HIGH (ref 70–99)
Potassium: 3.7 mmol/L (ref 3.5–5.1)
Sodium: 141 mmol/L (ref 135–145)
Total Bilirubin: 0.5 mg/dL (ref 0.3–1.2)
Total Protein: 7.2 g/dL (ref 6.5–8.1)

## 2018-11-05 LAB — CBC WITH DIFFERENTIAL (CANCER CENTER ONLY)
Abs Immature Granulocytes: 0.02 10*3/uL (ref 0.00–0.07)
Basophils Absolute: 0 10*3/uL (ref 0.0–0.1)
Basophils Relative: 0 %
Eosinophils Absolute: 0 10*3/uL (ref 0.0–0.5)
Eosinophils Relative: 1 %
HCT: 33.2 % — ABNORMAL LOW (ref 36.0–46.0)
Hemoglobin: 10.6 g/dL — ABNORMAL LOW (ref 12.0–15.0)
Immature Granulocytes: 1 %
Lymphocytes Relative: 53 %
Lymphs Abs: 1.6 10*3/uL (ref 0.7–4.0)
MCH: 25.6 pg — ABNORMAL LOW (ref 26.0–34.0)
MCHC: 31.9 g/dL (ref 30.0–36.0)
MCV: 80.2 fL (ref 80.0–100.0)
Monocytes Absolute: 0.2 10*3/uL (ref 0.1–1.0)
Monocytes Relative: 5 %
Neutro Abs: 1.2 10*3/uL — ABNORMAL LOW (ref 1.7–7.7)
Neutrophils Relative %: 40 %
Platelet Count: 154 10*3/uL (ref 150–400)
RBC: 4.14 MIL/uL (ref 3.87–5.11)
RDW: 18 % — ABNORMAL HIGH (ref 11.5–15.5)
WBC Count: 3.1 10*3/uL — ABNORMAL LOW (ref 4.0–10.5)
nRBC: 0 % (ref 0.0–0.2)

## 2018-11-05 LAB — MAGNESIUM: Magnesium: 1.9 mg/dL (ref 1.7–2.4)

## 2018-11-05 MED ORDER — PALONOSETRON HCL INJECTION 0.25 MG/5ML
0.2500 mg | Freq: Once | INTRAVENOUS | Status: AC
  Administered 2018-11-05: 0.25 mg via INTRAVENOUS

## 2018-11-05 MED ORDER — SODIUM CHLORIDE 0.9 % IV SOLN
30.0000 mg/m2 | Freq: Once | INTRAVENOUS | Status: AC
  Administered 2018-11-05: 14:00:00 74 mg via INTRAVENOUS
  Filled 2018-11-05: qty 74

## 2018-11-05 MED ORDER — SODIUM CHLORIDE 0.9 % IV SOLN
Freq: Once | INTRAVENOUS | Status: AC
  Administered 2018-11-05: 09:00:00 via INTRAVENOUS
  Filled 2018-11-05: qty 250

## 2018-11-05 MED ORDER — PALONOSETRON HCL INJECTION 0.25 MG/5ML
INTRAVENOUS | Status: AC
  Filled 2018-11-05: qty 5

## 2018-11-05 MED ORDER — HEPARIN SOD (PORK) LOCK FLUSH 100 UNIT/ML IV SOLN
500.0000 [IU] | Freq: Once | INTRAVENOUS | Status: AC | PRN
  Administered 2018-11-05: 17:00:00 500 [IU]
  Filled 2018-11-05: qty 5

## 2018-11-05 MED ORDER — IRINOTECAN HCL CHEMO INJECTION 100 MG/5ML
65.0000 mg/m2 | Freq: Once | INTRAVENOUS | Status: AC
  Administered 2018-11-05: 160 mg via INTRAVENOUS
  Filled 2018-11-05: qty 8

## 2018-11-05 MED ORDER — ATROPINE SULFATE 1 MG/ML IJ SOLN: 0.5000 mg | Freq: Once | INTRAMUSCULAR | Status: DC | PRN

## 2018-11-05 MED ORDER — SODIUM CHLORIDE 0.9 % IV SOLN
Freq: Once | INTRAVENOUS | Status: AC
  Administered 2018-11-05: 11:00:00 via INTRAVENOUS
  Filled 2018-11-05: qty 5

## 2018-11-05 MED ORDER — SODIUM CHLORIDE 0.9% FLUSH
10.0000 mL | INTRAVENOUS | Status: DC | PRN
  Administered 2018-11-05: 17:00:00 10 mL
  Filled 2018-11-05: qty 10

## 2018-11-05 MED ORDER — POTASSIUM CHLORIDE 2 MEQ/ML IV SOLN
Freq: Once | INTRAVENOUS | Status: AC
  Administered 2018-11-05: 09:00:00 via INTRAVENOUS
  Filled 2018-11-05: qty 10

## 2018-11-05 NOTE — Progress Notes (Signed)
V.O. Ok to treat per Dr. Julien Nordmann with  ANC 1.2

## 2018-11-05 NOTE — Patient Instructions (Signed)
[facility] Discharge Instructions for Patients Receiving Chemotherapy  Today you received the following chemotherapy agents:  Irinotecan, Cisplatin  To help prevent nausea and vomiting after your treatment, we encourage you to take your nausea medication as prescribed.   If you develop nausea and vomiting that is not controlled by your nausea medication, call the clinic.   BELOW ARE SYMPTOMS THAT SHOULD BE REPORTED IMMEDIATELY:  *FEVER GREATER THAN 100.5 F  *CHILLS WITH OR WITHOUT FEVER  NAUSEA AND VOMITING THAT IS NOT CONTROLLED WITH YOUR NAUSEA MEDICATION  *UNUSUAL SHORTNESS OF BREATH  *UNUSUAL BRUISING OR BLEEDING  TENDERNESS IN MOUTH AND THROAT WITH OR WITHOUT PRESENCE OF ULCERS  *URINARY PROBLEMS  *BOWEL PROBLEMS  UNUSUAL RASH Items with * indicate a potential emergency and should be followed up as soon as possible.  Feel free to call the clinic should you have any questions or concerns. The clinic phone number is [phone].  Please show the [facility] at check-in to the Emergency Department and triage nurse.  Irinotecan injection What is this medicine? IRINOTECAN (ir in oh TEE kan ) is a chemotherapy drug. It is used to treat colon and rectal cancer. This medicine may be used for other purposes; ask your health care provider or pharmacist if you have questions. COMMON BRAND NAME(S): Camptosar What should I tell my health care provider before I take this medicine? They need to know if you have any of these conditions:  dehydration  diarrhea  infection (especially a virus infection such as chickenpox, cold sores, or herpes)  liver disease  low blood counts, like low white cell, platelet, or red cell counts  low levels of calcium, magnesium, or potassium in the blood  recent or ongoing radiation therapy  an unusual or allergic reaction to irinotecan, other medicines, foods, dyes, or preservatives  pregnant or trying to get  pregnant  breast-feeding How should I use this medicine? This drug is given as an infusion into a vein. It is administered in a hospital or clinic by a specially trained health care professional. Talk to your pediatrician regarding the use of this medicine in children. Special care may be needed. Overdosage: If you think you have taken too much of this medicine contact a poison control center or emergency room at once. NOTE: This medicine is only for you. Do not share this medicine with others. What if I miss a dose? It is important not to miss your dose. Call your doctor or health care professional if you are unable to keep an appointment. What may interact with this medicine? This medicine may interact with the following medications:  antiviral medicines for HIV or AIDS  certain antibiotics like rifampin or rifabutin  certain medicines for fungal infections like itraconazole, ketoconazole, posaconazole, and voriconazole  certain medicines for seizures like carbamazepine, phenobarbital, phenotoin  clarithromycin  gemfibrozil  nefazodone  St. John's Wort This list may not describe all possible interactions. Give your health care provider a list of all the medicines, herbs, non-prescription drugs, or dietary supplements you use. Also tell them if you smoke, drink alcohol, or use illegal drugs. Some items may interact with your medicine. What should I watch for while using this medicine? Your condition will be monitored carefully while you are receiving this medicine. You will need important blood work done while you are taking this medicine. This drug may make you feel generally unwell. This is not uncommon, as chemotherapy can affect healthy cells as well as cancer  cells. Report any side effects. Continue your course of treatment even though you feel ill unless your doctor tells you to stop. In some cases, you may be given additional medicines to help with side effects. Follow all  directions for their use. You may get drowsy or dizzy. Do not drive, use machinery, or do anything that needs mental alertness until you know how this medicine affects you. Do not stand or sit up quickly, especially if you are an older patient. This reduces the risk of dizzy or fainting spells. Call your health care professional for advice if you get a fever, chills, or sore throat, or other symptoms of a cold or flu. Do not treat yourself. This medicine decreases your body's ability to fight infections. Try to avoid being around people who are sick. Avoid taking products that contain aspirin, acetaminophen, ibuprofen, naproxen, or ketoprofen unless instructed by your doctor. These medicines may hide a fever. This medicine may increase your risk to bruise or bleed. Call your doctor or health care professional if you notice any unusual bleeding. Be careful brushing and flossing your teeth or using a toothpick because you may get an infection or bleed more easily. If you have any dental work done, tell your dentist you are receiving this medicine. Do not become pregnant while taking this medicine or for 6 months after stopping it. Women should inform their health care professional if they wish to become pregnant or think they might be pregnant. Men should not father a child while taking this medicine and for 3 months after stopping it. There is potential for serious side effects to an unborn child. Talk to your health care professional for more information. Do not breast-feed an infant while taking this medicine or for 7 days after stopping it. This medicine has caused ovarian failure in some women. This medicine may make it more difficult to get pregnant. Talk to your health care professional if you are concerned about your fertility. This medicine has caused decreased sperm counts in some men. This may make it more difficult to father a child. Talk to your health care professional if you are concerned about  your fertility. What side effects may I notice from receiving this medicine? Side effects that you should report to your doctor or health care professional as soon as possible:  allergic reactions like skin rash, itching or hives, swelling of the face, lips, or tongue  chest pain  diarrhea  flushing, runny nose, sweating during infusion  low blood counts - this medicine may decrease the number of white blood cells, red blood cells and platelets. You may be at increased risk for infections and bleeding.  nausea, vomiting  pain, swelling, warmth in the leg  signs of decreased platelets or bleeding - bruising, pinpoint red spots on the skin, black, tarry stools, blood in the urine  signs of infection - fever or chills, cough, sore throat, pain or difficulty passing urine  signs of decreased red blood cells - unusually weak or tired, fainting spells, lightheadedness Side effects that usually do not require medical attention (report to your doctor or health care professional if they continue or are bothersome):  constipation  hair loss  headache  loss of appetite  mouth sores  stomach pain This list may not describe all possible side effects. Call your doctor for medical advice about side effects. You may report side effects to FDA at 1-800-FDA-1088. Where should I keep my medicine? This drug is given in a hospital  or clinic and will not be stored at home. NOTE: This sheet is a summary. It may not cover all possible information. If you have questions about this medicine, talk to your doctor, pharmacist, or health care provider.  2020 Elsevier/Gold Standard (2018-05-24 10:09:17)  Cisplatin injection What is this medicine? CISPLATIN (SIS pla tin) is a chemotherapy drug. It targets fast dividing cells, like cancer cells, and causes these cells to die. This medicine is used to treat many types of cancer like bladder, ovarian, and testicular cancers. This medicine may be used for  other purposes; ask your health care provider or pharmacist if you have questions. COMMON BRAND NAME(S): Platinol, Platinol -AQ What should I tell my health care provider before I take this medicine? They need to know if you have any of these conditions:  blood disorders  hearing problems  kidney disease  recent or ongoing radiation therapy  an unusual or allergic reaction to cisplatin, carboplatin, other chemotherapy, other medicines, foods, dyes, or preservatives  pregnant or trying to get pregnant  breast-feeding How should I use this medicine? This drug is given as an infusion into a vein. It is administered in a hospital or clinic by a specially trained health care professional. Talk to your pediatrician regarding the use of this medicine in children. Special care may be needed. Overdosage: If you think you have taken too much of this medicine contact a poison control center or emergency room at once. NOTE: This medicine is only for you. Do not share this medicine with others. What if I miss a dose? It is important not to miss a dose. Call your doctor or health care professional if you are unable to keep an appointment. What may interact with this medicine?  dofetilide  foscarnet  medicines for seizures  medicines to increase blood counts like filgrastim, pegfilgrastim, sargramostim  probenecid  pyridoxine used with altretamine  rituximab  some antibiotics like amikacin, gentamicin, neomycin, polymyxin B, streptomycin, tobramycin  sulfinpyrazone  vaccines  zalcitabine Talk to your doctor or health care professional before taking any of these medicines:  acetaminophen  aspirin  ibuprofen  ketoprofen  naproxen This list may not describe all possible interactions. Give your health care provider a list of all the medicines, herbs, non-prescription drugs, or dietary supplements you use. Also tell them if you smoke, drink alcohol, or use illegal drugs. Some  items may interact with your medicine. What should I watch for while using this medicine? Your condition will be monitored carefully while you are receiving this medicine. You will need important blood work done while you are taking this medicine. This drug may make you feel generally unwell. This is not uncommon, as chemotherapy can affect healthy cells as well as cancer cells. Report any side effects. Continue your course of treatment even though you feel ill unless your doctor tells you to stop. In some cases, you may be given additional medicines to help with side effects. Follow all directions for their use. Call your doctor or health care professional for advice if you get a fever, chills or sore throat, or other symptoms of a cold or flu. Do not treat yourself. This drug decreases your body's ability to fight infections. Try to avoid being around people who are sick. This medicine may increase your risk to bruise or bleed. Call your doctor or health care professional if you notice any unusual bleeding. Be careful brushing and flossing your teeth or using a toothpick because you may get an infection  or bleed more easily. If you have any dental work done, tell your dentist you are receiving this medicine. Avoid taking products that contain aspirin, acetaminophen, ibuprofen, naproxen, or ketoprofen unless instructed by your doctor. These medicines may hide a fever. Do not become pregnant while taking this medicine. Women should inform their doctor if they wish to become pregnant or think they might be pregnant. There is a potential for serious side effects to an unborn child. Talk to your health care professional or pharmacist for more information. Do not breast-feed an infant while taking this medicine. Drink fluids as directed while you are taking this medicine. This will help protect your kidneys. Call your doctor or health care professional if you get diarrhea. Do not treat yourself. What side  effects may I notice from receiving this medicine? Side effects that you should report to your doctor or health care professional as soon as possible:  allergic reactions like skin rash, itching or hives, swelling of the face, lips, or tongue  signs of infection - fever or chills, cough, sore throat, pain or difficulty passing urine  signs of decreased platelets or bleeding - bruising, pinpoint red spots on the skin, black, tarry stools, nosebleeds  signs of decreased red blood cells - unusually weak or tired, fainting spells, lightheadedness  breathing problems  changes in hearing  gout pain  low blood counts - This drug may decrease the number of white blood cells, red blood cells and platelets. You may be at increased risk for infections and bleeding.  nausea and vomiting  pain, swelling, redness or irritation at the injection site  pain, tingling, numbness in the hands or feet  problems with balance, movement  trouble passing urine or change in the amount of urine Side effects that usually do not require medical attention (report to your doctor or health care professional if they continue or are bothersome):  changes in vision  loss of appetite  metallic taste in the mouth or changes in taste This list may not describe all possible side effects. Call your doctor for medical advice about side effects. You may report side effects to FDA at 1-800-FDA-1088. Where should I keep my medicine? This drug is given in a hospital or clinic and will not be stored at home. NOTE: This sheet is a summary. It may not cover all possible information. If you have questions about this medicine, talk to your doctor, pharmacist, or health care provider.  2020 Elsevier/Gold Standard (2007-07-09 14:40:54)  Coronavirus (COVID-19) Are you at risk?  Are you at risk for the Coronavirus (COVID-19)?  To be considered HIGH RISK for Coronavirus (COVID-19), you have to meet the following  criteria:  . Traveled to [place], [place], [place], [place] or [place]; or in the [place] to [place], [place], [place], or [place]; and have fever, cough, and shortness of breath within the last 2 weeks of travel OR . Been in close contact with a person diagnosed with COVID-19 within the last 2 weeks and have fever, cough, and shortness of breath . IF YOU DO NOT MEET THESE CRITERIA, YOU ARE CONSIDERED LOW RISK FOR COVID-19.  What to do if you are HIGH RISK for COVID-19?  Marland Kitchen If you are having a medical emergency, call 911. . Seek medical care right away. Before you go to a doctor's office, urgent care or emergency department, call ahead and tell them about your recent travel, contact with someone diagnosed with COVID-19, and your symptoms. You should  receive instructions from your physician's office regarding next steps of care.  . When you arrive at healthcare provider, tell the healthcare staff immediately you have returned from visiting [place], [place], [place], [place] or [place]; or traveled in the [place] to [place], [place], [place], or [place]; in the last two weeks or you have been in close contact with a person diagnosed with COVID-19 in the last 2 weeks.   . Tell the health care staff about your symptoms: fever, cough and shortness of breath. . After you have been seen by a medical provider, you will be either: o Tested for (COVID-19) and discharged home on quarantine except to seek medical care if symptoms worsen, and asked to  - Stay home and avoid contact with others until you get your results (4-5 days)  - Avoid travel on public transportation if possible (such as bus, train, or airplane) or o Sent to the Emergency Department by EMS for evaluation, COVID-19 testing, and possible admission depending on your condition and test results.  What to do if you are LOW RISK for COVID-19?  Reduce your risk of any infection by using the same precautions used for  avoiding the common cold or flu:  Marland Kitchen Wash your hands often with soap and warm water for at least 20 seconds.  If soap and water are not readily available, use an alcohol-based hand sanitizer with at least 60% alcohol.  . If coughing or sneezing, cover your mouth and nose by coughing or sneezing into the elbow areas of your shirt or coat, into a tissue or into your sleeve (not your hands). . Avoid shaking hands with others and consider head nods or verbal greetings only. . Avoid touching your eyes, nose, or mouth with unwashed hands.  . Avoid close contact with people who are sick. . Avoid places or events with large numbers of people in one location, like concerts or sporting events. . Carefully consider travel plans you have or are making. . If you are planning any travel outside or inside the [place], visit the CDC's Travelers' Health webpage for the latest health notices. . If you have some symptoms but not all symptoms, continue to monitor at home and seek medical attention if your symptoms worsen. . If you are having a medical emergency, call 911.   [facility] / e-Visit: [email or web address]         [facility]: [facility]: [phone]                   [facility]: [phone]

## 2018-11-05 NOTE — Progress Notes (Signed)
Dr. Julien Nordmann notified of tip placement of Port-a-cath in RA near Tricuspid valve. OK to proceed with treatment using port with tip in current location.

## 2018-11-06 ENCOUNTER — Inpatient Hospital Stay: Payer: Medicaid Other

## 2018-11-06 ENCOUNTER — Inpatient Hospital Stay: Payer: Medicaid Other | Admitting: Medical

## 2018-11-06 ENCOUNTER — Telehealth: Payer: Self-pay | Admitting: Medical Oncology

## 2018-11-06 ENCOUNTER — Other Ambulatory Visit: Payer: Self-pay

## 2018-11-06 ENCOUNTER — Inpatient Hospital Stay (HOSPITAL_BASED_OUTPATIENT_CLINIC_OR_DEPARTMENT_OTHER): Payer: Medicaid Other | Admitting: Medical

## 2018-11-06 VITALS — BP 136/91 | HR 78 | Temp 98.5°F | Resp 18 | Ht 67.0 in | Wt 282.4 lb

## 2018-11-06 DIAGNOSIS — C3412 Malignant neoplasm of upper lobe, left bronchus or lung: Secondary | ICD-10-CM

## 2018-11-06 DIAGNOSIS — Z95828 Presence of other vascular implants and grafts: Secondary | ICD-10-CM

## 2018-11-06 DIAGNOSIS — Z5111 Encounter for antineoplastic chemotherapy: Secondary | ICD-10-CM | POA: Diagnosis not present

## 2018-11-06 DIAGNOSIS — C7A1 Malignant poorly differentiated neuroendocrine tumors: Secondary | ICD-10-CM | POA: Diagnosis not present

## 2018-11-06 DIAGNOSIS — E860 Dehydration: Secondary | ICD-10-CM | POA: Diagnosis not present

## 2018-11-06 DIAGNOSIS — R112 Nausea with vomiting, unspecified: Secondary | ICD-10-CM

## 2018-11-06 DIAGNOSIS — R197 Diarrhea, unspecified: Secondary | ICD-10-CM

## 2018-11-06 LAB — CBC WITH DIFFERENTIAL (CANCER CENTER ONLY)
Abs Immature Granulocytes: 0.01 10*3/uL (ref 0.00–0.07)
Basophils Absolute: 0 10*3/uL (ref 0.0–0.1)
Basophils Relative: 0 %
Eosinophils Absolute: 0 10*3/uL (ref 0.0–0.5)
Eosinophils Relative: 0 %
HCT: 33.2 % — ABNORMAL LOW (ref 36.0–46.0)
Hemoglobin: 10.6 g/dL — ABNORMAL LOW (ref 12.0–15.0)
Immature Granulocytes: 0 %
Lymphocytes Relative: 10 %
Lymphs Abs: 0.5 10*3/uL — ABNORMAL LOW (ref 0.7–4.0)
MCH: 25.5 pg — ABNORMAL LOW (ref 26.0–34.0)
MCHC: 31.9 g/dL (ref 30.0–36.0)
MCV: 79.8 fL — ABNORMAL LOW (ref 80.0–100.0)
Monocytes Absolute: 0.4 10*3/uL (ref 0.1–1.0)
Monocytes Relative: 8 %
Neutro Abs: 3.8 10*3/uL (ref 1.7–7.7)
Neutrophils Relative %: 82 %
Platelet Count: 174 10*3/uL (ref 150–400)
RBC: 4.16 MIL/uL (ref 3.87–5.11)
RDW: 18.2 % — ABNORMAL HIGH (ref 11.5–15.5)
WBC Count: 4.7 10*3/uL (ref 4.0–10.5)
nRBC: 0 % (ref 0.0–0.2)

## 2018-11-06 LAB — CMP (CANCER CENTER ONLY)
ALT: 12 U/L (ref 0–44)
AST: 7 U/L — ABNORMAL LOW (ref 15–41)
Albumin: 3.3 g/dL — ABNORMAL LOW (ref 3.5–5.0)
Alkaline Phosphatase: 105 U/L (ref 38–126)
Anion gap: 9 (ref 5–15)
BUN: 32 mg/dL — ABNORMAL HIGH (ref 6–20)
CO2: 23 mmol/L (ref 22–32)
Calcium: 8.7 mg/dL — ABNORMAL LOW (ref 8.9–10.3)
Chloride: 106 mmol/L (ref 98–111)
Creatinine: 1.28 mg/dL — ABNORMAL HIGH (ref 0.44–1.00)
GFR, Est AFR Am: 54 mL/min — ABNORMAL LOW (ref >60)
GFR, Estimated: 46 mL/min — ABNORMAL LOW (ref >60)
Glucose, Bld: 126 mg/dL — ABNORMAL HIGH (ref 70–99)
Potassium: 4.3 mmol/L (ref 3.5–5.1)
Sodium: 138 mmol/L (ref 135–145)
Total Bilirubin: 0.4 mg/dL (ref 0.3–1.2)
Total Protein: 7.6 g/dL (ref 6.5–8.1)

## 2018-11-06 LAB — SAMPLE TO BLOOD BANK

## 2018-11-06 LAB — MAGNESIUM: Magnesium: 2.3 mg/dL (ref 1.7–2.4)

## 2018-11-06 MED ORDER — DIPHENOXYLATE-ATROPINE 2.5-0.025 MG PO TABS: ORAL_TABLET | ORAL | 1 refills | Status: AC

## 2018-11-06 MED ORDER — LORAZEPAM 0.5 MG PO TABS: 0.5000 mg | ORAL_TABLET | Freq: Four times a day (QID) | ORAL | 1 refills | Status: AC | PRN

## 2018-11-06 MED ORDER — LORAZEPAM 1 MG PO TABS
ORAL_TABLET | ORAL | Status: AC
  Filled 2018-11-06: qty 1

## 2018-11-06 MED ORDER — HEPARIN SOD (PORK) LOCK FLUSH 100 UNIT/ML IV SOLN
500.0000 [IU] | Freq: Once | INTRAVENOUS | Status: AC | PRN
  Administered 2018-11-06: 17:00:00 500 [IU]
  Filled 2018-11-06: qty 5

## 2018-11-06 MED ORDER — LORAZEPAM 1 MG PO TABS
0.5000 mg | ORAL_TABLET | Freq: Once | ORAL | Status: AC
  Administered 2018-11-06: 15:00:00 0.5 mg via SUBLINGUAL

## 2018-11-06 MED ORDER — PROCHLORPERAZINE EDISYLATE 10 MG/2ML IJ SOLN
10.0000 mg | Freq: Once | INTRAMUSCULAR | Status: AC
  Administered 2018-11-06: 15:00:00 10 mg via INTRAVENOUS
  Filled 2018-11-06: qty 2

## 2018-11-06 MED ORDER — SODIUM CHLORIDE 0.9% FLUSH
10.0000 mL | INTRAVENOUS | Status: DC | PRN
  Administered 2018-11-06: 10 mL
  Filled 2018-11-06: qty 10

## 2018-11-06 MED ORDER — SODIUM CHLORIDE 0.9 % IV SOLN
Freq: Once | INTRAVENOUS | Status: AC
  Administered 2018-11-06: 15:00:00 via INTRAVENOUS
  Filled 2018-11-06: qty 250

## 2018-11-06 MED ORDER — DIPHENOXYLATE-ATROPINE 2.5-0.025 MG PO TABS
2.0000 | ORAL_TABLET | Freq: Once | ORAL | Status: AC
  Administered 2018-11-06: 16:00:00 2 via ORAL

## 2018-11-06 MED ORDER — DIPHENOXYLATE-ATROPINE 2.5-0.025 MG PO TABS
ORAL_TABLET | ORAL | Status: AC
  Filled 2018-11-06: qty 2

## 2018-11-06 MED ORDER — PROCHLORPERAZINE EDISYLATE 10 MG/2ML IJ SOLN
INTRAMUSCULAR | Status: AC
  Filled 2018-11-06: qty 2

## 2018-11-06 NOTE — Progress Notes (Signed)
PT received 1L IVF NS over 1 hour with antiemetics and anti-diarrheal medication.  Reports feeling better at end of infusion with no further N/V/D.  VSS.  Ambulatory to exit with belongings.

## 2018-11-06 NOTE — Progress Notes (Signed)
These preliminary result these preliminary results were noted.  Awaiting final report.

## 2018-11-06 NOTE — Telephone Encounter (Signed)
Nausea /vomiting /liquid BM 5-6 x since yesterday evening after chemo .Pt states she feels like there is a knot in her stomach.   She is not needing adult diapers as dtr is abe to get her to bathroom. She is weak. She is vomiting brownish colored emesis. Took her first dose compazine at 8 am today -does not have anything else to take  Last Normal BM was yesterday before chemo. Per [facility] appt with [facility].

## 2018-11-06 NOTE — Patient Instructions (Signed)

## 2018-11-08 ENCOUNTER — Telehealth: Payer: Self-pay | Admitting: *Deleted

## 2018-11-08 NOTE — Telephone Encounter (Signed)
Completed form for " Request for independent assessment for person care services" faxed to [employer]

## 2018-11-08 NOTE — Progress Notes (Signed)
[facility] Progress Note   Samantha Santos [birth year] 58 y.o.   Anjolaoluwa Siguenza is managed by Fanny Bien. Mohamed  Actively treated with chemotherapy/immunotherapy/hormonal therapy: yes  Current therapy: Cisplatin and irinotecan with the Udenyca support  Last treated: 11/05/2018 (cycle 1, day 8)  Next scheduled appointment with provider: 11/19/2018  Assessment: Plan:    Non-intractable vomiting with nausea, unspecified vomiting type - Plan: 0.9 %  sodium chloride infusion, prochlorperazine (COMPAZINE) injection 10 mg, LORazepam (ATIVAN) tablet 0.5 mg  Diarrhea, unspecified type - Plan: diphenoxylate-atropine (LOMOTIL) 2.5-0.025 MG per tablet 2 tablet  Port-A-Cath in place - Plan: heparin lock flush 100 unit/mL, sodium chloride flush (NS) 0.9 % injection 10 mL  Small cell carcinoma of upper lobe of left lung (HCC)   Small cell carcinoma of the left lung: The patient is status post cycle 1, day 8 of cisplatin and irinotecan with the Udenyca support.  Her last cycle was 11/13/2018.  She is scheduled to be seen in follow-up on 11/19/2018.  Diarrhea: The patient was given Lomotil 2 tablets p.o. x1 today.  Additionally she received 1 L normal saline IV today.  A prescription for Lomotil was sent to her pharmacy.  Nausea and vomiting: Patient was given Compazine 10 mg IV today and Ativan 0.5 mg sublingual x1.  Additionally she received 1 L normal saline.  A prescription for sublingual Ativan 0.5 mg was sent to her pharmacy.  Please see After Visit Summary for patient specific instructions.  Future Appointments  Date Time Provider [place]  11/19/2018  8:45 AM [facility] 1 [facility] None  11/19/2018  9:30 AM Heilingoetter, Cassandra L, PA-C [facility] None  11/19/2018 10:00 AM [facility] NURSE [facility] None  11/26/2018  8:00 AM [facility] LAB 4 [facility] None  11/26/2018  9:00 AM [facility] INFUSION [facility] None  12/10/2018  8:00 AM  [facility] LAB 4 [facility] None  12/10/2018  8:30 AM Curt Bears, MD [facility] None  12/10/2018  9:00 AM [facility] INFUSION [facility] None  12/17/2018  8:15 AM [facility] LAB 2 [facility] None  12/17/2018  8:30 AM [facility] [place] FLUSH [facility] None  12/17/2018  9:00 AM [facility] INFUSION [facility] None  12/31/2018  7:45 AM [facility] LAB 4 [facility] None  12/31/2018  8:15 AM [facility] [place] FLUSH [facility] None  12/31/2018  8:30 AM Curt Bears, MD [facility] None  12/31/2018  9:00 AM [facility] INFUSION [facility] None  01/07/2019  8:15 AM [facility] LAB ONLY [facility] None  01/07/2019  8:30 AM [facility] [place] FLUSH [facility] None  01/07/2019  9:00 AM [facility] INFUSION [facility] None    No orders of the defined types were placed in this encounter.      Subjective:   Patient ID:  Samantha Santos is a 58 y.o. (DOB 01-23-1961) female.  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   She is status post cycle 1, day 8 of cisplatin and irinotecan with Udenyca support.  Her last cycle of chemotherapy was dosed on 11/05/2018.  She presents to day with nausea and vomiting along with diarrhea since yesterday evening.  She is having generalized abdominal cramping.  She was given Aloxi with her treatment.  She took Compazine this morning but immediately threw it up.  She denies fevers, chills, chest  pain, and shortness of breath.  She does not have any medications at home for diarrhea.  Medications: I have reviewed the patient's current medications.  Allergies:  Allergies  Allergen Reactions   Penicillins Other (See Comments)    Syncope Did it involve swelling of the face/tongue/throat, SOB, or low BP? No Did it involve sudden or severe  rash/hives, skin peeling, or any reaction on the inside of your mouth or nose? No Did you need to seek medical attention at a hospital or doctor's office? Yes When did it last happen?over 20 years ago If all above answers are "NO", may proceed with cephalosporin use.    Oxycodone Other (See Comments)    hallucinations    Past Medical History:  Diagnosis Date   Anemia    Arthritis    GERD (gastroesophageal reflux disease)    Hypertension    Morbid obesity ([place]) 05/06/2018   Supraclavicular adenopathy     Past Surgical History:  Procedure Laterality Date   CESAREAN SECTION  1988   HYSTEROSCOPY W/D&C  06/14/2011   Procedure: DILATATION AND CURETTAGE /HYSTEROSCOPY;  Surgeon: Frederico Hamman, MD;  Location: [facility];  Service: Gynecology;  Laterality: N/A;   IR CV LINE INJECTION  06/25/2018   PORTACATH PLACEMENT Left 06/13/2018   Procedure: INSERTION PORT-A-CATH;  Surgeon: [name], MD;  Location: [facility];  Service: Thoracic;  Laterality: Left;   SUPRACLAVICAL NODE BIOPSY Right 05/10/2018   Procedure: SUPRACLAVICAL NODE BIOPSY;  Surgeon: [name], MD;  Location: [facility];  Service: Thoracic;  Laterality: Right;   TUBAL LIGATION      Family History  Problem Relation Age of Onset   Diabetes Mother    Hypertension Mother    CAD Mother    CVA Mother     Social History   Socioeconomic History   Marital status: Single    Spouse name: Not on file   Number of children: Not on file   Years of education: Not on file   Highest education level: Not on file  Occupational History   Not on file  Social Needs   Financial resource strain: Not on file   Food insecurity    Worry: Not on file    Inability: Not on file   Transportation needs    Medical: Not on file    Non-medical: Not on file  Tobacco Use   Smoking status: Never Smoker   Smokeless tobacco: Never Used  Substance and Sexual Activity   Alcohol use: No   Drug use:  No   Sexual activity: Not Currently  Lifestyle   Physical activity    Days per week: Not on file    Minutes per session: Not on file   Stress: Not on file  Relationships   Social connections    Talks on phone: Not on file    Gets together: Not on file    Attends religious service: Not on file    Active member of club or organization: Not on file    Attends meetings of clubs or organizations: Not on file    Relationship status: Not on file   Intimate partner violence    Fear of current or ex partner: Not on file    Emotionally abused: Not on file    Physically abused: Not on file    Forced sexual activity: Not on file  Other Topics Concern   Not on file  Social History Narrative   Not on file  Past Medical History, Surgical history, Social history, and Family history were reviewed and updated as appropriate.   Please see review of systems for further details on the patient's review from today.   Review of Systems:  Review of Systems  Constitutional: Negative for appetite change, chills, diaphoresis and fever.  HENT: Negative for trouble swallowing.   Respiratory: Negative for cough, chest tightness and shortness of breath.   Cardiovascular: Negative for chest pain, palpitations and leg swelling.  Gastrointestinal: Positive for abdominal pain, diarrhea, nausea and vomiting. Negative for abdominal distention, blood in stool and constipation.  Genitourinary: Negative for decreased urine volume and difficulty urinating.  Neurological: Negative for weakness.    Objective:   Physical Exam:  BP (!) 136/91    Pulse 78    Temp 98.5 F (36.9 C)    Resp 18    Ht 5\' 7"  (1.702 m)    Wt 282 lb 6.4 oz (128.1 kg)    SpO2 100%    BMI 44.23 kg/m  ECOG: 1  Physical Exam Constitutional:      General: She is not in acute distress.    Appearance: She is not diaphoretic.  HENT:     Head: Normocephalic and atraumatic.  Cardiovascular:     Rate and Rhythm: Normal rate and  regular rhythm.     Heart sounds: Normal heart sounds. No murmur. No friction rub. No gallop.   Pulmonary:     Effort: Pulmonary effort is normal. No respiratory distress.     Breath sounds: Normal breath sounds. No wheezing or rales.  Abdominal:     General: Bowel sounds are increased. There is no distension.     Palpations: Abdomen is soft. There is no mass.     Tenderness: There is no abdominal tenderness. There is no guarding or rebound.  Skin:    General: Skin is warm and dry.     Findings: No erythema or rash.  Neurological:     Mental Status: She is alert.     Coordination: Coordination normal.  Psychiatric:        Behavior: Behavior normal.        Thought Content: Thought content normal.        Judgment: Judgment normal.     Lab Review:     Component Value Date/Time   NA 138 11/06/2018 1453   NA 144 04/08/2018 1145   K 4.3 11/06/2018 1453   CL 106 11/06/2018 1453   CO2 23 11/06/2018 1453   GLUCOSE 126 (H) 11/06/2018 1453   BUN 32 (H) 11/06/2018 1453   BUN 10 04/08/2018 1145   CREATININE 1.28 (H) 11/06/2018 1453   CALCIUM 8.7 (L) 11/06/2018 1453   PROT 7.6 11/06/2018 1453   ALBUMIN 3.3 (L) 11/06/2018 1453   AST 7 (L) 11/06/2018 1453   ALT 12 11/06/2018 1453   ALKPHOS 105 11/06/2018 1453   BILITOT 0.4 11/06/2018 1453   GFRNONAA 46 (L) 11/06/2018 1453   GFRAA 54 (L) 11/06/2018 1453       Component Value Date/Time   WBC 4.7 11/06/2018 1453   WBC 1.3 (LL) 06/25/2018 0301   RBC 4.16 11/06/2018 1453   HGB 10.6 (L) 11/06/2018 1453   HGB 12.7 04/08/2018 1145   HCT 33.2 (L) 11/06/2018 1453   HCT 39.2 04/08/2018 1145   PLT 174 11/06/2018 1453   PLT 208 04/08/2018 1145   MCV 79.8 (L) 11/06/2018 1453   MCV 79 04/08/2018 1145   MCH 25.5 (L) 11/06/2018 1453  MCHC 31.9 11/06/2018 1453   RDW 18.2 (H) 11/06/2018 1453   RDW 16.1 (H) 04/08/2018 1145   LYMPHSABS 0.5 (L) 11/06/2018 1453   LYMPHSABS 1.8 04/08/2018 1145   MONOABS 0.4 11/06/2018 1453   EOSABS 0.0  11/06/2018 1453   EOSABS 0.1 04/08/2018 1145   BASOSABS 0.0 11/06/2018 1453   BASOSABS 0.0 04/08/2018 1145   -------------------------------  Imaging from last 24 hours (if applicable):  Radiology interpretation: Ct Chest W Contrast  Result Date: 10/16/2018 CLINICAL DATA:  History of lung cancer. EXAM: CT CHEST, ABDOMEN, AND PELVIS WITH CONTRAST TECHNIQUE: Multidetector CT imaging of the chest, abdomen and pelvis was performed following the standard protocol during bolus administration of intravenous contrast. CONTRAST:  150mL OMNIPAQUE IOHEXOL 300 MG/ML  SOLN COMPARISON:  CT chest, abdomen and pelvis 07/26/2018 FINDINGS: CT CHEST FINDINGS Cardiovascular: The heart size appears within normal limits. No pericardial effusion. Mediastinum/Nodes: Normal appearance of the thyroid gland. The trachea appears patent and is midline. Small hiatal hernia. Anterior mediastinal mass measures 6.3 x 3.3 cm, image 20/2. On the previous examination this measured 5.9 x 2.4 cm. Nodal mass within the left prevascular anterior mediastinum measures 2.8 x 2.1 cm, image 22/2. Previously 1.5 x 1.2 cm. New enlarged left axillary lymph node has a short axis of 1.7 cm, image 16/2. New left supraclavicular lymph node measures 1.2 cm, image 9/2. Index right supraclavicular lymph node measures 2.2 cm, image 4/2. Previously 1.6 cm. - New right CP angle node measures 1.9 cm, image 36/2. Pericardial nodule is new measuring 1.6 cm, image 37/2. Lungs/Pleura: No pleural effusion. Small left effusion has resolved in the interval. Wedge-shaped area of subsegmental atelectasis within the posterior left lower lobe is unchanged, image 71/6. Musculoskeletal: Stable sclerotic lesion involving the sternal manubrium. No new bone lesions. CT ABDOMEN PELVIS FINDINGS Hepatobiliary: No focal liver abnormality. Previously noted thrombus in left hepatic vein is resolved. Gallbladder normal. No biliary ductal dilatation. Pancreas: Normal appearance of the  pancreas. Spleen: Normal size spleen containing several stable, indeterminate low-attenuation foci. Adrenals/Urinary Tract: The adrenal glands appear normal. Bilateral kidney cysts. No hydronephrosis. The urinary bladder is normal. Stomach/Bowel: Stomach is within normal limits. Appendix appears normal. No evidence of bowel wall thickening, distention, or inflammatory changes. Vascular/Lymphatic: No significant vascular findings are present. No enlarged abdominal or pelvic lymph nodes. Reproductive: Uterus and bilateral adnexa are unremarkable. Other: No abdominal wall hernia or abnormality. No abdominopelvic ascites. Musculoskeletal: No acute or significant osseous findings. IMPRESSION: 1. Since 07/26/2018 there has been interval progression of disease. Interval increase in size of mediastinal, bilateral supraclavicular, and left axillary adenopathy. New right CP angle adenopathy. 2. Resolution of small left effusion. Unchanged left lower lobe wedge-shaped area of subsegmental atelectasis. 3. 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 4. Resolution of thrombus in left hepatic vein. 5. Unchanged small low-density splenic lesions. Electronically Signed   By: Kerby Moors M.D.   On: 10/16/2018 14:49   Ct Abdomen Pelvis W Contrast  Result Date: 10/16/2018 CLINICAL DATA:  History of lung cancer. EXAM: CT CHEST, ABDOMEN, AND PELVIS WITH CONTRAST TECHNIQUE: Multidetector CT imaging of the chest, abdomen and pelvis was performed following the standard protocol during bolus administration of intravenous contrast. CONTRAST:  14mL OMNIPAQUE IOHEXOL 300 MG/ML  SOLN COMPARISON:  CT chest, abdomen and pelvis 07/26/2018 FINDINGS: CT CHEST FINDINGS Cardiovascular: The heart size appears within normal limits. No pericardial effusion. Mediastinum/Nodes: Normal appearance of the thyroid gland. The trachea appears patent and is midline. Small hiatal hernia.  Anterior mediastinal mass measures 6.3 x 3.3 cm,  image 20/2. On the previous examination this measured 5.9 x 2.4 cm. Nodal mass within the left prevascular anterior mediastinum measures 2.8 x 2.1 cm, image 22/2. Previously 1.5 x 1.2 cm. New enlarged left axillary lymph node has a short axis of 1.7 cm, image 16/2. New left supraclavicular lymph node measures 1.2 cm, image 9/2. Index right supraclavicular lymph node measures 2.2 cm, image 4/2. Previously 1.6 cm. - New right CP angle node measures 1.9 cm, image 36/2. Pericardial nodule is new measuring 1.6 cm, image 37/2. Lungs/Pleura: No pleural effusion. Small left effusion has resolved in the interval. Wedge-shaped area of subsegmental atelectasis within the posterior left lower lobe is unchanged, image 71/6. Musculoskeletal: Stable sclerotic lesion involving the sternal manubrium. No new bone lesions. CT ABDOMEN PELVIS FINDINGS Hepatobiliary: No focal liver abnormality. Previously noted thrombus in left hepatic vein is resolved. Gallbladder normal. No biliary ductal dilatation. Pancreas: Normal appearance of the pancreas. Spleen: Normal size spleen containing several stable, indeterminate low-attenuation foci. Adrenals/Urinary Tract: The adrenal glands appear normal. Bilateral kidney cysts. No hydronephrosis. The urinary bladder is normal. Stomach/Bowel: Stomach is within normal limits. Appendix appears normal. No evidence of bowel wall thickening, distention, or inflammatory changes. Vascular/Lymphatic: No significant vascular findings are present. No enlarged abdominal or pelvic lymph nodes. Reproductive: Uterus and bilateral adnexa are unremarkable. Other: No abdominal wall hernia or abnormality. No abdominopelvic ascites. Musculoskeletal: No acute or significant osseous findings. IMPRESSION: 1. Since 07/26/2018 there has been interval progression of disease. Interval increase in size of mediastinal, bilateral supraclavicular, and left axillary adenopathy. New right CP angle adenopathy. 2. Resolution of small  left effusion. Unchanged left lower lobe wedge-shaped area of subsegmental atelectasis. 3. 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 4. Resolution of thrombus in left hepatic vein. 5. Unchanged small low-density splenic lesions. Electronically Signed   By: Kerby Moors M.D.   On: 10/16/2018 14:49      This case was discussed with Dr. Julien Nordmann. He expressed agreement with my management of this patient.

## 2018-11-11 ENCOUNTER — Ambulatory Visit: Payer: Medicaid Other | Admitting: Internal Medicine

## 2018-11-11 ENCOUNTER — Ambulatory Visit: Payer: Medicaid Other

## 2018-11-11 ENCOUNTER — Other Ambulatory Visit: Payer: Medicaid Other

## 2018-11-18 ENCOUNTER — Other Ambulatory Visit: Payer: Medicaid Other

## 2018-11-18 ENCOUNTER — Ambulatory Visit: Payer: Medicaid Other

## 2018-11-18 ENCOUNTER — Telehealth: Payer: Self-pay | Admitting: Medical Oncology

## 2018-11-18 ENCOUNTER — Ambulatory Visit: Payer: Medicaid Other | Admitting: Physician Assistant

## 2018-11-18 NOTE — Telephone Encounter (Signed)
L Breast pain - " ? Bruise/skin dark, tender to touch on her left titty". Thought it was heartburn , took Tums ,but did not help pain. Dtr wants to make sure someone follows up this pain. Pt scheduled to see Cassie tomorrow.

## 2018-11-19 ENCOUNTER — Telehealth: Payer: Self-pay | Admitting: *Deleted

## 2018-11-19 ENCOUNTER — Other Ambulatory Visit: Payer: Self-pay

## 2018-11-19 ENCOUNTER — Inpatient Hospital Stay: Payer: Medicaid Other | Attending: Internal Medicine

## 2018-11-19 ENCOUNTER — Inpatient Hospital Stay: Payer: Medicaid Other

## 2018-11-19 ENCOUNTER — Inpatient Hospital Stay (HOSPITAL_BASED_OUTPATIENT_CLINIC_OR_DEPARTMENT_OTHER): Payer: Medicaid Other | Admitting: Physician Assistant

## 2018-11-19 VITALS — BP 116/83 | HR 100 | Temp 97.7°F | Resp 18 | Ht 67.0 in | Wt 278.0 lb

## 2018-11-19 DIAGNOSIS — C7A1 Malignant poorly differentiated neuroendocrine tumors: Secondary | ICD-10-CM | POA: Diagnosis not present

## 2018-11-19 DIAGNOSIS — Z5111 Encounter for antineoplastic chemotherapy: Secondary | ICD-10-CM | POA: Insufficient documentation

## 2018-11-19 DIAGNOSIS — R5382 Chronic fatigue, unspecified: Secondary | ICD-10-CM

## 2018-11-19 DIAGNOSIS — Z95828 Presence of other vascular implants and grafts: Secondary | ICD-10-CM

## 2018-11-19 DIAGNOSIS — C7B8 Other secondary neuroendocrine tumors: Secondary | ICD-10-CM | POA: Insufficient documentation

## 2018-11-19 DIAGNOSIS — Z7689 Persons encountering health services in other specified circumstances: Secondary | ICD-10-CM | POA: Diagnosis not present

## 2018-11-19 DIAGNOSIS — I951 Orthostatic hypotension: Secondary | ICD-10-CM | POA: Diagnosis not present

## 2018-11-19 DIAGNOSIS — Z79899 Other long term (current) drug therapy: Secondary | ICD-10-CM | POA: Insufficient documentation

## 2018-11-19 DIAGNOSIS — D696 Thrombocytopenia, unspecified: Secondary | ICD-10-CM | POA: Diagnosis not present

## 2018-11-19 DIAGNOSIS — E876 Hypokalemia: Secondary | ICD-10-CM

## 2018-11-19 DIAGNOSIS — C3412 Malignant neoplasm of upper lobe, left bronchus or lung: Secondary | ICD-10-CM

## 2018-11-19 DIAGNOSIS — D709 Neutropenia, unspecified: Secondary | ICD-10-CM | POA: Insufficient documentation

## 2018-11-19 DIAGNOSIS — C3492 Malignant neoplasm of unspecified part of left bronchus or lung: Secondary | ICD-10-CM

## 2018-11-19 DIAGNOSIS — M549 Dorsalgia, unspecified: Secondary | ICD-10-CM | POA: Insufficient documentation

## 2018-11-19 DIAGNOSIS — R42 Dizziness and giddiness: Secondary | ICD-10-CM | POA: Insufficient documentation

## 2018-11-19 LAB — CBC WITH DIFFERENTIAL (CANCER CENTER ONLY)
Abs Immature Granulocytes: 0 10*3/uL (ref 0.00–0.07)
Basophils Absolute: 0 10*3/uL (ref 0.0–0.1)
Basophils Relative: 0 %
Eosinophils Absolute: 0 10*3/uL (ref 0.0–0.5)
Eosinophils Relative: 1 %
HCT: 29.2 % — ABNORMAL LOW (ref 36.0–46.0)
Hemoglobin: 9.5 g/dL — ABNORMAL LOW (ref 12.0–15.0)
Immature Granulocytes: 0 %
Lymphocytes Relative: 59 %
Lymphs Abs: 1.2 10*3/uL (ref 0.7–4.0)
MCH: 25.1 pg — ABNORMAL LOW (ref 26.0–34.0)
MCHC: 32.5 g/dL (ref 30.0–36.0)
MCV: 77 fL — ABNORMAL LOW (ref 80.0–100.0)
Monocytes Absolute: 0.1 10*3/uL (ref 0.1–1.0)
Monocytes Relative: 6 %
Neutro Abs: 0.7 10*3/uL — ABNORMAL LOW (ref 1.7–7.7)
Neutrophils Relative %: 34 %
Platelet Count: 115 10*3/uL — ABNORMAL LOW (ref 150–400)
RBC: 3.79 MIL/uL — ABNORMAL LOW (ref 3.87–5.11)
RDW: 19.7 % — ABNORMAL HIGH (ref 11.5–15.5)
WBC Count: 2 10*3/uL — ABNORMAL LOW (ref 4.0–10.5)
nRBC: 0 % (ref 0.0–0.2)

## 2018-11-19 LAB — CMP (CANCER CENTER ONLY)
ALT: 14 U/L (ref 0–44)
AST: 8 U/L — ABNORMAL LOW (ref 15–41)
Albumin: 3 g/dL — ABNORMAL LOW (ref 3.5–5.0)
Alkaline Phosphatase: 94 U/L (ref 38–126)
Anion gap: 11 (ref 5–15)
BUN: 15 mg/dL (ref 6–20)
CO2: 25 mmol/L (ref 22–32)
Calcium: 8.7 mg/dL — ABNORMAL LOW (ref 8.9–10.3)
Chloride: 105 mmol/L (ref 98–111)
Creatinine: 1.2 mg/dL — ABNORMAL HIGH (ref 0.44–1.00)
GFR, Est AFR Am: 58 mL/min — ABNORMAL LOW (ref >60)
GFR, Estimated: 50 mL/min — ABNORMAL LOW (ref >60)
Glucose, Bld: 122 mg/dL — ABNORMAL HIGH (ref 70–99)
Potassium: 3 mmol/L — CL (ref 3.5–5.1)
Sodium: 141 mmol/L (ref 135–145)
Total Bilirubin: 0.8 mg/dL (ref 0.3–1.2)
Total Protein: 7.3 g/dL (ref 6.5–8.1)

## 2018-11-19 LAB — TSH: TSH: 1.835 u[IU]/mL (ref 0.308–3.960)

## 2018-11-19 LAB — MAGNESIUM: Magnesium: 1.6 mg/dL — ABNORMAL LOW (ref 1.7–2.4)

## 2018-11-19 MED ORDER — SODIUM CHLORIDE 0.9 % IV SOLN: Freq: Once | INTRAVENOUS | Status: DC

## 2018-11-19 MED ORDER — HEPARIN SOD (PORK) LOCK FLUSH 10 UNIT/ML IV SOLN: 10.0000 [IU] | Freq: Once | INTRAVENOUS | Status: DC

## 2018-11-19 MED ORDER — SODIUM CHLORIDE 0.9 % IV SOLN
Freq: Once | INTRAVENOUS | Status: AC
  Administered 2018-11-19: 11:00:00 via INTRAVENOUS
  Filled 2018-11-19: qty 10

## 2018-11-19 MED ORDER — TBO-FILGRASTIM 480 MCG/0.8ML ~~LOC~~ SOSY
480.0000 ug | PREFILLED_SYRINGE | Freq: Once | SUBCUTANEOUS | Status: AC
  Administered 2018-11-19: 480 ug via SUBCUTANEOUS

## 2018-11-19 MED ORDER — SODIUM CHLORIDE 0.9% FLUSH
10.0000 mL | Freq: Once | INTRAVENOUS | Status: AC
  Administered 2018-11-19: 10 mL via INTRAVENOUS
  Filled 2018-11-19: qty 10

## 2018-11-19 MED ORDER — TBO-FILGRASTIM 480 MCG/0.8ML ~~LOC~~ SOSY
PREFILLED_SYRINGE | SUBCUTANEOUS | Status: AC
  Filled 2018-11-19: qty 0.8

## 2018-11-19 MED ORDER — HEPARIN SOD (PORK) LOCK FLUSH 100 UNIT/ML IV SOLN
500.0000 [IU] | Freq: Once | INTRAVENOUS | Status: AC
  Administered 2018-11-19: 500 [IU] via INTRAVENOUS
  Filled 2018-11-19: qty 5

## 2018-11-19 MED ORDER — GABAPENTIN 100 MG PO CAPS: 100.0000 mg | ORAL_CAPSULE | Freq: Every day | ORAL | 0 refills | Status: DC

## 2018-11-19 MED ORDER — LIDOCAINE-PRILOCAINE 2.5-2.5 % EX CREA: 1.0000 "application " | TOPICAL_CREAM | CUTANEOUS | 0 refills | Status: AC | PRN

## 2018-11-19 NOTE — Progress Notes (Signed)
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  PRIOR THERAPY: 1) Carboplatin for AUC of 5 on day 1, etoposide 100 mg/M2 on days 1, 2 and 3 with Neulasta support in addition to [place].First dose February 10th, 2020. Status post 4 cycles. 2) Maintenance treatment with Tecentriq 1200 mg IV every 3 weeks.  First dose Sep 10, 2018.  Status post 3 cycles.  This was discontinued secondary to disease progression.  CURRENT THERAPY: Systemic chemotherapy with cisplatin 30 mg/M2 and irinotecan 65 mg/M2 on days 1 and 8 every 3 weeks.  First dose October 28, 2018. Status post 1 cycle of treatment.   INTERVAL HISTORY: Samantha Santos 58 y.o. female returns to the clinic for a follow-up visit.  The patient is status post her first cycle of her new chemotherapy regimen with cisplatin and irinotecan.  The patient experienced nausea, vomiting, diarrhea following day 1 of her cycle of treatment.  The patient was seen in the symptom management clinic and received fluid, Ativan, and Lomotil.  Since this time her diarrhea has subsided and she tolerated day 8 of her first cycle of treatment without any recurring symptoms. Today she is feeling fair except she endorses light-headedness upon standing and left chest wall pain. The patient states that she only experiences light headedness when standing up but her symptoms resolve when she is seated. He notes associated increased heart rate when she stands as well. She drinks approximately 3-4 of the small 8 ounce water bottles per day.   Patient states that the left chest wall pain has been gradually over the last week or so.  Proximately 2 days or so the patient was playing in bed she began to experience her symptoms again.  The patient attributed her symptoms to indigestion  but trying to take reflux medication without relief.  The patient then called EMS for evaluation.  The patient characterizes her chest discomfort has a "pulling" sensation in that the area is occasionally tender to touch.  Patient is tried to take Tylenol without significant relief.  During this episode the patient denies any nausea, vomiting, diaphoresis, or left shoulder/jaw pain.  She does have associated chronic back pain. The patient notes that her symptoms seem to have improved today.   Otherwise, the patient denies any recent fevers, chills, night sweats, or weight loss.  She endorses her baseline productive cough which produces yellow sputum as well as baseline shortness of breath with exertion.  She denies any hemoptysis.  She denies any more nausea, vomiting, diarrhea, or constipation.  She denies any headaches or visual changes.  She is here today for evaluation before starting cycle #2.  MEDICAL HISTORY: Past Medical History:  Diagnosis Date  . Anemia   . Arthritis   . GERD (gastroesophageal reflux disease)   . Hypertension   . Morbid obesity ([place]) 05/06/2018  . Supraclavicular adenopathy     ALLERGIES:  is allergic to penicillins and oxycodone.  MEDICATIONS:  Current Outpatient Medications  Medication Sig Dispense Refill  . diphenoxylate-atropine (LOMOTIL) 2.5-0.025 MG tablet 1 to 2 tablets 4 times daily as needed for diarrhea 40 tablet 1  . lidocaine-prilocaine (EMLA) cream Apply 1 application topically as needed (chemo port). 30 g 0  . loratadine (CLARITIN) 10 MG tablet Take 10 mg by mouth daily.    Marland Kitchen  LORazepam (ATIVAN) 0.5 MG tablet Place 1 tablet (0.5 mg total) under the tongue every 6 (six) hours as needed (Nausea). 30 tablet 1  . potassium chloride 20 MEQ TBCR Take 20 mEq by mouth daily. 7 tablet 0  . prochlorperazine (COMPAZINE) 10 MG tablet Take 1 tablet (10 mg total) by mouth every 6 (six) hours as needed for nausea or vomiting. 50 tablet 3  . rivaroxaban (XARELTO) 20  MG TABS tablet Take 1 tablet (20 mg total) by mouth daily with supper. 30 tablet 2  . traMADol (ULTRAM) 50 MG tablet Take 1 tablet (50 mg total) by mouth every 6 (six) hours as needed. 20 tablet 0  . gabapentin (NEURONTIN) 100 MG capsule Take 1 capsule (100 mg total) by mouth at bedtime. 30 capsule 0   No current facility-administered medications for this visit.     SURGICAL HISTORY:  Past Surgical History:  Procedure Laterality Date  . CESAREAN SECTION  1988  . HYSTEROSCOPY W/D&C  06/14/2011   Procedure: DILATATION AND CURETTAGE /HYSTEROSCOPY;  Surgeon: Frederico Hamman, MD;  Location: [facility];  Service: Gynecology;  Laterality: N/A;  . IR CV LINE INJECTION  06/25/2018  . PORTACATH PLACEMENT Left 06/13/2018   Procedure: INSERTION PORT-A-CATH;  Surgeon: [name], MD;  Location: [facility];  Service: Thoracic;  Laterality: Left;  . SUPRACLAVICAL NODE BIOPSY Right 05/10/2018   Procedure: SUPRACLAVICAL NODE BIOPSY;  Surgeon: [name], MD;  Location: [facility];  Service: Thoracic;  Laterality: Right;  . TUBAL LIGATION      REVIEW OF SYSTEMS:   Review of Systems  Constitutional: Positive for generalized weakness and fatigue. Negative for appetite change, chills, fever and unexpected weight change.  HENT: Negative for mouth sores, nosebleeds, sore throat and trouble swallowing.   Eyes: Negative for eye problems and icterus.  Respiratory: Positive for baseline productive cough and shortness of breath with exertion. Negative for hemoptysis and wheezing.   Cardiovascular: Positive for left anterior chest wall pain. Negative for leg swelling.  Gastrointestinal: Positive for nausea/vomiting/and diarrhea following her first cycle of treatment (resolved). Negative for abdominal pain and constipation  Genitourinary: Negative for bladder incontinence, difficulty urinating, dysuria, frequency and hematuria.   Musculoskeletal: Positive for chronic back pain. Negative for gait problem, neck  pain and neck stiffness.  Skin: Negative for itching and rash.  Neurological: Positive for light headedness upon standing. Negative for dizziness, extremity weakness, gait problem, headaches, and seizures.  Hematological: Negative for adenopathy. Does not bruise/bleed easily.  Psychiatric/Behavioral: Negative for confusion, depression and sleep disturbance. The patient is not nervous/anxious.     PHYSICAL EXAMINATION:  Blood pressure 116/83, pulse 100, temperature 97.7 F (36.5 C), temperature source Oral, resp. rate 18, height 5\' 7"  (1.702 m), weight 278 lb (126.1 kg), SpO2 98 %.  ECOG PERFORMANCE STATUS: 1 - Symptomatic but completely ambulatory  Physical Exam  Constitutional: Oriented to person, place, and time and well-developed, well-nourished, and in no distress. No distress.  HENT:  Head: Normocephalic and atraumatic.  Mouth/Throat: Oropharynx is clear and moist. No oropharyngeal exudate.  Eyes: Conjunctivae are normal. Right eye exhibits no discharge. Left eye exhibits no discharge. No scleral icterus.  Neck: Normal range of motion. Neck supple.  Cardiovascular: Normal rate, regular rhythm, normal heart sounds and intact distal pulses.   Pulmonary/Chest: Effort normal and breath sounds normal. No respiratory distress. No wheezes. No rales.  Abdominal: Soft. Bowel sounds are normal. Exhibits no distension and no mass. There is no tenderness.  Musculoskeletal: Normal  range of motion. Exhibits no edema.  Lymphadenopathy:    No cervical adenopathy.  Neurological: Alert and oriented to person, place, and time. Exhibits normal muscle tone. Gait normal. Coordination normal.  Skin: Skin is warm and dry. No rash noted. Not diaphoretic. No erythema. No pallor.  Psychiatric: Mood, memory and judgment normal.  Vitals reviewed.  LABORATORY DATA: Lab Results  Component Value Date   WBC 2.0 (L) 11/19/2018   HGB 9.5 (L) 11/19/2018   HCT 29.2 (L) 11/19/2018   MCV 77.0 (L) 11/19/2018    PLT 115 (L) 11/19/2018      Chemistry      Component Value Date/Time   NA 141 11/19/2018 0839   NA 144 04/08/2018 1145   K 3.0 (LL) 11/19/2018 0839   CL 105 11/19/2018 0839   CO2 25 11/19/2018 0839   BUN 15 11/19/2018 0839   BUN 10 04/08/2018 1145   CREATININE 1.20 (H) 11/19/2018 0839      Component Value Date/Time   CALCIUM 8.7 (L) 11/19/2018 0839   ALKPHOS 94 11/19/2018 0839   AST 8 (L) 11/19/2018 0839   ALT 14 11/19/2018 0839   BILITOT 0.8 11/19/2018 0839       RADIOGRAPHIC STUDIES:  No results found.   ASSESSMENT/PLAN:  This is a very pleasant 58 year old African-American female with stage IV (T2AN3, M1C) high-grade neuroendocrine carcinoma with focal areas of small cell lung cancer.  She was diagnosed in January 2020.  She previously underwent treatment with carboplatin for an AUC of 5 on day 1, etoposide 100 mg/m on days 1, 2, and 3 with Tecentriq and Neulasta support.  She is status post 7 cycles. Starting from cycle #5, the patient had been on maintenance tecentriq. This was discontinued due to evidence of disease progression.   She is currently undergoing systemic chemotherapy with cisplatin 30 mg/m and her undertaking 65 mg/m on days 1 and 8 every 3 weeks.  She is status post her first cycle of treatment.  Following her first cycle of treatment she experienced nausea, vomiting, and diarrhea.  This subsided the following day with the addition of fluid, ativan, and lomotil. She was also advised today to keep imodium at her home if needed for diarrhea.   The patient was seen with Dr. Julien Nordmann today.  Labs were reviewed. The patient is neutropenic today with an ANC of 0.7. She will receive a dose of granix today and tomorrow for her neutropenia. We will also add neulasta support following day 8 of treatment for her future cycles of chemotherapy.   Her potassium was low at 3.0 today and her magnesium is low at 1.6. She will receive an additional 20 meq of KCl and 2 g of  magnesium sulfate in 1L of fluid today. She was also provided a hand-out on potassium rich foods.   We will see the patient back for a follow up visit in 1 week for evaluation and to review her labs before starting cycle #2.   I will order a restaging CT scan at her next visit.   For her chest wall and back pain, we will further evaluate this region on her upcoming CT scan. In the meantime, Dr. Julien Nordmann believes this anterior rib pain may be referred pain from her back pain. I will send a prescription for gabapentin to the patient's pharmacy.   The patient was encouraged to hydrate and increase her fluid intake. She also was encouraged to stand up slower for her orthostatic hypotension/light-headedness.   The  patient was advised to call immediately if she has any concerning symptoms in the interval. The patient voices understanding of current disease status and treatment options and is in agreement with the current care plan. All questions were answered. The patient knows to call the clinic with any problems, questions or concerns. We can certainly see the patient much sooner if necessary  No orders of the defined types were placed in this encounter.    Lenn Volker L Kyng Matlock, PA-C 11/19/18  ADDENDUM: Hematology/Oncology Attending: I had a face-to-face encounter with the patient.  I recommended her care plan.  She is a very pleasant 58 years old African-American female with metastatic high-grade neuroendocrine carcinoma status post induction chemotherapy with carboplatin and etoposide with initial partial responses and disease progression. The patient is currently undergoing systemic chemotherapy with reduced dose cisplatin and irinotecan status post 1 cycle.  She was supposed to start cycle #2 today but unfortunately her absolute neutrophil count is low. I recommended for the patient to delay the start of cycle #2 by at least 1 week until improvement of her blood count.  We will also  provide the patient with Granix 480 mcg subcutaneous for 2 days. Starting from cycle #2 the patient will receive Onpro on day #8 of her treatment to avoid future neutropenia and neutropenic fever. For the hypokalemia, we will arrange for the patient to receive potassium chloride IV today in addition to hydration with normal saline. The patient will come back for follow-up visit in 1 week for evaluation before resuming her treatment. She was advised to call immediately if she has any concerning symptoms in the interval.  Disclaimer: This note was dictated with voice recognition software. Similar sounding words can inadvertently be transcribed and may be missed upon review. Eilleen Kempf, MD 11/20/18

## 2018-11-19 NOTE — Telephone Encounter (Signed)
[facility] faxed Prior authorization request for Lidocaine/Prilocaine cream 30 GM.  Request to [facility] letter tray receptacle of Prior Authorization forms for review.

## 2018-11-19 NOTE — Telephone Encounter (Signed)
Received call report from [place].  "Today's K+ = 3.0."  Spoke to providers with results.  Response: "[facility] aware of results."

## 2018-11-19 NOTE — Patient Instructions (Signed)
Hypokalemia Hypokalemia means that the amount of potassium in the blood is lower than normal. Potassium is a chemical (electrolyte) that helps regulate the amount of fluid in the body. It also stimulates muscle tightening (contraction) and helps nerves work properly. Normally, most of the body's potassium is inside cells, and only a very small amount is in the blood. Because the amount in the blood is so small, minor changes to potassium levels in the blood can be life-threatening. What are the causes? This condition may be caused by:  Antibiotic medicine.  Diarrhea or vomiting. Taking too much of a medicine that helps you have a bowel movement (laxative) can cause diarrhea and lead to hypokalemia.  Chronic kidney disease (CKD).  Medicines that help the body get rid of excess fluid (diuretics).  Eating disorders, such as bulimia.  Low magnesium levels in the body.  Sweating a lot. What are the signs or symptoms? Symptoms of this condition include:  Weakness.  Constipation.  Fatigue.  Muscle cramps.  Mental confusion.  Skipped heartbeats or irregular heartbeat (palpitations).  Tingling or numbness. How is this diagnosed? This condition is diagnosed with a blood test. How is this treated? This condition may be treated by:  Taking potassium supplements by mouth.  Adjusting the medicines that you take.  Eating more foods that contain a lot of potassium. If your potassium level is very low, you may need to get potassium through an IV and be monitored in the hospital. Follow these instructions at home:   Take over-the-counter and prescription medicines only as told by your health care provider. This includes vitamins and supplements.  Eat a healthy diet. A healthy diet includes fresh fruits and vegetables, whole grains, healthy fats, and lean proteins.  If instructed, eat more foods that contain a lot of potassium. This includes: ? Nuts, such as peanuts and pistachios.  ? Seeds, such as sunflower seeds and pumpkin seeds. ? Peas, lentils, and lima beans. ? Whole grain and bran cereals and breads. ? Fresh fruits and vegetables, such as apricots, avocado, bananas, cantaloupe, kiwi, oranges, tomatoes, asparagus, and potatoes. ? Orange juice. ? Tomato juice. ? Red meats. ? Yogurt.  Keep all follow-up visits as told by your health care provider. This is important. Contact a health care provider if you:  Have weakness that gets worse.  Feel your heart pounding or racing.  Vomit.  Have diarrhea.  Have diabetes (diabetes mellitus) and you have trouble keeping your blood sugar (glucose) in your target range. Get help right away if you:  Have chest pain.  Have shortness of breath.  Have vomiting or diarrhea that lasts for more than 2 days.  Faint. Summary  Hypokalemia means that the amount of potassium in the blood is lower than normal.  This condition is diagnosed with a blood test.  Hypokalemia may be treated by taking potassium supplements, adjusting the medicines that you take, or eating more foods that are high in potassium.  If your potassium level is very low, you may need to get potassium through an IV and be monitored in the hospital. This information is not intended to replace advice given to you by your health care provider. Make sure you discuss any questions you have with your health care provider. Document Released: 04/03/2005 Document Revised: 11/14/2017 Document Reviewed: 11/14/2017 Elsevier Patient Education  [employer]. Hypomagnesemia Hypomagnesemia is a condition in which the level of magnesium in the blood is low. Magnesium is a mineral that is found in many  foods. It is used in many different processes in the body. Hypomagnesemia can affect every organ in the body. In severe cases, it can cause life-threatening problems. What are the causes? This condition may be caused by:  Not getting enough magnesium in your diet.   Malnutrition.  Problems with absorbing magnesium from the intestines.  Dehydration.  Alcohol abuse.  Vomiting.  Severe or chronic diarrhea.  Some medicines, including medicines that make you urinate more (diuretics).  Certain diseases, such as kidney disease, diabetes, celiac disease, and overactive thyroid. What are the signs or symptoms? Symptoms of this condition include:  Loss of appetite.  Nausea and vomiting.  Involuntary shaking or trembling of a body part (tremor).  Muscle weakness.  Tingling in the arms and legs.  Sudden tightening of muscles (muscle spasms).  Confusion.  Psychiatric issues, such as depression, irritability, or psychosis.  A feeling of fluttering of the heart.  Seizures. These symptoms are more severe if magnesium levels drop suddenly. How is this diagnosed? This condition may be diagnosed based on:  Your symptoms and medical history.  A physical exam.  Blood and urine tests. How is this treated? Treatment depends on the cause and the severity of the condition. It may be treated with:  A magnesium supplement. This can be taken in pill form. If the condition is severe, magnesium is usually given through an IV.  Changes to your diet. You may be directed to eat foods that have a lot of magnesium, such as green leafy vegetables, peas, beans, and nuts.  Stopping any intake of alcohol. Follow these instructions at home:      Make sure that your diet includes foods with magnesium. Foods that have a lot of magnesium in them include: ? Green leafy vegetables, such as spinach and broccoli. ? Beans and peas. ? Nuts and seeds, such as almonds and sunflower seeds. ? Whole grains, such as whole grain bread and fortified cereals.  Take magnesium supplements if your health care provider tells you to do that. Take them as directed.  Take over-the-counter and prescription medicines only as told by your health care provider.  Have your  magnesium levels monitored as told by your health care provider.  When you are active, drink fluids that contain electrolytes.  Avoid drinking alcohol.  Keep all follow-up visits as told by your health care provider. This is important. Contact a health care provider if:  You get worse instead of better.  Your symptoms return. Get help right away if you:  Develop severe muscle weakness.  Have trouble breathing.  Feel that your heart is racing. Summary  Hypomagnesemia is a condition in which the level of magnesium in the blood is low.  Hypomagnesemia can affect every organ in the body.  Treatment may include eating more foods that contain magnesium, taking magnesium supplements, and not drinking alcohol.  Have your magnesium levels monitored as told by your health care provider. This information is not intended to replace advice given to you by your health care provider. Make sure you discuss any questions you have with your health care provider. Document Released: 12/28/2004 Document Revised: 03/16/2017 Document Reviewed: 03/05/2017 Elsevier Patient Education  2020 [employer].

## 2018-11-20 ENCOUNTER — Inpatient Hospital Stay: Payer: Medicaid Other

## 2018-11-20 ENCOUNTER — Other Ambulatory Visit: Payer: Self-pay

## 2018-11-20 ENCOUNTER — Telehealth: Payer: Self-pay | Admitting: Internal Medicine

## 2018-11-20 ENCOUNTER — Encounter: Payer: Self-pay | Admitting: Physician Assistant

## 2018-11-20 VITALS — BP 118/82 | HR 100 | Temp 98.2°F | Resp 18

## 2018-11-20 DIAGNOSIS — Z95828 Presence of other vascular implants and grafts: Secondary | ICD-10-CM

## 2018-11-20 DIAGNOSIS — Z5111 Encounter for antineoplastic chemotherapy: Secondary | ICD-10-CM | POA: Diagnosis not present

## 2018-11-20 MED ORDER — TBO-FILGRASTIM 480 MCG/0.8ML ~~LOC~~ SOSY
480.0000 ug | PREFILLED_SYRINGE | Freq: Once | SUBCUTANEOUS | Status: AC
  Administered 2018-11-20: 480 ug via SUBCUTANEOUS

## 2018-11-20 MED ORDER — TBO-FILGRASTIM 480 MCG/0.8ML ~~LOC~~ SOSY
PREFILLED_SYRINGE | SUBCUTANEOUS | Status: AC
  Filled 2018-11-20: qty 0.8

## 2018-11-20 NOTE — Telephone Encounter (Signed)
Adjusted appts per 8/04 los - pt daughter is aware of changes and aware of injection appt added today.

## 2018-11-20 NOTE — Patient Instructions (Signed)
Tbo-Filgrastim injection What is this medicine? TBO-FILGRASTIM (T B O fil GRA stim) is a granulocyte colony-stimulating factor that helps you make more neutrophils, a type of white blood cell. Neutrophils are important for fighting infections. Some chemotherapy affects your bone marrow and lowers your neutrophils. This medicine helps decrease the length of time that neutrophils are very low (severe neutropenia). This medicine may be used for other purposes; ask your health care provider or pharmacist if you have questions. COMMON BRAND NAME(S): Granix What should I tell my health care provider before I take this medicine? They need to know if you have any of these conditions:  bone scan or tests planned  kidney disease  sickle cell anemia  an unusual or allergic reaction to tbo-filgrastim, filgrastim, pegfilgrastim, other medicines, foods, dyes, or preservatives  pregnant or trying to get pregnant  breast-feeding How should I use this medicine? This medicine is for injection under the skin. If you get this medicine at home, you will be taught how to prepare and give this medicine. Refer to the Instructions for Use that come with your medication packaging. Use exactly as directed. Take your medicine at regular intervals. Do not take your medicine more often than directed. It is important that you put your used needles and syringes in a special sharps container. Do not put them in a trash can. If you do not have a sharps container, call your pharmacist or healthcare provider to get one. Talk to your pediatrician regarding the use of this medicine in children. While this drug may be prescribed for children as young as 68 month of age for selected conditions, precautions do apply. Overdosage: If you think you have taken too much of this medicine contact a poison control center or emergency room at once. NOTE: This medicine is only for you. Do not share this medicine with others. What if I miss a  dose? It is important not to miss your dose. Call your doctor or health care professional if you miss a dose. What may interact with this medicine? This medicine may interact with the following medications:  medicines that may cause a release of neutrophils, such as lithium This list may not describe all possible interactions. Give your health care provider a list of all the medicines, herbs, non-prescription drugs, or dietary supplements you use. Also tell them if you smoke, drink alcohol, or use illegal drugs. Some items may interact with your medicine. What should I watch for while using this medicine? You may need blood work done while you are taking this medicine. What side effects may I notice from receiving this medicine? Side effects that you should report to your doctor or health care professional as soon as possible:  allergic reactions like skin rash, itching or hives, swelling of the face, lips, or tongue  back pain  blood in the urine  dark urine  dizziness  fast heartbeat  feeling faint  shortness of breath or breathing problems  signs and symptoms of infection like fever or chills; cough; or sore throat  signs and symptoms of kidney injury like trouble passing urine or change in the amount of urine  stomach or side pain, or pain at the shoulder  sweating  swelling of the legs, ankles, or abdomen  tiredness Side effects that usually do not require medical attention (report to your doctor or health care professional if they continue or are bothersome):  bone pain  diarrhea  headache  muscle pain  vomiting This list may  not describe all possible side effects. Call your doctor for medical advice about side effects. You may report side effects to FDA at 1-800-FDA-1088. Where should I keep my medicine? Keep out of the reach of children. Store in a refrigerator between 2 and 8 degrees C (36 and 46 degrees F). Keep in carton to protect from light. Throw  away this medicine if it is left out of the refrigerator for more than 5 consecutive days. Throw away any unused medicine after the expiration date. NOTE: This sheet is a summary. It may not cover all possible information. If you have questions about this medicine, talk to your doctor, pharmacist, or health care provider.  2020 Elsevier/Gold Standard (2018-02-02 19:58:39)

## 2018-11-21 ENCOUNTER — Telehealth: Payer: Self-pay | Admitting: *Deleted

## 2018-11-21 NOTE — Telephone Encounter (Signed)
Rceived call from pt's daughter inquiring aboutpt's EMLA cream. She states the cost in $200. She states that this did not cost this much before.  Advised that it is likely waiting on Prior Authorization. Advised that I would have the nurse in charge of PA's look into it. Daughter voiced undertsnading

## 2018-11-25 ENCOUNTER — Ambulatory Visit: Payer: Medicaid Other

## 2018-11-25 ENCOUNTER — Other Ambulatory Visit: Payer: Medicaid Other

## 2018-11-26 ENCOUNTER — Other Ambulatory Visit: Payer: Self-pay

## 2018-11-26 ENCOUNTER — Other Ambulatory Visit: Payer: Self-pay | Admitting: Physician Assistant

## 2018-11-26 ENCOUNTER — Telehealth: Payer: Self-pay | Admitting: Physician Assistant

## 2018-11-26 ENCOUNTER — Encounter: Payer: Self-pay | Admitting: Physician Assistant

## 2018-11-26 ENCOUNTER — Inpatient Hospital Stay: Payer: Medicaid Other

## 2018-11-26 ENCOUNTER — Inpatient Hospital Stay (HOSPITAL_BASED_OUTPATIENT_CLINIC_OR_DEPARTMENT_OTHER): Payer: Medicaid Other | Admitting: Physician Assistant

## 2018-11-26 VITALS — BP 145/108 | HR 112 | Temp 98.0°F | Resp 18 | Ht 67.0 in | Wt 283.5 lb

## 2018-11-26 DIAGNOSIS — Z95828 Presence of other vascular implants and grafts: Secondary | ICD-10-CM

## 2018-11-26 DIAGNOSIS — C3412 Malignant neoplasm of upper lobe, left bronchus or lung: Secondary | ICD-10-CM

## 2018-11-26 DIAGNOSIS — Z5111 Encounter for antineoplastic chemotherapy: Secondary | ICD-10-CM

## 2018-11-26 LAB — CMP (CANCER CENTER ONLY)
ALT: 12 U/L (ref 0–44)
AST: 10 U/L — ABNORMAL LOW (ref 15–41)
Albumin: 3.7 g/dL (ref 3.5–5.0)
Alkaline Phosphatase: 90 U/L (ref 38–126)
Anion gap: 9 (ref 5–15)
BUN: 14 mg/dL (ref 6–20)
CO2: 28 mmol/L (ref 22–32)
Calcium: 8.8 mg/dL — ABNORMAL LOW (ref 8.9–10.3)
Chloride: 104 mmol/L (ref 98–111)
Creatinine: 1.03 mg/dL — ABNORMAL HIGH (ref 0.44–1.00)
GFR, Est AFR Am: >60 mL/min (ref >60)
GFR, Estimated: >60 mL/min (ref >60)
Glucose, Bld: 115 mg/dL — ABNORMAL HIGH (ref 70–99)
Potassium: 3.9 mmol/L (ref 3.5–5.1)
Sodium: 141 mmol/L (ref 135–145)
Total Bilirubin: 0.8 mg/dL (ref 0.3–1.2)
Total Protein: 8 g/dL (ref 6.5–8.1)

## 2018-11-26 LAB — CBC WITH DIFFERENTIAL (CANCER CENTER ONLY)
Abs Immature Granulocytes: 0.01 10*3/uL (ref 0.00–0.07)
Basophils Absolute: 0 10*3/uL (ref 0.0–0.1)
Basophils Relative: 0 %
Eosinophils Absolute: 0 10*3/uL (ref 0.0–0.5)
Eosinophils Relative: 0 %
HCT: 27.4 % — ABNORMAL LOW (ref 36.0–46.0)
Hemoglobin: 8.8 g/dL — ABNORMAL LOW (ref 12.0–15.0)
Immature Granulocytes: 0 %
Lymphocytes Relative: 51 %
Lymphs Abs: 1.5 10*3/uL (ref 0.7–4.0)
MCH: 25.6 pg — ABNORMAL LOW (ref 26.0–34.0)
MCHC: 32.1 g/dL (ref 30.0–36.0)
MCV: 79.7 fL — ABNORMAL LOW (ref 80.0–100.0)
Monocytes Absolute: 0.3 10*3/uL (ref 0.1–1.0)
Monocytes Relative: 9 %
Neutro Abs: 1.2 10*3/uL — ABNORMAL LOW (ref 1.7–7.7)
Neutrophils Relative %: 40 %
Platelet Count: 70 10*3/uL — ABNORMAL LOW (ref 150–400)
RBC: 3.44 MIL/uL — ABNORMAL LOW (ref 3.87–5.11)
RDW: 20.9 % — ABNORMAL HIGH (ref 11.5–15.5)
WBC Count: 3 10*3/uL — ABNORMAL LOW (ref 4.0–10.5)
nRBC: 0 % (ref 0.0–0.2)

## 2018-11-26 LAB — MAGNESIUM: Magnesium: 1.8 mg/dL (ref 1.7–2.4)

## 2018-11-26 MED ORDER — SODIUM CHLORIDE 0.9% FLUSH
10.0000 mL | INTRAVENOUS | Status: DC | PRN
  Administered 2018-11-26: 10 mL
  Filled 2018-11-26: qty 10

## 2018-11-26 MED ORDER — HEPARIN SOD (PORK) LOCK FLUSH 100 UNIT/ML IV SOLN
500.0000 [IU] | Freq: Once | INTRAVENOUS | Status: AC | PRN
  Administered 2018-11-26: 500 [IU]
  Filled 2018-11-26: qty 5

## 2018-11-26 NOTE — Telephone Encounter (Signed)
Scheduled appt per 8/11 los - gave patient print out of updated calender

## 2018-11-26 NOTE — Progress Notes (Signed)
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  PRIOR THERAPY: 1)Carboplatin for AUC of 5 on day 1, etoposide 100 mg/M2 on days 1, 2 and 3 with Neulasta support in addition to [place].First dose February 10th, 2020. Status post4cycles. 2)Maintenance treatment with Tecentriq 1200 mg IV every 3 weeks. First dose Sep 10, 2018. Status post 3cycles.This was discontinued secondary to disease progression.  CURRENT THERAPY: Systemic chemotherapy with cisplatin 30 mg/M2 and irinotecan 65 mg/M2 on days 1 and 8 every 3 weeks. First dose October 28, 2018. Status post 1 cycle of treatment.   INTERVAL HISTORY: Samantha Santos 58 y.o. female returns to the clinic for a follow-up visit.  At the patient's appointment last week, she was supposed to receive cycle #2 of cisplatin and irinotecan. However, her treatment was delayed until today due to her blood work showing electrolyte abnormalities as well as neutropenia. She received 2 injections of Granix.  Going forward, growth factor support with Neulasta will be added on day 8 of every cycle of treatment. She also received IV hydration with supplemental potassium and magnesium last week.    The patient is feeling fairly well today without any concerning complaints.  The soreness in her left breast that she felt last week has resolved at this time with the use of a heating pad. She denies using any aspirin, ibuprofen, or aleve. She does occasionally use tyelonal for pain. She denies any fever, chills, night sweats, or weight loss.  She denies any chest pain, shortness of breath, cough, or hemoptysis.  She denies any nausea, vomiting, diarrhea, or constipation.  Denies any headache or visual changes.  He is here today for evaluation and repeat blood work  before starting cycle #2.  MEDICAL HISTORY: Past Medical History:  Diagnosis Date  . Anemia   . Arthritis   . GERD (gastroesophageal reflux disease)   . Hypertension   . Morbid obesity (Samantha Santos) 05/06/2018  . Supraclavicular adenopathy     ALLERGIES:  is allergic to penicillins and oxycodone.  MEDICATIONS:  Current Outpatient Medications  Medication Sig Dispense Refill  . losartan-hydrochlorothiazide (HYZAAR) 100-12.5 MG tablet Take 1 tablet by mouth daily.    . diphenoxylate-atropine (LOMOTIL) 2.5-0.025 MG tablet 1 to 2 tablets 4 times daily as needed for diarrhea 40 tablet 1  . gabapentin (NEURONTIN) 100 MG capsule Take 1 capsule (100 mg total) by mouth at bedtime. 30 capsule 0  . lidocaine-prilocaine (EMLA) cream Apply 1 application topically as needed (chemo port). 30 g 0  . loratadine (CLARITIN) 10 MG tablet Take 10 mg by mouth daily.    Marland Kitchen LORazepam (ATIVAN) 0.5 MG tablet Place 1 tablet (0.5 mg total) under the tongue every 6 (six) hours as needed (Nausea). 30 tablet 1  . potassium chloride 20 MEQ TBCR Take 20 mEq by mouth daily. 7 tablet 0  . prochlorperazine (COMPAZINE) 10 MG tablet Take 1 tablet (10 mg total) by mouth every 6 (six) hours as needed for nausea or vomiting. 50 tablet 3  . rivaroxaban (XARELTO) 20 MG TABS tablet Take 1 tablet (20 mg total) by mouth daily with supper. 30 tablet 2  . traMADol (ULTRAM) 50 MG tablet Take 1 tablet (50 mg total) by mouth every 6 (six) hours as needed. 20 tablet 0   No current facility-administered medications for this  visit.     SURGICAL HISTORY:  Past Surgical History:  Procedure Laterality Date  . CESAREAN SECTION  1988  . HYSTEROSCOPY W/D&C  06/14/2011   Procedure: DILATATION AND CURETTAGE /HYSTEROSCOPY;  Surgeon: Frederico Hamman, MD;  Location: [facility];  Service: Gynecology;  Laterality: N/A;  . IR CV LINE INJECTION  06/25/2018  . PORTACATH PLACEMENT Left 06/13/2018   Procedure: INSERTION PORT-A-CATH;  Surgeon: [name], MD;  Location: [facility];  Service: Thoracic;  Laterality: Left;  . SUPRACLAVICAL NODE BIOPSY Right 05/10/2018   Procedure: SUPRACLAVICAL NODE BIOPSY;  Surgeon: [name], MD;  Location: [facility];  Service: Thoracic;  Laterality: Right;  . TUBAL LIGATION      REVIEW OF SYSTEMS:   Review of Systems  Constitutional: Negative for appetite change, chills, fatigue, fever and unexpected weight change.  HENT:   Negative for mouth sores, nosebleeds, sore throat and trouble swallowing.   Eyes: Negative for eye problems and icterus.  Respiratory: Positive for baseline productive cough and shortness of breath with exertion. Negative for hemoptysis and wheezing.   Cardiovascular: Negative for chest pain and leg swelling.  Gastrointestinal: Negative for abdominal pain, constipation, diarrhea, nausea and vomiting.  Genitourinary: Negative for bladder incontinence, difficulty urinating, dysuria, frequency and hematuria.   Musculoskeletal: Positive for chronic back pain. Negative for gait problem, neck pain and neck stiffness.  Skin: Negative for itching and rash.  Neurological: Negative for dizziness, extremity weakness, gait problem, headaches, light-headedness and seizures.  Hematological: Negative for adenopathy. Does not bruise/bleed easily.  Psychiatric/Behavioral: Negative for confusion, depression and sleep disturbance. The patient is not nervous/anxious.     PHYSICAL EXAMINATION:  Blood pressure (!) 145/108, pulse (!) 112, temperature 98 F (36.7 C), temperature source Temporal, resp. rate 18, height 5\' 7"  (1.702 m), weight 283 lb 8 oz (128.6 kg), SpO2 100 %.  ECOG PERFORMANCE STATUS: 1 - Symptomatic but completely ambulatory  Physical Exam  Constitutional: Oriented to person, place, and time and well-developed, well-nourished, and in no distress. HENT:  Head: Normocephalic and atraumatic.  Mouth/Throat: Oropharynx is clear and moist. No oropharyngeal exudate.  Eyes: Conjunctivae are  normal. Right eye exhibits no discharge. Left eye exhibits no discharge. No scleral icterus.  Neck: Normal range of motion. Neck supple.  Cardiovascular: Normal rate, regular rhythm, normal heart sounds and intact distal pulses.   Pulmonary/Chest: Effort normal. Decreased breath sounds in left lower lobe. No respiratory distress. No wheezes. No rales.  Abdominal: Soft. Bowel sounds are normal. Exhibits no distension and no mass. There is no tenderness.  Musculoskeletal: Normal range of motion. Exhibits no edema.  Lymphadenopathy:    Positive for right supraclavicular lymphadenopathy Neurological: Alert and oriented to person, place, and time. Exhibits normal muscle tone. Gait normal. Coordination normal.  Skin: Skin is warm and dry. No rash noted. Not diaphoretic. No erythema. No pallor.  Psychiatric: Mood, memory and judgment normal.  Vitals reviewed.  LABORATORY DATA: Lab Results  Component Value Date   WBC 3.0 (L) 11/26/2018   HGB 8.8 (L) 11/26/2018   HCT 27.4 (L) 11/26/2018   MCV 79.7 (L) 11/26/2018   PLT 70 (L) 11/26/2018      Chemistry      Component Value Date/Time   NA 141 11/26/2018 0812   NA 144 04/08/2018 1145   K 3.9 11/26/2018 0812   CL 104 11/26/2018 0812   CO2 28 11/26/2018 0812   BUN 14 11/26/2018 0812   BUN 10 04/08/2018 1145   CREATININE 1.03 (  H) 11/26/2018 0812      Component Value Date/Time   CALCIUM 8.8 (L) 11/26/2018 0812   ALKPHOS 90 11/26/2018 0812   AST 10 (L) 11/26/2018 0812   ALT 12 11/26/2018 0812   BILITOT 0.8 11/26/2018 0812       RADIOGRAPHIC STUDIES:  No results found.   ASSESSMENT/PLAN:  This isa very pleasant 58 year old African-American female with stage IV (T2AN3, M1C)high-grade neuroendocrine carcinoma with focal areas of small cell lung cancer. She was diagnosed in January 2020.  She previously underwent treatment with carboplatin for an AUC of 5 on day 1, etoposide 100 mg/m on days 1, 2, and 3 with Tecentriq and Neulasta  support. She is status post 7 cycles. Starting from cycle #5, the patient had been on maintenance tecentriq. This was discontinued due to evidence of disease progression.   She is currently undergoing systemic chemotherapy with cisplatin 30 mg/m and her undertaking 65 mg/m on days 1 and 8 every 3 weeks.  She is status post her first cycle of treatment.  Following her first cycle of treatment she experienced nausea, vomiting, and diarrhea.  This subsided the following day with the addition of fluid, ativan, and lomotil. She was also advised today to keep imodium at her home if needed for diarrhea. She also experienced neutropenia and electrolyte disturbances requiring granix x2 and IV potassium and magnesium. Growth factor support will be added on following day 8 of every cycle to prevent neutropenia and dose delays.   The patient was seen with Dr. Julien Nordmann today.  Labs were reviewed.  The patient's labs show thrombocytopenia today.  The patient denies any bleeding or bruising at this time.  The patient denies any use of any aspirin, ibuprofen, or Aleve.  We will delay the patient's treatment by 1 week.  We will see the patient back for follow-up visit in 1 week for evaluation before starting cycle #2.  The patient was advised to call immediately if she has any concerning symptoms in the interval. The patient voices understanding of current disease status and treatment options and is in agreement with the current care plan. All questions were answered. The patient knows to call the clinic with any problems, questions or concerns. We can certainly see the patient much sooner if necessary  No orders of the defined types were placed in this encounter.     L , PA-C 11/26/18  ADDENDUM: Hematology/Oncology Attending: I had a face-to-face encounter with the patient today.  I recommended her care plan.  This is a very pleasant 58 years old African-American female with a stage IV  high-grade neuroendocrine carcinoma with focal areas of small cell lung cancer.  She was treated with first-line chemotherapy with carboplatin and etoposide as well as Tecentriq status post 7 cycles.  She has initial partial response to this treatment followed by disease progression after cycle #7. The patient is currently undergoing second line treatment with reduced dose cisplatin and irinotecan status post 1 cycle.  She tolerated the first cycle well except for some nausea, vomiting and diarrhea but this significantly improved. She was supposed to start cycle #2 today but unfortunately she continues to have thrombocytopenia in addition to the mild neutropenia. We will delay her treatment to start next week after improvement of her platelets count. She will have Neulasta support after her treatment on day 8 of chemotherapy. The patient will come back for follow-up visit in 1 week for evaluation before resuming her treatment. She was advised to call immediately  if she has any concerning symptoms in the interval.  Disclaimer: This note was dictated with voice recognition software. Similar sounding words can inadvertently be transcribed and may be missed upon review. Eilleen Kempf, MD 11/26/18

## 2018-11-26 NOTE — Patient Instructions (Signed)
Call us with any questions or concerns. Have a great rest of your week!

## 2018-11-26 NOTE — Progress Notes (Signed)
Per Cassie PA-C, we will not treat with chemotherapy or fluids today. Port deacccessed by Therapist, sports. All questions answered and pt  knows to call with any questions or concerns until we see her again. New AVS/schedule given.

## 2018-12-02 ENCOUNTER — Other Ambulatory Visit: Payer: Medicaid Other

## 2018-12-02 ENCOUNTER — Ambulatory Visit: Payer: Medicaid Other

## 2018-12-02 ENCOUNTER — Ambulatory Visit: Payer: Medicaid Other | Admitting: Internal Medicine

## 2018-12-03 ENCOUNTER — Inpatient Hospital Stay (HOSPITAL_BASED_OUTPATIENT_CLINIC_OR_DEPARTMENT_OTHER): Payer: Medicaid Other | Admitting: Physician Assistant

## 2018-12-03 ENCOUNTER — Other Ambulatory Visit: Payer: Self-pay

## 2018-12-03 ENCOUNTER — Encounter: Payer: Self-pay | Admitting: Physician Assistant

## 2018-12-03 ENCOUNTER — Inpatient Hospital Stay: Payer: Medicaid Other

## 2018-12-03 VITALS — BP 133/99 | HR 102 | Temp 98.3°F | Resp 18 | Ht 67.0 in | Wt 282.5 lb

## 2018-12-03 VITALS — HR 95

## 2018-12-03 DIAGNOSIS — Z5111 Encounter for antineoplastic chemotherapy: Secondary | ICD-10-CM

## 2018-12-03 DIAGNOSIS — C3412 Malignant neoplasm of upper lobe, left bronchus or lung: Secondary | ICD-10-CM | POA: Diagnosis not present

## 2018-12-03 DIAGNOSIS — Z95828 Presence of other vascular implants and grafts: Secondary | ICD-10-CM

## 2018-12-03 LAB — CBC WITH DIFFERENTIAL (CANCER CENTER ONLY)
Abs Immature Granulocytes: 0.01 10*3/uL (ref 0.00–0.07)
Basophils Absolute: 0 10*3/uL (ref 0.0–0.1)
Basophils Relative: 0 %
Eosinophils Absolute: 0 10*3/uL (ref 0.0–0.5)
Eosinophils Relative: 0 %
HCT: 27.7 % — ABNORMAL LOW (ref 36.0–46.0)
Hemoglobin: 8.8 g/dL — ABNORMAL LOW (ref 12.0–15.0)
Immature Granulocytes: 0 %
Lymphocytes Relative: 49 %
Lymphs Abs: 1.6 10*3/uL (ref 0.7–4.0)
MCH: 25.9 pg — ABNORMAL LOW (ref 26.0–34.0)
MCHC: 31.8 g/dL (ref 30.0–36.0)
MCV: 81.5 fL (ref 80.0–100.0)
Monocytes Absolute: 0.3 10*3/uL (ref 0.1–1.0)
Monocytes Relative: 9 %
Neutro Abs: 1.4 10*3/uL — ABNORMAL LOW (ref 1.7–7.7)
Neutrophils Relative %: 42 %
Platelet Count: 138 10*3/uL — ABNORMAL LOW (ref 150–400)
RBC: 3.4 MIL/uL — ABNORMAL LOW (ref 3.87–5.11)
RDW: 22.8 % — ABNORMAL HIGH (ref 11.5–15.5)
WBC Count: 3.4 10*3/uL — ABNORMAL LOW (ref 4.0–10.5)
nRBC: 0 % (ref 0.0–0.2)

## 2018-12-03 LAB — CMP (CANCER CENTER ONLY)
ALT: 11 U/L (ref 0–44)
AST: 9 U/L — ABNORMAL LOW (ref 15–41)
Albumin: 3.5 g/dL (ref 3.5–5.0)
Alkaline Phosphatase: 99 U/L (ref 38–126)
Anion gap: 10 (ref 5–15)
BUN: 12 mg/dL (ref 6–20)
CO2: 26 mmol/L (ref 22–32)
Calcium: 8.8 mg/dL — ABNORMAL LOW (ref 8.9–10.3)
Chloride: 107 mmol/L (ref 98–111)
Creatinine: 1.08 mg/dL — ABNORMAL HIGH (ref 0.44–1.00)
GFR, Est AFR Am: >60 mL/min (ref >60)
GFR, Estimated: 57 mL/min — ABNORMAL LOW (ref >60)
Glucose, Bld: 99 mg/dL (ref 70–99)
Potassium: 3.7 mmol/L (ref 3.5–5.1)
Sodium: 143 mmol/L (ref 135–145)
Total Bilirubin: 0.6 mg/dL (ref 0.3–1.2)
Total Protein: 7.5 g/dL (ref 6.5–8.1)

## 2018-12-03 LAB — MAGNESIUM: Magnesium: 1.7 mg/dL (ref 1.7–2.4)

## 2018-12-03 MED ORDER — IRINOTECAN HCL CHEMO INJECTION 100 MG/5ML
50.0000 mg/m2 | Freq: Once | INTRAVENOUS | Status: AC
  Administered 2018-12-03: 13:00:00 120 mg via INTRAVENOUS
  Filled 2018-12-03: qty 6

## 2018-12-03 MED ORDER — PALONOSETRON HCL INJECTION 0.25 MG/5ML
INTRAVENOUS | Status: AC
  Filled 2018-12-03: qty 5

## 2018-12-03 MED ORDER — RIVAROXABAN 20 MG PO TABS: 20.0000 mg | ORAL_TABLET | Freq: Every day | ORAL | 2 refills | Status: DC

## 2018-12-03 MED ORDER — SODIUM CHLORIDE 0.9% FLUSH
10.0000 mL | INTRAVENOUS | Status: DC | PRN
  Administered 2018-12-03: 17:00:00 10 mL
  Filled 2018-12-03: qty 10

## 2018-12-03 MED ORDER — SODIUM CHLORIDE 0.9 % IV SOLN
25.0000 mg/m2 | Freq: Once | INTRAVENOUS | Status: AC
  Administered 2018-12-03: 15:00:00 62 mg via INTRAVENOUS
  Filled 2018-12-03: qty 62

## 2018-12-03 MED ORDER — SODIUM CHLORIDE 0.9 % IV SOLN
Freq: Once | INTRAVENOUS | Status: AC
  Administered 2018-12-03: 10:00:00 via INTRAVENOUS
  Filled 2018-12-03: qty 250

## 2018-12-03 MED ORDER — POTASSIUM CHLORIDE 2 MEQ/ML IV SOLN
Freq: Once | INTRAVENOUS | Status: AC
  Administered 2018-12-03: 10:00:00 via INTRAVENOUS
  Filled 2018-12-03: qty 10

## 2018-12-03 MED ORDER — SODIUM CHLORIDE 0.9% FLUSH
10.0000 mL | INTRAVENOUS | Status: DC | PRN
  Administered 2018-12-03: 10 mL
  Filled 2018-12-03: qty 10

## 2018-12-03 MED ORDER — HEPARIN SOD (PORK) LOCK FLUSH 100 UNIT/ML IV SOLN
500.0000 [IU] | Freq: Once | INTRAVENOUS | Status: AC | PRN
  Administered 2018-12-03: 500 [IU]
  Filled 2018-12-03: qty 5

## 2018-12-03 MED ORDER — SODIUM CHLORIDE 0.9 % IV SOLN
Freq: Once | INTRAVENOUS | Status: AC
  Administered 2018-12-03: 12:00:00 via INTRAVENOUS
  Filled 2018-12-03: qty 5

## 2018-12-03 MED ORDER — ATROPINE SULFATE 1 MG/ML IJ SOLN: 0.5000 mg | Freq: Once | INTRAMUSCULAR | Status: DC | PRN

## 2018-12-03 MED ORDER — PALONOSETRON HCL INJECTION 0.25 MG/5ML
0.2500 mg | Freq: Once | INTRAVENOUS | Status: AC
  Administered 2018-12-03: 12:00:00 0.25 mg via INTRAVENOUS

## 2018-12-03 NOTE — Progress Notes (Signed)
Ok to treat with ANC 1.4 per Cassie H, PA.  Dose to be reduced.

## 2018-12-03 NOTE — Progress Notes (Signed)
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  PRIOR THERAPY: 1)Carboplatin for AUC of 5 on day 1, etoposide 100 mg/M2 on days 1, 2 and 3 with Neulasta support in addition to [place].First dose February 10th, 2020. Status post4cycles. 2)Maintenance treatment with Tecentriq 1200 mg IV every 3 weeks. First dose Sep 10, 2018. Status post 3cycles.This was discontinued secondary to disease progression.  CURRENT THERAPY: Systemic chemotherapy with cisplatin 30 mg/M2 and irinotecan 65 mg/M2 on days 1 and 8 every 3 weeks. First dose October 28, 2018. Status post 1 cycle of treatment.Starting from cycle #2, her dose will be reduced to Cisplatin 25 mg/m2 and Irinotecan 50 mg/m2 on days 1 and 8 every 3 weeks with neulasta support.   INTERVAL HISTORY: Samantha Santos 58 y.o. female returns to the clinic for a follow up visit. Cycle #2 of the patient's current treatment has been delayed for the last two weeks secondary to neutropenia requiring two granix injections and thrombocytopenia. Growth factor support with neulasta will be added to day 8 of every cycle going forward. She also received IV hydration with additional potassium and magnesium 2 weeks ago for her electrolyte abnormalities.   Otherwise, the patient is feeling well today. She denies any fevers, chills, night sweats, or weight loss. She denies any nausea, vomiting, diarrhea, or constipation. She denies any headaches or visual changes. She denies any chest pain, cough, shortness of breath, or hemoptysis. She is here today for eventuation and repeat blood work before starting cycle #2.   MEDICAL HISTORY: Past Medical History:  Diagnosis Date  . Anemia   . Arthritis   . GERD (gastroesophageal reflux disease)   . Hypertension   .  Morbid obesity ([place]) 05/06/2018  . Supraclavicular adenopathy     ALLERGIES:  is allergic to penicillins and oxycodone.  MEDICATIONS:  Current Outpatient Medications  Medication Sig Dispense Refill  . diphenoxylate-atropine (LOMOTIL) 2.5-0.025 MG tablet 1 to 2 tablets 4 times daily as needed for diarrhea 40 tablet 1  . gabapentin (NEURONTIN) 100 MG capsule Take 1 capsule (100 mg total) by mouth at bedtime. 30 capsule 0  . loratadine (CLARITIN) 10 MG tablet Take 10 mg by mouth daily.    Marland Kitchen LORazepam (ATIVAN) 0.5 MG tablet Place 1 tablet (0.5 mg total) under the tongue every 6 (six) hours as needed (Nausea). 30 tablet 1  . losartan-hydrochlorothiazide (HYZAAR) 100-12.5 MG tablet Take 1 tablet by mouth daily.    . potassium chloride 20 MEQ TBCR Take 20 mEq by mouth daily. 7 tablet 0  . prochlorperazine (COMPAZINE) 10 MG tablet Take 1 tablet (10 mg total) by mouth every 6 (six) hours as needed for nausea or vomiting. 50 tablet 3  . rivaroxaban (XARELTO) 20 MG TABS tablet Take 1 tablet (20 mg total) by mouth daily with supper. 30 tablet 2  . traMADol (ULTRAM) 50 MG tablet Take 1 tablet (50 mg total) by mouth every 6 (six) hours as needed. 20 tablet 0  . lidocaine-prilocaine (EMLA) cream Apply 1 application topically as needed (chemo port). (Patient not taking: Reported on 12/03/2018) 30 g 0   No current facility-administered medications for this visit.     SURGICAL HISTORY:  Past Surgical History:  Procedure Laterality Date  . CESAREAN SECTION  1988  . HYSTEROSCOPY W/D&C  06/14/2011  Procedure: DILATATION AND CURETTAGE /HYSTEROSCOPY;  Surgeon: Frederico Hamman, MD;  Location: [facility];  Service: Gynecology;  Laterality: N/A;  . IR CV LINE INJECTION  06/25/2018  . PORTACATH PLACEMENT Left 06/13/2018   Procedure: INSERTION PORT-A-CATH;  Surgeon: [name], MD;  Location: [facility];  Service: Thoracic;  Laterality: Left;  . SUPRACLAVICAL NODE BIOPSY Right 05/10/2018   Procedure:  SUPRACLAVICAL NODE BIOPSY;  Surgeon: [name], MD;  Location: [facility];  Service: Thoracic;  Laterality: Right;  . TUBAL LIGATION      REVIEW OF SYSTEMS:   Review of Systems  Constitutional: Negative for appetite change, chills, fatigue, fever and unexpected weight change.  HENT:   Negative for mouth sores, nosebleeds, sore throat and trouble swallowing.   Eyes: Negative for eye problems and icterus.  Respiratory: Positive for baseline productive cough and shortness of breath with exertion.Negative for hemoptysis and wheezing.   Cardiovascular: Negative for chest pain and leg swelling.  Gastrointestinal: Negative for abdominal pain, constipation, diarrhea, nausea and vomiting.  Genitourinary: Negative for bladder incontinence, difficulty urinating, dysuria, frequency and hematuria.   Musculoskeletal: Positive for chronic back pain. Negative for gait problem, neck pain and neck stiffness.  Skin: Negative for itching and rash.  Neurological: Negative for dizziness, extremity weakness, gait problem, headaches, light-headedness and seizures.  Hematological: Negative for adenopathy. Does not bruise/bleed easily.  Psychiatric/Behavioral: Negative for confusion, depression and sleep disturbance. The patient is not nervous/anxious.      PHYSICAL EXAMINATION:  Blood pressure (!) 133/99, pulse (!) 102, temperature 98.3 F (36.8 C), temperature source Oral, resp. rate 18, height 5\' 7"  (1.702 m), weight 282 lb 8 oz (128.1 kg), SpO2 100 %.  ECOG PERFORMANCE STATUS: 1 - Symptomatic but completely ambulatory  Physical Exam  Constitutional: Oriented to person, place, and time and well-developed, well-nourished, and in no distress. HENT:  Head: Normocephalic and atraumatic.  Mouth/Throat: Oropharynx is clear and moist. No oropharyngeal exudate.  Eyes: Conjunctivae are normal. Right eye exhibits no discharge. Left eye exhibits no discharge. No scleral icterus.  Neck: Normal range of motion.  Neck supple.  Cardiovascular: Normal rate, regular rhythm, normal heart sounds and intact distal pulses.   Pulmonary/Chest: Effort normal. Decreased breath sounds in left lower lobe. No respiratory distress. No wheezes. No rales.  Abdominal: Soft. Bowel sounds are normal. Exhibits no distension and no mass. There is no tenderness.  Musculoskeletal: Normal range of motion. Exhibits no edema.  Lymphadenopathy:    Positive for right supraclavicular lymphadenopathy Neurological: Alert and oriented to person, place, and time. Exhibits normal muscle tone. Gait normal. Coordination normal.  Skin: Skin is warm and dry. No rash noted. Not diaphoretic. No erythema. No pallor.  Psychiatric: Mood, memory and judgment normal.  Vitals reviewed.  LABORATORY DATA: Lab Results  Component Value Date   WBC 3.4 (L) 12/03/2018   HGB 8.8 (L) 12/03/2018   HCT 27.7 (L) 12/03/2018   MCV 81.5 12/03/2018   PLT 138 (L) 12/03/2018      Chemistry      Component Value Date/Time   NA 143 12/03/2018 0752   NA 144 04/08/2018 1145   K 3.7 12/03/2018 0752   CL 107 12/03/2018 0752   CO2 26 12/03/2018 0752   BUN 12 12/03/2018 0752   BUN 10 04/08/2018 1145   CREATININE 1.08 (H) 12/03/2018 0752      Component Value Date/Time   CALCIUM 8.8 (L) 12/03/2018 0752   ALKPHOS 99 12/03/2018 0752   AST 9 (L) 12/03/2018  0752   ALT 11 12/03/2018 0752   BILITOT 0.6 12/03/2018 0752       RADIOGRAPHIC STUDIES:  No results found.   ASSESSMENT/PLAN:  This isa very pleasant 58 year old African-American female with stage IV (T2a, N3, M1C)high-grade neuroendocrine carcinoma with focal areas of small cell lung cancer. She was diagnosed in January 2020.  Shepreviously underwenttreatment with carboplatin for an AUC of 5 on day 1, etoposide 100 mg/m on days 1, 2, and 3 with Tecentriq and Neulasta support. She is status post7cycles. Starting from cycle #5, the patient had been on maintenance tecentriq. This was  discontinued due to evidence of disease progression.   She is currently undergoing systemic chemotherapy withcisplatin 30 mg/m and irinotecan 65 mg/m on days 1 and 8 every 3 weeks. She is status post her first cycle of treatment. Following her first cycle of treatment she experienced nausea, vomiting, and diarrhea. This subsided the following day with the addition of fluid, ativan, and lomotil. She also experienced neutropenia, thrombocytopenia, and electrolyte disturbances requiring granix x2 and IV potassium and magnesium. Growth factor support will be added on following day 8 of every cycle to prevent neutropenia and dose delays.   The patient was seen with Dr. Julien Nordmann today.  Labs were reviewed.  The patient's labs are adequate for treatment today. She will receive day 1 of cycle 2 today as scheduled. Dr. Julien Nordmann will reduce her dose of Cisplatin to 25 mg/m2 and Irinotecan 50 mg/m2 to prevent her blood counts from dropping significantly.   I will arrange for the patient to have a restaging CT scan of the chest, abdomen, and pelvis performed prior to her next appointment in 3 weeks.   We will see the patient back for follow-up visit in 3 weeks for evaluation and to review her scan results before starting cycle #3  The patient has imodium, lomotil, and compazine should she develop nausea and vomiting from treatment today.   The patient was advised to call immediately if she has any concerning symptoms in the interval. The patient voices understanding of current disease status and treatment options and is in agreement with the current care plan. All questions were answered. The patient knows to call the clinic with any problems, questions or concerns. We can certainly see the patient much sooner if necessary Orders Placed This Encounter  Procedures  . CT Chest W Contrast    Standing Status:   Future    Standing Expiration Date:   12/03/2019    Order Specific Question:   ** REASON FOR  EXAM (FREE TEXT)    Answer:   Restaging Lung Cancer    Order Specific Question:   If indicated for the ordered procedure, I authorize the administration of contrast media per Radiology protocol    Answer:   Yes    Order Specific Question:   Is patient pregnant?    Answer:   No    Order Specific Question:   Preferred imaging location?    Answer:   [facility]    Order Specific Question:   Radiology Contrast Protocol - do NOT remove file path    Answer:   \\[email or web address]  . CT Abdomen Pelvis W Contrast    Standing Status:   Future    Standing Expiration Date:   12/03/2019    Order Specific Question:   ** REASON FOR EXAM (FREE TEXT)    Answer:   Restaging Lung Cancer    Order Specific Question:   If indicated for  the ordered procedure, I authorize the administration of contrast media per Radiology protocol    Answer:   Yes    Order Specific Question:   Is patient pregnant?    Answer:   No    Order Specific Question:   Preferred imaging location?    Answer:   [facility]    Order Specific Question:   Is Oral Contrast requested for this exam?    Answer:   Yes, Per Radiology protocol    Order Specific Question:   Radiology Contrast Protocol - do NOT remove file path    Answer:   [email or web address]     Nelta Caudill L Neely Kammerer, PA-C 12/03/18  ADDENDUM: Hematology/Oncology Attending: I had a face-to-face encounter with the patient today.  I recommended her care plan.  This is a very pleasant 58 years old African-American female with metastatic high-grade neuroendocrine carcinoma status post induction systemic chemotherapy with carboplatin, etoposide and Tecentriq status post 7 cycles and her treatment was discontinued secondary to disease progression. She is currently undergoing second line treatment with cisplatin and irinotecan status post 1 cycle.  She has significant pancytopenia after cycle #2.  Her count is much  better today.  I recommended for the patient to proceed with cycle #2 but I will reduce the dose of cisplatin to 25 mg/M2 and irinotecan to 50 mg/M2 on days 1 and 8 every 3 weeks. The patient will also receive Neulasta or alternative on day 9 of her treatment. She will come back for follow-up visit in 3 weeks for evaluation before the next cycle of her treatment. She was advised to call immediately if she has any other concerning symptoms in the interval.  Disclaimer: This note was dictated with voice recognition software. Similar sounding words can inadvertently be transcribed and may be missed upon review. Eilleen Kempf, MD 12/03/18

## 2018-12-03 NOTE — Patient Instructions (Signed)
[facility] Discharge Instructions for Patients Receiving Chemotherapy  Today you received the following chemotherapy agents:  Irinotecan, Cisplatin  To help prevent nausea and vomiting after your treatment, we encourage you to take your nausea medication as prescribed.   If you develop nausea and vomiting that is not controlled by your nausea medication, call the clinic.   BELOW ARE SYMPTOMS THAT SHOULD BE REPORTED IMMEDIATELY:  *FEVER GREATER THAN 100.5 F  *CHILLS WITH OR WITHOUT FEVER  NAUSEA AND VOMITING THAT IS NOT CONTROLLED WITH YOUR NAUSEA MEDICATION  *UNUSUAL SHORTNESS OF BREATH  *UNUSUAL BRUISING OR BLEEDING  TENDERNESS IN MOUTH AND THROAT WITH OR WITHOUT PRESENCE OF ULCERS  *URINARY PROBLEMS  *BOWEL PROBLEMS  UNUSUAL RASH Items with * indicate a potential emergency and should be followed up as soon as possible.  Feel free to call the clinic should you have any questions or concerns. The clinic phone number is [phone].  Please show the [place] at check-in to the Emergency Department and triage nurse.  Irinotecan injection What is this medicine? IRINOTECAN (ir in oh TEE kan ) is a chemotherapy drug. It is used to treat colon and rectal cancer. This medicine may be used for other purposes; ask your health care provider or pharmacist if you have questions. COMMON BRAND NAME(S): Camptosar What should I tell my health care provider before I take this medicine? They need to know if you have any of these conditions:  dehydration  diarrhea  infection (especially a virus infection such as chickenpox, cold sores, or herpes)  liver disease  low blood counts, like low white cell, platelet, or red cell counts  low levels of calcium, magnesium, or potassium in the blood  recent or ongoing radiation therapy  an unusual or allergic reaction to irinotecan, other medicines, foods, dyes, or preservatives  pregnant or trying to get  pregnant  breast-feeding How should I use this medicine? This drug is given as an infusion into a vein. It is administered in a hospital or clinic by a specially trained health care professional. Talk to your pediatrician regarding the use of this medicine in children. Special care may be needed. Overdosage: If you think you have taken too much of this medicine contact a poison control center or emergency room at once. NOTE: This medicine is only for you. Do not share this medicine with others. What if I miss a dose? It is important not to miss your dose. Call your doctor or health care professional if you are unable to keep an appointment. What may interact with this medicine? This medicine may interact with the following medications:  antiviral medicines for HIV or AIDS  certain antibiotics like rifampin or rifabutin  certain medicines for fungal infections like itraconazole, ketoconazole, posaconazole, and voriconazole  certain medicines for seizures like carbamazepine, phenobarbital, phenotoin  clarithromycin  gemfibrozil  nefazodone  St. John's Wort This list may not describe all possible interactions. Give your health care provider a list of all the medicines, herbs, non-prescription drugs, or dietary supplements you use. Also tell them if you smoke, drink alcohol, or use illegal drugs. Some items may interact with your medicine. What should I watch for while using this medicine? Your condition will be monitored carefully while you are receiving this medicine. You will need important blood work done while you are taking this medicine. This drug may make you feel generally unwell. This is not uncommon, as chemotherapy can affect healthy cells as well as cancer  cells. Report any side effects. Continue your course of treatment even though you feel ill unless your doctor tells you to stop. In some cases, you may be given additional medicines to help with side effects. Follow all  directions for their use. You may get drowsy or dizzy. Do not drive, use machinery, or do anything that needs mental alertness until you know how this medicine affects you. Do not stand or sit up quickly, especially if you are an older patient. This reduces the risk of dizzy or fainting spells. Call your health care professional for advice if you get a fever, chills, or sore throat, or other symptoms of a cold or flu. Do not treat yourself. This medicine decreases your body's ability to fight infections. Try to avoid being around people who are sick. Avoid taking products that contain aspirin, acetaminophen, ibuprofen, naproxen, or ketoprofen unless instructed by your doctor. These medicines may hide a fever. This medicine may increase your risk to bruise or bleed. Call your doctor or health care professional if you notice any unusual bleeding. Be careful brushing and flossing your teeth or using a toothpick because you may get an infection or bleed more easily. If you have any dental work done, tell your dentist you are receiving this medicine. Do not become pregnant while taking this medicine or for 6 months after stopping it. Women should inform their health care professional if they wish to become pregnant or think they might be pregnant. Men should not father a child while taking this medicine and for 3 months after stopping it. There is potential for serious side effects to an unborn child. Talk to your health care professional for more information. Do not breast-feed an infant while taking this medicine or for 7 days after stopping it. This medicine has caused ovarian failure in some women. This medicine may make it more difficult to get pregnant. Talk to your health care professional if you are concerned about your fertility. This medicine has caused decreased sperm counts in some men. This may make it more difficult to father a child. Talk to your health care professional if you are concerned about  your fertility. What side effects may I notice from receiving this medicine? Side effects that you should report to your doctor or health care professional as soon as possible:  allergic reactions like skin rash, itching or hives, swelling of the face, lips, or tongue  chest pain  diarrhea  flushing, runny nose, sweating during infusion  low blood counts - this medicine may decrease the number of white blood cells, red blood cells and platelets. You may be at increased risk for infections and bleeding.  nausea, vomiting  pain, swelling, warmth in the leg  signs of decreased platelets or bleeding - bruising, pinpoint red spots on the skin, black, tarry stools, blood in the urine  signs of infection - fever or chills, cough, sore throat, pain or difficulty passing urine  signs of decreased red blood cells - unusually weak or tired, fainting spells, lightheadedness Side effects that usually do not require medical attention (report to your doctor or health care professional if they continue or are bothersome):  constipation  hair loss  headache  loss of appetite  mouth sores  stomach pain This list may not describe all possible side effects. Call your doctor for medical advice about side effects. You may report side effects to FDA at 1-800-FDA-1088. Where should I keep my medicine? This drug is given in a hospital  or clinic and will not be stored at home. NOTE: This sheet is a summary. It may not cover all possible information. If you have questions about this medicine, talk to your doctor, pharmacist, or health care provider.  2020 Elsevier/Gold Standard (2018-05-24 10:09:17)  Cisplatin injection What is this medicine? CISPLATIN (SIS pla tin) is a chemotherapy drug. It targets fast dividing cells, like cancer cells, and causes these cells to die. This medicine is used to treat many types of cancer like bladder, ovarian, and testicular cancers. This medicine may be used for  other purposes; ask your health care provider or pharmacist if you have questions. COMMON BRAND NAME(S): Platinol, Platinol -AQ What should I tell my health care provider before I take this medicine? They need to know if you have any of these conditions:  blood disorders  hearing problems  kidney disease  recent or ongoing radiation therapy  an unusual or allergic reaction to cisplatin, carboplatin, other chemotherapy, other medicines, foods, dyes, or preservatives  pregnant or trying to get pregnant  breast-feeding How should I use this medicine? This drug is given as an infusion into a vein. It is administered in a hospital or clinic by a specially trained health care professional. Talk to your pediatrician regarding the use of this medicine in children. Special care may be needed. Overdosage: If you think you have taken too much of this medicine contact a poison control center or emergency room at once. NOTE: This medicine is only for you. Do not share this medicine with others. What if I miss a dose? It is important not to miss a dose. Call your doctor or health care professional if you are unable to keep an appointment. What may interact with this medicine?  dofetilide  foscarnet  medicines for seizures  medicines to increase blood counts like filgrastim, pegfilgrastim, sargramostim  probenecid  pyridoxine used with altretamine  rituximab  some antibiotics like amikacin, gentamicin, neomycin, polymyxin B, streptomycin, tobramycin  sulfinpyrazone  vaccines  zalcitabine Talk to your doctor or health care professional before taking any of these medicines:  acetaminophen  aspirin  ibuprofen  ketoprofen  naproxen This list may not describe all possible interactions. Give your health care provider a list of all the medicines, herbs, non-prescription drugs, or dietary supplements you use. Also tell them if you smoke, drink alcohol, or use illegal drugs. Some  items may interact with your medicine. What should I watch for while using this medicine? Your condition will be monitored carefully while you are receiving this medicine. You will need important blood work done while you are taking this medicine. This drug may make you feel generally unwell. This is not uncommon, as chemotherapy can affect healthy cells as well as cancer cells. Report any side effects. Continue your course of treatment even though you feel ill unless your doctor tells you to stop. In some cases, you may be given additional medicines to help with side effects. Follow all directions for their use. Call your doctor or health care professional for advice if you get a fever, chills or sore throat, or other symptoms of a cold or flu. Do not treat yourself. This drug decreases your body's ability to fight infections. Try to avoid being around people who are sick. This medicine may increase your risk to bruise or bleed. Call your doctor or health care professional if you notice any unusual bleeding. Be careful brushing and flossing your teeth or using a toothpick because you may get an infection  or bleed more easily. If you have any dental work done, tell your dentist you are receiving this medicine. Avoid taking products that contain aspirin, acetaminophen, ibuprofen, naproxen, or ketoprofen unless instructed by your doctor. These medicines may hide a fever. Do not become pregnant while taking this medicine. Women should inform their doctor if they wish to become pregnant or think they might be pregnant. There is a potential for serious side effects to an unborn child. Talk to your health care professional or pharmacist for more information. Do not breast-feed an infant while taking this medicine. Drink fluids as directed while you are taking this medicine. This will help protect your kidneys. Call your doctor or health care professional if you get diarrhea. Do not treat yourself. What side  effects may I notice from receiving this medicine? Side effects that you should report to your doctor or health care professional as soon as possible:  allergic reactions like skin rash, itching or hives, swelling of the face, lips, or tongue  signs of infection - fever or chills, cough, sore throat, pain or difficulty passing urine  signs of decreased platelets or bleeding - bruising, pinpoint red spots on the skin, black, tarry stools, nosebleeds  signs of decreased red blood cells - unusually weak or tired, fainting spells, lightheadedness  breathing problems  changes in hearing  gout pain  low blood counts - This drug may decrease the number of white blood cells, red blood cells and platelets. You may be at increased risk for infections and bleeding.  nausea and vomiting  pain, swelling, redness or irritation at the injection site  pain, tingling, numbness in the hands or feet  problems with balance, movement  trouble passing urine or change in the amount of urine Side effects that usually do not require medical attention (report to your doctor or health care professional if they continue or are bothersome):  changes in vision  loss of appetite  metallic taste in the mouth or changes in taste This list may not describe all possible side effects. Call your doctor for medical advice about side effects. You may report side effects to FDA at 1-800-FDA-1088. Where should I keep my medicine? This drug is given in a hospital or clinic and will not be stored at home. NOTE: This sheet is a summary. It may not cover all possible information. If you have questions about this medicine, talk to your doctor, pharmacist, or health care provider.  2020 Elsevier/Gold Standard (2007-07-09 14:40:54)  Coronavirus (COVID-19) Are you at risk?  Are you at risk for the Coronavirus (COVID-19)?  To be considered HIGH RISK for Coronavirus (COVID-19), you have to meet the following  criteria:  . Traveled to [place], [place], [place], [place] or [place]; or in the [place] to [place], [place], [place], or [place]; and have fever, cough, and shortness of breath within the last 2 weeks of travel OR . Been in close contact with a person diagnosed with COVID-19 within the last 2 weeks and have fever, cough, and shortness of breath . IF YOU DO NOT MEET THESE CRITERIA, YOU ARE CONSIDERED LOW RISK FOR COVID-19.  What to do if you are HIGH RISK for COVID-19?  Marland Kitchen If you are having a medical emergency, call 911. . Seek medical care right away. Before you go to a doctor's office, urgent care or emergency department, call ahead and tell them about your recent travel, contact with someone diagnosed with COVID-19, and your symptoms. You should  receive instructions from your physician's office regarding next steps of care.  . When you arrive at healthcare provider, tell the healthcare staff immediately you have returned from visiting [place], [place], [place], [place] or [place]; or traveled in the [place] to [place], [place], [place], or [place]; in the last two weeks or you have been in close contact with a person diagnosed with COVID-19 in the last 2 weeks.   . Tell the health care staff about your symptoms: fever, cough and shortness of breath. . After you have been seen by a medical provider, you will be either: o Tested for (COVID-19) and discharged home on quarantine except to seek medical care if symptoms worsen, and asked to  - Stay home and avoid contact with others until you get your results (4-5 days)  - Avoid travel on public transportation if possible (such as bus, train, or airplane) or o Sent to the Emergency Department by EMS for evaluation, COVID-19 testing, and possible admission depending on your condition and test results.  What to do if you are LOW RISK for COVID-19?  Reduce your risk of any infection by using the same precautions used for  avoiding the common cold or flu:  Marland Kitchen Wash your hands often with soap and warm water for at least 20 seconds.  If soap and water are not readily available, use an alcohol-based hand sanitizer with at least 60% alcohol.  . If coughing or sneezing, cover your mouth and nose by coughing or sneezing into the elbow areas of your shirt or coat, into a tissue or into your sleeve (not your hands). . Avoid shaking hands with others and consider head nods or verbal greetings only. . Avoid touching your eyes, nose, or mouth with unwashed hands.  . Avoid close contact with people who are sick. . Avoid places or events with large numbers of people in one location, like concerts or sporting events. . Carefully consider travel plans you have or are making. . If you are planning any travel outside or inside the [place], visit the CDC's Travelers' Health webpage for the latest health notices. . If you have some symptoms but not all symptoms, continue to monitor at home and seek medical attention if your symptoms worsen. . If you are having a medical emergency, call 911.   [facility] / e-Visit: [email or web address]         [facility]: [facility]: [phone]                   [facility]: [phone]

## 2018-12-04 ENCOUNTER — Telehealth: Payer: Self-pay | Admitting: Medical Oncology

## 2018-12-04 NOTE — Telephone Encounter (Signed)
Asking re EMLA PA. Requested assistance from [employer]

## 2018-12-09 ENCOUNTER — Ambulatory Visit: Payer: Medicaid Other | Admitting: Internal Medicine

## 2018-12-09 ENCOUNTER — Ambulatory Visit: Payer: Medicaid Other

## 2018-12-09 ENCOUNTER — Other Ambulatory Visit: Payer: Medicaid Other

## 2018-12-10 ENCOUNTER — Ambulatory Visit: Payer: Medicaid Other

## 2018-12-10 ENCOUNTER — Other Ambulatory Visit: Payer: Medicaid Other

## 2018-12-10 ENCOUNTER — Inpatient Hospital Stay: Payer: Medicaid Other

## 2018-12-10 ENCOUNTER — Other Ambulatory Visit: Payer: Self-pay

## 2018-12-10 ENCOUNTER — Ambulatory Visit: Payer: Medicaid Other | Admitting: Internal Medicine

## 2018-12-10 VITALS — BP 117/89 | HR 87 | Temp 98.7°F | Resp 20

## 2018-12-10 DIAGNOSIS — Z95828 Presence of other vascular implants and grafts: Secondary | ICD-10-CM

## 2018-12-10 DIAGNOSIS — C3412 Malignant neoplasm of upper lobe, left bronchus or lung: Secondary | ICD-10-CM

## 2018-12-10 DIAGNOSIS — Z5111 Encounter for antineoplastic chemotherapy: Secondary | ICD-10-CM | POA: Diagnosis not present

## 2018-12-10 LAB — CMP (CANCER CENTER ONLY)
ALT: 11 U/L (ref 0–44)
AST: 8 U/L — ABNORMAL LOW (ref 15–41)
Albumin: 3.3 g/dL — ABNORMAL LOW (ref 3.5–5.0)
Alkaline Phosphatase: 89 U/L (ref 38–126)
Anion gap: 11 (ref 5–15)
BUN: 16 mg/dL (ref 6–20)
CO2: 25 mmol/L (ref 22–32)
Calcium: 8.4 mg/dL — ABNORMAL LOW (ref 8.9–10.3)
Chloride: 107 mmol/L (ref 98–111)
Creatinine: 1.18 mg/dL — ABNORMAL HIGH (ref 0.44–1.00)
GFR, Est AFR Am: 59 mL/min — ABNORMAL LOW (ref >60)
GFR, Estimated: 51 mL/min — ABNORMAL LOW (ref >60)
Glucose, Bld: 138 mg/dL — ABNORMAL HIGH (ref 70–99)
Potassium: 3.7 mmol/L (ref 3.5–5.1)
Sodium: 143 mmol/L (ref 135–145)
Total Bilirubin: 0.5 mg/dL (ref 0.3–1.2)
Total Protein: 7.1 g/dL (ref 6.5–8.1)

## 2018-12-10 LAB — CBC WITH DIFFERENTIAL (CANCER CENTER ONLY)
Abs Immature Granulocytes: 0.04 10*3/uL (ref 0.00–0.07)
Basophils Absolute: 0 10*3/uL (ref 0.0–0.1)
Basophils Relative: 0 %
Eosinophils Absolute: 0 10*3/uL (ref 0.0–0.5)
Eosinophils Relative: 0 %
HCT: 27 % — ABNORMAL LOW (ref 36.0–46.0)
Hemoglobin: 8.6 g/dL — ABNORMAL LOW (ref 12.0–15.0)
Immature Granulocytes: 1 %
Lymphocytes Relative: 46 %
Lymphs Abs: 1.9 10*3/uL (ref 0.7–4.0)
MCH: 25.8 pg — ABNORMAL LOW (ref 26.0–34.0)
MCHC: 31.9 g/dL (ref 30.0–36.0)
MCV: 81.1 fL (ref 80.0–100.0)
Monocytes Absolute: 0.3 10*3/uL (ref 0.1–1.0)
Monocytes Relative: 9 %
Neutro Abs: 1.8 10*3/uL (ref 1.7–7.7)
Neutrophils Relative %: 44 %
Platelet Count: 194 10*3/uL (ref 150–400)
RBC: 3.33 MIL/uL — ABNORMAL LOW (ref 3.87–5.11)
RDW: 23.2 % — ABNORMAL HIGH (ref 11.5–15.5)
WBC Count: 4 10*3/uL (ref 4.0–10.5)
nRBC: 0 % (ref 0.0–0.2)

## 2018-12-10 MED ORDER — SODIUM CHLORIDE 0.9 % IV SOLN
Freq: Once | INTRAVENOUS | Status: AC
  Administered 2018-12-10: 12:00:00 via INTRAVENOUS
  Filled 2018-12-10: qty 5

## 2018-12-10 MED ORDER — ATROPINE SULFATE 1 MG/ML IJ SOLN
INTRAMUSCULAR | Status: AC
  Filled 2018-12-10: qty 1

## 2018-12-10 MED ORDER — SODIUM CHLORIDE 0.9% FLUSH
10.0000 mL | INTRAVENOUS | Status: DC | PRN
  Administered 2018-12-10: 10 mL
  Filled 2018-12-10: qty 10

## 2018-12-10 MED ORDER — IRINOTECAN HCL CHEMO INJECTION 100 MG/5ML
50.0000 mg/m2 | Freq: Once | INTRAVENOUS | Status: AC
  Administered 2018-12-10: 120 mg via INTRAVENOUS
  Filled 2018-12-10: qty 6

## 2018-12-10 MED ORDER — PEGFILGRASTIM 6 MG/0.6ML ~~LOC~~ PSKT: 6.0000 mg | PREFILLED_SYRINGE | Freq: Once | SUBCUTANEOUS | Status: DC

## 2018-12-10 MED ORDER — HEPARIN SOD (PORK) LOCK FLUSH 100 UNIT/ML IV SOLN
500.0000 [IU] | Freq: Once | INTRAVENOUS | Status: AC | PRN
  Administered 2018-12-10: 500 [IU]
  Filled 2018-12-10: qty 5

## 2018-12-10 MED ORDER — ATROPINE SULFATE 1 MG/ML IJ SOLN
0.5000 mg | Freq: Once | INTRAMUSCULAR | Status: AC | PRN
  Administered 2018-12-10: 0.5 mg via INTRAVENOUS

## 2018-12-10 MED ORDER — PALONOSETRON HCL INJECTION 0.25 MG/5ML
0.2500 mg | Freq: Once | INTRAVENOUS | Status: AC
  Administered 2018-12-10: 0.25 mg via INTRAVENOUS

## 2018-12-10 MED ORDER — PEGFILGRASTIM 6 MG/0.6ML ~~LOC~~ PSKT
PREFILLED_SYRINGE | SUBCUTANEOUS | Status: AC
  Filled 2018-12-10: qty 0.6

## 2018-12-10 MED ORDER — SODIUM CHLORIDE 0.9 % IV SOLN
Freq: Once | INTRAVENOUS | Status: AC
  Administered 2018-12-10: 09:00:00 via INTRAVENOUS
  Filled 2018-12-10: qty 250

## 2018-12-10 MED ORDER — SODIUM CHLORIDE 0.9 % IV SOLN
25.0000 mg/m2 | Freq: Once | INTRAVENOUS | Status: AC
  Administered 2018-12-10: 15:00:00 62 mg via INTRAVENOUS
  Filled 2018-12-10: qty 62

## 2018-12-10 MED ORDER — PALONOSETRON HCL INJECTION 0.25 MG/5ML
INTRAVENOUS | Status: AC
  Filled 2018-12-10: qty 5

## 2018-12-10 MED ORDER — POTASSIUM CHLORIDE 2 MEQ/ML IV SOLN
Freq: Once | INTRAVENOUS | Status: AC
  Administered 2018-12-10: 10:00:00 via INTRAVENOUS
  Filled 2018-12-10: qty 10

## 2018-12-10 NOTE — Patient Instructions (Addendum)
[facility] Discharge Instructions for Patients Receiving Chemotherapy  Today you received the following chemotherapy agents Irinotecan (CAMPTOSAR) & Cisplatin (PLATINOL).  To help prevent nausea and vomiting after your treatment, we encourage you to take your nausea medication as prescribed.   If you develop nausea and vomiting that is not controlled by your nausea medication, call the clinic.   BELOW ARE SYMPTOMS THAT SHOULD BE REPORTED IMMEDIATELY:  *FEVER GREATER THAN 100.5 F  *CHILLS WITH OR WITHOUT FEVER  NAUSEA AND VOMITING THAT IS NOT CONTROLLED WITH YOUR NAUSEA MEDICATION  *UNUSUAL SHORTNESS OF BREATH  *UNUSUAL BRUISING OR BLEEDING  TENDERNESS IN MOUTH AND THROAT WITH OR WITHOUT PRESENCE OF ULCERS  *URINARY PROBLEMS  *BOWEL PROBLEMS  UNUSUAL RASH Items with * indicate a potential emergency and should be followed up as soon as possible.  Feel free to call the clinic should you have any questions or concerns. The clinic phone number is [phone].  Please show the [facility] at check-in to the Emergency Department and triage nurse.  Pegfilgrastim injection What is this medicine? PEGFILGRASTIM (PEG fil gra stim) is a long-acting granulocyte colony-stimulating factor that stimulates the growth of neutrophils, a type of white blood cell important in the body's fight against infection. It is used to reduce the incidence of fever and infection in patients with certain types of cancer who are receiving chemotherapy that affects the bone marrow, and to increase survival after being exposed to high doses of radiation. This medicine may be used for other purposes; ask your health care provider or pharmacist if you have questions. COMMON BRAND NAME(S): Steve Rattler, Ziextenzo What should I tell my health care provider before I take this medicine? They need to know if you have any of these conditions:  kidney disease  latex  allergy  ongoing radiation therapy  sickle cell disease  skin reactions to acrylic adhesives (On-Body Injector only)  an unusual or allergic reaction to pegfilgrastim, filgrastim, other medicines, foods, dyes, or preservatives  pregnant or trying to get pregnant  breast-feeding How should I use this medicine? This medicine is for injection under the skin. If you get this medicine at home, you will be taught how to prepare and give the pre-filled syringe or how to use the On-body Injector. Refer to the patient Instructions for Use for detailed instructions. Use exactly as directed. Tell your healthcare provider immediately if you suspect that the On-body Injector may not have performed as intended or if you suspect the use of the On-body Injector resulted in a missed or partial dose. It is important that you put your used needles and syringes in a special sharps container. Do not put them in a trash can. If you do not have a sharps container, call your pharmacist or healthcare provider to get one. Talk to your pediatrician regarding the use of this medicine in children. While this drug may be prescribed for selected conditions, precautions do apply. Overdosage: If you think you have taken too much of this medicine contact a poison control center or emergency room at once. NOTE: This medicine is only for you. Do not share this medicine with others. What if I miss a dose? It is important not to miss your dose. Call your doctor or health care professional if you miss your dose. If you miss a dose due to an On-body Injector failure or leakage, a new dose should be administered as soon as possible using a single prefilled syringe for manual  use. What may interact with this medicine? Interactions have not been studied. Give your health care provider a list of all the medicines, herbs, non-prescription drugs, or dietary supplements you use. Also tell them if you smoke, drink alcohol, or use illegal  drugs. Some items may interact with your medicine. This list may not describe all possible interactions. Give your health care provider a list of all the medicines, herbs, non-prescription drugs, or dietary supplements you use. Also tell them if you smoke, drink alcohol, or use illegal drugs. Some items may interact with your medicine. What should I watch for while using this medicine? You may need blood work done while you are taking this medicine. If you are going to need a MRI, CT scan, or other procedure, tell your doctor that you are using this medicine (On-Body Injector only). What side effects may I notice from receiving this medicine? Side effects that you should report to your doctor or health care professional as soon as possible:  allergic reactions like skin rash, itching or hives, swelling of the face, lips, or tongue  back pain  dizziness  fever  pain, redness, or irritation at site where injected  pinpoint red spots on the skin  red or dark-brown urine  shortness of breath or breathing problems  stomach or side pain, or pain at the shoulder  swelling  tiredness  trouble passing urine or change in the amount of urine Side effects that usually do not require medical attention (report to your doctor or health care professional if they continue or are bothersome):  bone pain  muscle pain This list may not describe all possible side effects. Call your doctor for medical advice about side effects. You may report side effects to FDA at 1-800-FDA-1088. Where should I keep my medicine? Keep out of the reach of children. If you are using this medicine at home, you will be instructed on how to store it. Throw away any unused medicine after the expiration date on the label. NOTE: This sheet is a summary. It may not cover all possible information. If you have questions about this medicine, talk to your doctor, pharmacist, or health care provider.  2020 [employer]  Standard (2017-07-09 16:57:08)  Coronavirus (COVID-19) Are you at risk?  Are you at risk for the Coronavirus (COVID-19)?  To be considered HIGH RISK for Coronavirus (COVID-19), you have to meet the following criteria:  . Traveled to [place], [place], [place], [place] or [place]; or in the [place] to [place], [place], [place], or [place]; and have fever, cough, and shortness of breath within the last 2 weeks of travel OR . Been in close contact with a person diagnosed with COVID-19 within the last 2 weeks and have fever, cough, and shortness of breath . IF YOU DO NOT MEET THESE CRITERIA, YOU ARE CONSIDERED LOW RISK FOR COVID-19.  What to do if you are HIGH RISK for COVID-19?  Marland Kitchen If you are having a medical emergency, call 911. . Seek medical care right away. Before you go to a doctor's office, urgent care or emergency department, call ahead and tell them about your recent travel, contact with someone diagnosed with COVID-19, and your symptoms. You should receive instructions from your physician's office regarding next steps of care.  . When you arrive at healthcare provider, tell the healthcare staff immediately you have returned from visiting [place], [place], [place], [place] or [place]; or traveled in the [place] to [place], [place], [place],  or [place]; in the last two weeks or you have been in close contact with a person diagnosed with COVID-19 in the last 2 weeks.   . Tell the health care staff about your symptoms: fever, cough and shortness of breath. . After you have been seen by a medical provider, you will be either: o Tested for (COVID-19) and discharged home on quarantine except to seek medical care if symptoms worsen, and asked to  - Stay home and avoid contact with others until you get your results (4-5 days)  - Avoid travel on public transportation if possible (such as bus, train, or airplane) or o Sent to the Emergency Department by EMS for evaluation,  COVID-19 testing, and possible admission depending on your condition and test results.  What to do if you are LOW RISK for COVID-19?  Reduce your risk of any infection by using the same precautions used for avoiding the common cold or flu:  Marland Kitchen Wash your hands often with soap and warm water for at least 20 seconds.  If soap and water are not readily available, use an alcohol-based hand sanitizer with at least 60% alcohol.  . If coughing or sneezing, cover your mouth and nose by coughing or sneezing into the elbow areas of your shirt or coat, into a tissue or into your sleeve (not your hands). . Avoid shaking hands with others and consider head nods or verbal greetings only. . Avoid touching your eyes, nose, or mouth with unwashed hands.  . Avoid close contact with people who are sick. . Avoid places or events with large numbers of people in one location, like concerts or sporting events. . Carefully consider travel plans you have or are making. . If you are planning any travel outside or inside the [place], visit the CDC's Travelers' Health webpage for the latest health notices. . If you have some symptoms but not all symptoms, continue to monitor at home and seek medical attention if your symptoms worsen. . If you are having a medical emergency, call 911.   [place] / e-Visit: [email or web address]         [facility]: [facility]: [phone]                   [facility]: [phone]

## 2018-12-10 NOTE — Patient Instructions (Signed)

## 2018-12-11 ENCOUNTER — Ambulatory Visit: Payer: Medicaid Other

## 2018-12-12 ENCOUNTER — Other Ambulatory Visit: Payer: Self-pay

## 2018-12-12 ENCOUNTER — Inpatient Hospital Stay: Payer: Medicaid Other

## 2018-12-12 ENCOUNTER — Ambulatory Visit: Payer: Medicaid Other

## 2018-12-12 VITALS — BP 117/90 | HR 79 | Temp 98.7°F | Resp 20

## 2018-12-12 DIAGNOSIS — Z5111 Encounter for antineoplastic chemotherapy: Secondary | ICD-10-CM | POA: Diagnosis not present

## 2018-12-12 DIAGNOSIS — C3412 Malignant neoplasm of upper lobe, left bronchus or lung: Secondary | ICD-10-CM

## 2018-12-12 MED ORDER — PEGFILGRASTIM-CBQV 6 MG/0.6ML ~~LOC~~ SOSY
PREFILLED_SYRINGE | SUBCUTANEOUS | Status: AC
  Filled 2018-12-12: qty 0.6

## 2018-12-12 MED ORDER — PEGFILGRASTIM-CBQV 6 MG/0.6ML ~~LOC~~ SOSY
6.0000 mg | PREFILLED_SYRINGE | Freq: Once | SUBCUTANEOUS | Status: AC
  Administered 2018-12-12: 6 mg via SUBCUTANEOUS

## 2018-12-12 NOTE — Patient Instructions (Signed)

## 2018-12-16 ENCOUNTER — Other Ambulatory Visit: Payer: Medicaid Other

## 2018-12-16 ENCOUNTER — Ambulatory Visit: Payer: Medicaid Other

## 2018-12-17 ENCOUNTER — Encounter (HOSPITAL_COMMUNITY): Payer: Self-pay

## 2018-12-17 ENCOUNTER — Other Ambulatory Visit: Payer: Self-pay

## 2018-12-17 ENCOUNTER — Ambulatory Visit: Payer: Self-pay | Admitting: Internal Medicine

## 2018-12-17 ENCOUNTER — Ambulatory Visit (HOSPITAL_COMMUNITY)
Admission: RE | Admit: 2018-12-17 | Discharge: 2018-12-17 | Disposition: A | Discharge status: Home / Self Care | Payer: Medicaid Other | Source: Ambulatory Visit | Attending: Physician Assistant | Admitting: Physician Assistant

## 2018-12-17 ENCOUNTER — Ambulatory Visit: Payer: Medicaid Other

## 2018-12-17 ENCOUNTER — Other Ambulatory Visit: Payer: Medicaid Other

## 2018-12-17 DIAGNOSIS — C3412 Malignant neoplasm of upper lobe, left bronchus or lung: Secondary | ICD-10-CM | POA: Diagnosis not present

## 2018-12-17 MED ORDER — IOHEXOL 300 MG/ML  SOLN
100.0000 mL | Freq: Once | INTRAMUSCULAR | Status: AC | PRN
  Administered 2018-12-17: 14:00:00 100 mL via INTRAVENOUS

## 2018-12-17 MED ORDER — SODIUM CHLORIDE (PF) 0.9 % IJ SOLN
INTRAMUSCULAR | Status: AC
  Filled 2018-12-17: qty 50

## 2018-12-19 ENCOUNTER — Other Ambulatory Visit: Payer: Self-pay | Admitting: Medical Oncology

## 2018-12-19 ENCOUNTER — Other Ambulatory Visit: Payer: Self-pay | Admitting: Physician Assistant

## 2018-12-19 ENCOUNTER — Telehealth: Payer: Self-pay | Admitting: Medical Oncology

## 2018-12-19 DIAGNOSIS — C3412 Malignant neoplasm of upper lobe, left bronchus or lung: Secondary | ICD-10-CM

## 2018-12-19 MED ORDER — GABAPENTIN 100 MG PO CAPS: 100.0000 mg | ORAL_CAPSULE | Freq: Every day | ORAL | 2 refills | Status: DC

## 2018-12-19 NOTE — Telephone Encounter (Signed)
gabapentin refill requested.

## 2018-12-24 ENCOUNTER — Ambulatory Visit: Payer: Medicaid Other

## 2018-12-24 ENCOUNTER — Other Ambulatory Visit: Payer: Medicaid Other

## 2018-12-24 ENCOUNTER — Ambulatory Visit: Payer: Medicaid Other | Admitting: Internal Medicine

## 2018-12-24 ENCOUNTER — Other Ambulatory Visit: Payer: Self-pay

## 2018-12-24 ENCOUNTER — Ambulatory Visit: Payer: Self-pay

## 2018-12-25 ENCOUNTER — Inpatient Hospital Stay: Payer: Medicaid Other | Attending: Internal Medicine

## 2018-12-25 ENCOUNTER — Other Ambulatory Visit: Payer: Self-pay

## 2018-12-25 ENCOUNTER — Inpatient Hospital Stay (HOSPITAL_BASED_OUTPATIENT_CLINIC_OR_DEPARTMENT_OTHER): Payer: Medicaid Other | Admitting: Physician Assistant

## 2018-12-25 ENCOUNTER — Encounter: Payer: Self-pay | Admitting: Physician Assistant

## 2018-12-25 ENCOUNTER — Inpatient Hospital Stay: Payer: Medicaid Other

## 2018-12-25 VITALS — BP 138/99 | HR 114 | Temp 97.8°F | Resp 20 | Ht 67.0 in | Wt 278.6 lb

## 2018-12-25 VITALS — BP 120/88 | HR 86

## 2018-12-25 DIAGNOSIS — C3412 Malignant neoplasm of upper lobe, left bronchus or lung: Secondary | ICD-10-CM

## 2018-12-25 DIAGNOSIS — Z79899 Other long term (current) drug therapy: Secondary | ICD-10-CM | POA: Insufficient documentation

## 2018-12-25 DIAGNOSIS — C3490 Malignant neoplasm of unspecified part of unspecified bronchus or lung: Secondary | ICD-10-CM | POA: Diagnosis present

## 2018-12-25 DIAGNOSIS — I10 Essential (primary) hypertension: Secondary | ICD-10-CM | POA: Diagnosis not present

## 2018-12-25 DIAGNOSIS — Z5111 Encounter for antineoplastic chemotherapy: Secondary | ICD-10-CM

## 2018-12-25 DIAGNOSIS — D6481 Anemia due to antineoplastic chemotherapy: Secondary | ICD-10-CM | POA: Insufficient documentation

## 2018-12-25 DIAGNOSIS — E042 Nontoxic multinodular goiter: Secondary | ICD-10-CM | POA: Insufficient documentation

## 2018-12-25 DIAGNOSIS — Z5189 Encounter for other specified aftercare: Secondary | ICD-10-CM | POA: Insufficient documentation

## 2018-12-25 DIAGNOSIS — C7A1 Malignant poorly differentiated neuroendocrine tumors: Secondary | ICD-10-CM | POA: Insufficient documentation

## 2018-12-25 DIAGNOSIS — Z95828 Presence of other vascular implants and grafts: Secondary | ICD-10-CM

## 2018-12-25 DIAGNOSIS — Z7901 Long term (current) use of anticoagulants: Secondary | ICD-10-CM | POA: Diagnosis not present

## 2018-12-25 LAB — CBC WITH DIFFERENTIAL (CANCER CENTER ONLY)
Abs Immature Granulocytes: 0.02 10*3/uL (ref 0.00–0.07)
Basophils Absolute: 0 10*3/uL (ref 0.0–0.1)
Basophils Relative: 0 %
Eosinophils Absolute: 0 10*3/uL (ref 0.0–0.5)
Eosinophils Relative: 1 %
HCT: 26.5 % — ABNORMAL LOW (ref 36.0–46.0)
Hemoglobin: 8.5 g/dL — ABNORMAL LOW (ref 12.0–15.0)
Immature Granulocytes: 1 %
Lymphocytes Relative: 22 %
Lymphs Abs: 0.9 10*3/uL (ref 0.7–4.0)
MCH: 27.4 pg (ref 26.0–34.0)
MCHC: 32.1 g/dL (ref 30.0–36.0)
MCV: 85.5 fL (ref 80.0–100.0)
Monocytes Absolute: 0.6 10*3/uL (ref 0.1–1.0)
Monocytes Relative: 14 %
Neutro Abs: 2.5 10*3/uL (ref 1.7–7.7)
Neutrophils Relative %: 62 %
Platelet Count: 129 10*3/uL — ABNORMAL LOW (ref 150–400)
RBC: 3.1 MIL/uL — ABNORMAL LOW (ref 3.87–5.11)
RDW: 24.9 % — ABNORMAL HIGH (ref 11.5–15.5)
WBC Count: 4 10*3/uL (ref 4.0–10.5)
nRBC: 0 % (ref 0.0–0.2)

## 2018-12-25 LAB — CMP (CANCER CENTER ONLY)
ALT: 9 U/L (ref 0–44)
AST: 9 U/L — ABNORMAL LOW (ref 15–41)
Albumin: 3.5 g/dL (ref 3.5–5.0)
Alkaline Phosphatase: 98 U/L (ref 38–126)
Anion gap: 10 (ref 5–15)
BUN: 10 mg/dL (ref 6–20)
CO2: 25 mmol/L (ref 22–32)
Calcium: 8.7 mg/dL — ABNORMAL LOW (ref 8.9–10.3)
Chloride: 107 mmol/L (ref 98–111)
Creatinine: 1.12 mg/dL — ABNORMAL HIGH (ref 0.44–1.00)
GFR, Est AFR Am: >60 mL/min (ref >60)
GFR, Estimated: 54 mL/min — ABNORMAL LOW (ref >60)
Glucose, Bld: 110 mg/dL — ABNORMAL HIGH (ref 70–99)
Potassium: 3.5 mmol/L (ref 3.5–5.1)
Sodium: 142 mmol/L (ref 135–145)
Total Bilirubin: 0.8 mg/dL (ref 0.3–1.2)
Total Protein: 7.3 g/dL (ref 6.5–8.1)

## 2018-12-25 LAB — MAGNESIUM: Magnesium: 1.5 mg/dL — ABNORMAL LOW (ref 1.7–2.4)

## 2018-12-25 MED ORDER — SODIUM CHLORIDE 0.9 % IV SOLN
25.0000 mg/m2 | Freq: Once | INTRAVENOUS | Status: AC
  Administered 2018-12-25: 62 mg via INTRAVENOUS
  Filled 2018-12-25: qty 62

## 2018-12-25 MED ORDER — ATROPINE SULFATE 1 MG/ML IJ SOLN
0.5000 mg | Freq: Once | INTRAMUSCULAR | Status: AC | PRN
  Administered 2018-12-25: 13:00:00 0.5 mg via INTRAVENOUS

## 2018-12-25 MED ORDER — PALONOSETRON HCL INJECTION 0.25 MG/5ML
0.2500 mg | Freq: Once | INTRAVENOUS | Status: AC
  Administered 2018-12-25: 0.25 mg via INTRAVENOUS

## 2018-12-25 MED ORDER — HEPARIN SOD (PORK) LOCK FLUSH 100 UNIT/ML IV SOLN
500.0000 [IU] | Freq: Once | INTRAVENOUS | Status: AC | PRN
  Administered 2018-12-25: 17:00:00 500 [IU]
  Filled 2018-12-25: qty 5

## 2018-12-25 MED ORDER — MAGNESIUM SULFATE 2 GM/50ML IV SOLN
2.0000 g | Freq: Once | INTRAVENOUS | Status: AC
  Administered 2018-12-25: 10:00:00 2 g via INTRAVENOUS
  Filled 2018-12-25: qty 50

## 2018-12-25 MED ORDER — SODIUM CHLORIDE 0.9% FLUSH
10.0000 mL | INTRAVENOUS | Status: DC | PRN
  Administered 2018-12-25: 10 mL
  Filled 2018-12-25: qty 10

## 2018-12-25 MED ORDER — ATROPINE SULFATE 1 MG/ML IJ SOLN
INTRAMUSCULAR | Status: AC
  Filled 2018-12-25: qty 1

## 2018-12-25 MED ORDER — IRINOTECAN HCL CHEMO INJECTION 100 MG/5ML
50.0000 mg/m2 | Freq: Once | INTRAVENOUS | Status: AC
  Administered 2018-12-25: 120 mg via INTRAVENOUS
  Filled 2018-12-25: qty 6

## 2018-12-25 MED ORDER — POTASSIUM CHLORIDE 2 MEQ/ML IV SOLN
Freq: Once | INTRAVENOUS | Status: AC
  Administered 2018-12-25: 10:00:00 via INTRAVENOUS
  Filled 2018-12-25: qty 10

## 2018-12-25 MED ORDER — SODIUM CHLORIDE 0.9 % IV SOLN
Freq: Once | INTRAVENOUS | Status: AC
  Administered 2018-12-25: 12:00:00 via INTRAVENOUS
  Filled 2018-12-25: qty 5

## 2018-12-25 MED ORDER — SODIUM CHLORIDE 0.9 % IV SOLN
Freq: Once | INTRAVENOUS | Status: AC
  Administered 2018-12-25: 09:00:00 via INTRAVENOUS
  Filled 2018-12-25: qty 250

## 2018-12-25 MED ORDER — PALONOSETRON HCL INJECTION 0.25 MG/5ML
INTRAVENOUS | Status: AC
  Filled 2018-12-25: qty 5

## 2018-12-25 NOTE — Progress Notes (Signed)
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  PRIOR THERAPY: 1)Carboplatin for AUC of 5 on day 1, etoposide 100 mg/M2 on days 1, 2 and 3 with Neulasta support in addition to [place].First dose February 10th, 2020. Status post4cycles. 2)Maintenance treatment with Tecentriq 1200 mg IV every 3 weeks. First dose Sep 10, 2018. Status post 3cycles.This was discontinued secondary to disease progression.  CURRENT THERAPY: Systemic chemotherapy with cisplatin 30 mg/M2 and irinotecan 65 mg/M2 on days 1 and 8 every 3 weeks. First dose on October 28, 2018. Status post 2 cycles of treatment.Starting from cycle #2, her dose will be reduced to Cisplatin 25 mg/m2 and Irinotecan 50 mg/m2 on days 1 and 8 every 3 weeks with neulasta support.   INTERVAL HISTORY: Samantha Santos 58 y.o. female returns to the clinic for a follow-up visit.  The patient is feeling fairly well today without any concerning complaints except for some thoracic pain and sternal pain at night. The patient uses a heating pad and takes tylenol for her symptoms. The patient states that the pain is unchanged from prior.  She tolerated her last cycle of treatment fairly well without any concerning adverse side effects.  She denies any fever, chills, night sweats, or weight loss.  She denies any chest pain, shortness of breath, cough, or hemoptysis.  She denies any nausea, vomiting, diarrhea, or constipation.  She denies any headache or visual changes.  She recently had a restaging CT scan performed.  She is here today for evaluation and to review her scan results before starting cycle #3.   MEDICAL HISTORY: Past Medical History:  Diagnosis Date  . Anemia   . Arthritis   . GERD (gastroesophageal reflux disease)   . Hypertension   .  Morbid obesity ([place]) 05/06/2018  . Supraclavicular adenopathy     ALLERGIES:  is allergic to penicillins and oxycodone.  MEDICATIONS:  Current Outpatient Medications  Medication Sig Dispense Refill  . diphenoxylate-atropine (LOMOTIL) 2.5-0.025 MG tablet 1 to 2 tablets 4 times daily as needed for diarrhea 40 tablet 1  . gabapentin (NEURONTIN) 100 MG capsule Take 1 capsule (100 mg total) by mouth at bedtime. 30 capsule 2  . lidocaine-prilocaine (EMLA) cream Apply 1 application topically as needed (chemo port). (Patient not taking: Reported on 12/03/2018) 30 g 0  . loratadine (CLARITIN) 10 MG tablet Take 10 mg by mouth daily.    Marland Kitchen LORazepam (ATIVAN) 0.5 MG tablet Place 1 tablet (0.5 mg total) under the tongue every 6 (six) hours as needed (Nausea). 30 tablet 1  . losartan-hydrochlorothiazide (HYZAAR) 100-12.5 MG tablet Take 1 tablet by mouth daily.    . prochlorperazine (COMPAZINE) 10 MG tablet Take 1 tablet (10 mg total) by mouth every 6 (six) hours as needed for nausea or vomiting. 50 tablet 3  . rivaroxaban (XARELTO) 20 MG TABS tablet Take 1 tablet (20 mg total) by mouth daily with supper. 30 tablet 2  . traMADol (ULTRAM) 50 MG tablet Take 1 tablet (50 mg total) by mouth every 6 (six) hours as needed. 20 tablet 0   No current facility-administered medications for this visit.    Facility-Administered Medications Ordered in Other Visits  Medication Dose Route Frequency Provider Last Rate Last Dose  . 0.9 %  sodium chloride infusion   Intravenous Once Curt Bears, MD      .  dextrose 5 % and 0.45% NaCl 1,000 mL with potassium chloride 20 mEq, magnesium sulfate 12 mEq infusion   Intravenous Once Curt Bears, MD      . heparin lock flush 100 unit/mL  500 Units Intracatheter Once PRN Curt Bears, MD      . sodium chloride flush (NS) 0.9 % injection 10 mL  10 mL Intracatheter PRN Curt Bears, MD        SURGICAL HISTORY:  Past Surgical History:  Procedure Laterality Date  .  CESAREAN SECTION  1988  . HYSTEROSCOPY W/D&C  06/14/2011   Procedure: DILATATION AND CURETTAGE /HYSTEROSCOPY;  Surgeon: Frederico Hamman, MD;  Location: [facility];  Service: Gynecology;  Laterality: N/A;  . IR CV LINE INJECTION  06/25/2018  . PORTACATH PLACEMENT Left 06/13/2018   Procedure: INSERTION PORT-A-CATH;  Surgeon: [name], MD;  Location: [facility];  Service: Thoracic;  Laterality: Left;  . SUPRACLAVICAL NODE BIOPSY Right 05/10/2018   Procedure: SUPRACLAVICAL NODE BIOPSY;  Surgeon: [name], MD;  Location: [facility];  Service: Thoracic;  Laterality: Right;  . TUBAL LIGATION      REVIEW OF SYSTEMS:   Review of Systems  Constitutional: Positive for fatigue. Negative for appetite change, chills, fever and unexpected weight change.  HENT: Negative for mouth sores, nosebleeds, sore throat and trouble swallowing.   Eyes: Negative for eye problems and icterus.  Respiratory: Negative for cough, hemoptysis, shortness of breath and wheezing.   Cardiovascular: Positive for occasional sternal chest pain at night. Negative for  leg swelling.  Gastrointestinal: Negative for abdominal pain, constipation, diarrhea, nausea and vomiting.  Genitourinary: Negative for bladder incontinence, difficulty urinating, dysuria, frequency and hematuria.   Musculoskeletal: Positive for thoracic back pain. Negative for gait problem, neck pain and neck stiffness.  Skin: Negative for itching and rash.  Neurological: Negative for dizziness, extremity weakness, gait problem, headaches, light-headedness and seizures.  Hematological: Negative for adenopathy. Does not bruise/bleed easily.  Psychiatric/Behavioral: Negative for confusion, depression and sleep disturbance. The patient is not nervous/anxious.     PHYSICAL EXAMINATION:  Blood pressure (!) 138/99, pulse (!) 114, temperature 97.8 F (36.6 C), resp. rate 20, height 5\' 7"  (1.702 m), weight 278 lb 9.6 oz (126.4 kg), SpO2 100 %.  ECOG  PERFORMANCE STATUS: 1 - Symptomatic but completely ambulatory  Physical Exam Constitutional: Oriented to person, place, and time and well-developed, well-nourished, and in no distress. HENT:  Head: Normocephalic and atraumatic.  Mouth/Throat: Oropharynx is clear and moist. No oropharyngeal exudate.  Eyes: Conjunctivae are normal. Right eye exhibits no discharge. Left eye exhibits no discharge. No scleral icterus.  Neck: Normal range of motion. Neck supple.  Cardiovascular: Normal rate, regular rhythm, normal heart sounds and intact distal pulses.  Pulmonary/Chest: Effort normal. Decreased breath sounds in left lower lobe.No respiratory distress. No wheezes. No rales.  Abdominal: Soft. Bowel sounds are normal. Exhibits no distension and no mass. There is no tenderness.  Musculoskeletal: Normal range of motion. Exhibits no edema.  Lymphadenopathy:  Positive for right supraclavicular lymphadenopathy Neurological: Alert and oriented to person, place, and time. Exhibits normal muscle tone. Gait normal. Coordination normal.  Skin: Skin is warm and dry. No rash noted. Not diaphoretic. No erythema. No pallor.  Psychiatric: Mood, memory and judgment normal.  Vitals reviewed.  LABORATORY DATA: Lab Results  Component Value Date   WBC 4.0 12/25/2018   HGB 8.5 (L) 12/25/2018   HCT 26.5 (L) 12/25/2018   MCV 85.5 12/25/2018   PLT 129 (L) 12/25/2018  Chemistry      Component Value Date/Time   NA 143 12/10/2018 0830   NA 144 04/08/2018 1145   K 3.7 12/10/2018 0830   CL 107 12/10/2018 0830   CO2 25 12/10/2018 0830   BUN 16 12/10/2018 0830   BUN 10 04/08/2018 1145   CREATININE 1.18 (H) 12/10/2018 0830      Component Value Date/Time   CALCIUM 8.4 (L) 12/10/2018 0830   ALKPHOS 89 12/10/2018 0830   AST 8 (L) 12/10/2018 0830   ALT 11 12/10/2018 0830   BILITOT 0.5 12/10/2018 0830       RADIOGRAPHIC STUDIES:  Ct Chest W Contrast  Result Date: 12/18/2018 CLINICAL DATA:  Ongoing  chemotherapy and immunotherapy for lung cancer diagnosed this year. EXAM: CT CHEST, ABDOMEN, AND PELVIS WITH CONTRAST TECHNIQUE: Multidetector CT imaging of the chest, abdomen and pelvis was performed following the standard protocol during bolus administration of intravenous contrast. CONTRAST:  154mL OMNIPAQUE IOHEXOL 300 MG/ML  SOLN COMPARISON:  10/16/2018 FINDINGS: CT CHEST FINDINGS Cardiovascular: Left subclavian porta catheter tip in the superior aspect of the inferior vena cava. Decrease in size of a small pericardial effusion, currently measuring 13 mm in maximum thickness. Normal sized heart. Minimal atheromatous aortic calcification. Mediastinum/Nodes: Subcentimeter thyroid nodules. Again demonstrated is an anterior mediastinal mass. This currently measures 5.7 x 3.4 cm on image number 17 series 2, previously 6.3 x 3.3 cm. Left anterior prevascular enlarged lymph nodes measuring a combined 3.5 x 1.5 cm on image number 20 series 2, previously 2.8 x 2.1 cm. Enlarged left axillary lymph node with a short axis diameter of 14 mm on image number 18 series 2, previously 17 mm. Left supraclavicular node with a short axis diameter of 13 mm on image number 6 series 2, previously 12 mm. Index right supraclavicular node with a short axis diameter 15 mm on image number 2 series 2, not included in its entirety. This previously had a short axis diameter of 22 mm. Multiple enlarged right lower epicardial and CP angle nodes. The largest has a short axis diameter of 22 mm on image number 36 series 18, previously 19 mm. No new enlarged nodes are seen. Lungs/Pleura: Consolidative left lower lobe atelectasis with air bronchograms has not changed significantly. No pleural fluid. Musculoskeletal: Thoracic spine degenerative changes and dextroconvex scoliosis. Less prominent sclerosis of the manubrium. CT ABDOMEN PELVIS FINDINGS Hepatobiliary: No focal liver abnormality is seen. No gallstones, gallbladder wall thickening, or  biliary dilatation. Pancreas: Unremarkable. No pancreatic ductal dilatation or surrounding inflammatory changes. Spleen: No significant change in 2 small rounded areas of low density. Adrenals/Urinary Tract: Stable bilateral renal cysts. Unremarkable adrenal glands, ureters and urinary bladder. Stomach/Bowel: Stomach is within normal limits. Appendix appears normal. No evidence of bowel wall thickening, distention, or inflammatory changes. Vascular/Lymphatic: No significant vascular findings are present. No enlarged abdominal or pelvic lymph nodes. Reproductive: Uterus and bilateral adnexa are unremarkable. Other: No abdominal wall hernia or abnormality. No abdominopelvic ascites. Musculoskeletal: Minimal lumbar spine degenerative changes and mild scoliosis. IMPRESSION: 1. Mild progression of some of the metastatic adenopathy involving the chest and mild improvement of other adenopathy. The overall amount of adenopathy is similar to the previous examination. 2. Stable left lower lobe atelectasis. 3. The left subclavian porta catheter tip is in the superior aspect of the inferior vena cava. 4. No significant change in 2 small rounded areas of low density in the spleen. These could represent small cysts or hemangiomas. Metastases are unlikely. 5. Less prominent sclerosis of  the manubrium, compatible with treated metastatic disease. 6. Mild decrease in size of a small pericardial effusion. 7. Sub-centimeter thyroid nodule(s) noted, too small to characterize, but most likely benign in the absence of known clinical risk factors for thyroid carcinoma. Aortic Atherosclerosis (ICD10-I70.0). Electronically Signed   By: Claudie Revering M.D.   On: 12/18/2018 08:48   Ct Abdomen Pelvis W Contrast  Result Date: 12/18/2018 CLINICAL DATA:  Ongoing chemotherapy and immunotherapy for lung cancer diagnosed this year. EXAM: CT CHEST, ABDOMEN, AND PELVIS WITH CONTRAST TECHNIQUE: Multidetector CT imaging of the chest, abdomen and pelvis  was performed following the standard protocol during bolus administration of intravenous contrast. CONTRAST:  120mL OMNIPAQUE IOHEXOL 300 MG/ML  SOLN COMPARISON:  10/16/2018 FINDINGS: CT CHEST FINDINGS Cardiovascular: Left subclavian porta catheter tip in the superior aspect of the inferior vena cava. Decrease in size of a small pericardial effusion, currently measuring 13 mm in maximum thickness. Normal sized heart. Minimal atheromatous aortic calcification. Mediastinum/Nodes: Subcentimeter thyroid nodules. Again demonstrated is an anterior mediastinal mass. This currently measures 5.7 x 3.4 cm on image number 17 series 2, previously 6.3 x 3.3 cm. Left anterior prevascular enlarged lymph nodes measuring a combined 3.5 x 1.5 cm on image number 20 series 2, previously 2.8 x 2.1 cm. Enlarged left axillary lymph node with a short axis diameter of 14 mm on image number 18 series 2, previously 17 mm. Left supraclavicular node with a short axis diameter of 13 mm on image number 6 series 2, previously 12 mm. Index right supraclavicular node with a short axis diameter 15 mm on image number 2 series 2, not included in its entirety. This previously had a short axis diameter of 22 mm. Multiple enlarged right lower epicardial and CP angle nodes. The largest has a short axis diameter of 22 mm on image number 36 series 18, previously 19 mm. No new enlarged nodes are seen. Lungs/Pleura: Consolidative left lower lobe atelectasis with air bronchograms has not changed significantly. No pleural fluid. Musculoskeletal: Thoracic spine degenerative changes and dextroconvex scoliosis. Less prominent sclerosis of the manubrium. CT ABDOMEN PELVIS FINDINGS Hepatobiliary: No focal liver abnormality is seen. No gallstones, gallbladder wall thickening, or biliary dilatation. Pancreas: Unremarkable. No pancreatic ductal dilatation or surrounding inflammatory changes. Spleen: No significant change in 2 small rounded areas of low density.  Adrenals/Urinary Tract: Stable bilateral renal cysts. Unremarkable adrenal glands, ureters and urinary bladder. Stomach/Bowel: Stomach is within normal limits. Appendix appears normal. No evidence of bowel wall thickening, distention, or inflammatory changes. Vascular/Lymphatic: No significant vascular findings are present. No enlarged abdominal or pelvic lymph nodes. Reproductive: Uterus and bilateral adnexa are unremarkable. Other: No abdominal wall hernia or abnormality. No abdominopelvic ascites. Musculoskeletal: Minimal lumbar spine degenerative changes and mild scoliosis. IMPRESSION: 1. Mild progression of some of the metastatic adenopathy involving the chest and mild improvement of other adenopathy. The overall amount of adenopathy is similar to the previous examination. 2. Stable left lower lobe atelectasis. 3. The left subclavian porta catheter tip is in the superior aspect of the inferior vena cava. 4. No significant change in 2 small rounded areas of low density in the spleen. These could represent small cysts or hemangiomas. Metastases are unlikely. 5. Less prominent sclerosis of the manubrium, compatible with treated metastatic disease. 6. Mild decrease in size of a small pericardial effusion. 7. Sub-centimeter thyroid nodule(s) noted, too small to characterize, but most likely benign in the absence of known clinical risk factors for thyroid carcinoma. Aortic Atherosclerosis (ICD10-I70.0). Electronically Signed  By: Claudie Revering M.D.   On: 12/18/2018 08:48     ASSESSMENT/PLAN:  This isa very pleasant 58 year old African-American female with stage IV (T2a, N3, M1C)high-grade neuroendocrine carcinoma with focal areas of small cell lung cancer. She was diagnosed in January 2020.  Shepreviously underwenttreatment with carboplatin for an AUC of 5 on day 1, etoposide 100 mg/m on days 1, 2, and 3 with Tecentriq and Neulasta support. She is status post7cycles. Starting from cycle #5, the  patient had been on maintenance tecentriq. This was discontinued due to evidence of disease progression.   She is currently undergoing systemic chemotherapy withcisplatin 30 mg/m and irinotecan 65 mg/m on days 1 and 8 every 3 weeks. She is status post 2 cycles. She experienced N/V/D following cycle 1 which was controlled with fluids, ativan, and lomotil. She also had pancytopenia and electrolyte disturbances requiring granix and IV KCl and Mg. Growth factor support has been added following day 8 of every cycle to prevent neutropenia and dose delays.   The patient recently had a restaging CT scan performed.  Dr. Julien Nordmann personally and independently reviewed the scan and discussed results with the patient today.  The scan showed no evidence of disease progression. Dr. Julien Nordmann recommends that the patient continue on the same treatment.  She will proceed with cycle #3 today as scheduled.   We will see her back for a follow up visit in 3 weeks for evaluation before starting cycle #4.   The patient has imodium, lomotil, and compazine should she develop nausea and vomiting from treatment today.   The patient will continue to take tylenol and use her heating pad for her back pain.   We will add an additional 2 g of magnesium sulfate to her IVF today for her hypomagnesemia  The patient was advised to call immediately if she has any concerning symptoms in the interval. The patient voices understanding of current disease status and treatment options and is in agreement with the current care plan. All questions were answered. The patient knows to call the clinic with any problems, questions or concerns. We can certainly see the patient much sooner if necessary  No orders of the defined types were placed in this encounter.    Cassandra L Heilingoetter, PA-C 12/25/18  ADDENDUM: Hematology/Oncology Attending:  I had a face-to-face encounter with the patient today.  I recommended his care plan.   This is a very pleasant 58 years old African-American female with metastatic high-grade neuroendocrine carcinoma with focal areas of small cell carcinoma.  The patient status post first-line treatment with carboplatin, etoposide and Tecentriq discontinued secondary to disease progression. She is currently undergoing second line treatment with reduced dose cisplatin and irinotecan status post 2 cycles.  She has been tolerating this treatment well except for the electrolyte imbalance especially hypomagnesemia. She had repeat CT scan of the chest, abdomen pelvis performed recently. I personally and independently reviewed the scan with the patient today. Her scan showed no concerning findings for disease progression. I recommended for her to continue her current treatment with reduced dose cisplatin and irinotecan and she will proceed with cycle #3 today. For the hypomagnesemia we will arrange for the patient to receive additional magnesium sulfate intravenously today. The patient will come back for follow-up visit in 3 weeks for evaluation before the next cycle of her treatment. She was advised to call immediately if she has any concerning symptoms in the interval.  Disclaimer: This note was dictated with voice recognition software. Similar sounding  words can inadvertently be transcribed and may be missed upon review. Eilleen Kempf, MD 12/25/18

## 2018-12-25 NOTE — Patient Instructions (Signed)
[facility] Discharge Instructions for Patients Receiving Chemotherapy  Today you received the following chemotherapy agents :  Gemcitabine,  Cisplatin.  To help prevent nausea and vomiting after your treatment, we encourage you to take your nausea medication as prescribed.   If you develop nausea and vomiting that is not controlled by your nausea medication, call the clinic.   BELOW ARE SYMPTOMS THAT SHOULD BE REPORTED IMMEDIATELY:  *FEVER GREATER THAN 100.5 F  *CHILLS WITH OR WITHOUT FEVER  NAUSEA AND VOMITING THAT IS NOT CONTROLLED WITH YOUR NAUSEA MEDICATION  *UNUSUAL SHORTNESS OF BREATH  *UNUSUAL BRUISING OR BLEEDING  TENDERNESS IN MOUTH AND THROAT WITH OR WITHOUT PRESENCE OF ULCERS  *URINARY PROBLEMS  *BOWEL PROBLEMS  UNUSUAL RASH Items with * indicate a potential emergency and should be followed up as soon as possible.  Feel free to call the clinic should you have any questions or concerns. The clinic phone number is [phone].  Please show the [facility] at check-in to the Emergency Department and triage nurse.

## 2018-12-30 ENCOUNTER — Other Ambulatory Visit: Payer: Medicaid Other

## 2018-12-30 ENCOUNTER — Ambulatory Visit: Payer: Medicaid Other

## 2018-12-30 ENCOUNTER — Ambulatory Visit: Payer: Medicaid Other | Admitting: Internal Medicine

## 2018-12-31 ENCOUNTER — Inpatient Hospital Stay: Payer: Medicaid Other

## 2018-12-31 ENCOUNTER — Other Ambulatory Visit: Payer: Self-pay

## 2018-12-31 ENCOUNTER — Other Ambulatory Visit: Payer: Medicaid Other

## 2018-12-31 ENCOUNTER — Ambulatory Visit: Payer: Medicaid Other

## 2018-12-31 ENCOUNTER — Ambulatory Visit: Payer: Medicaid Other | Admitting: Internal Medicine

## 2018-12-31 VITALS — BP 135/93 | HR 72 | Temp 98.2°F | Resp 17 | Wt 279.8 lb

## 2018-12-31 DIAGNOSIS — C3412 Malignant neoplasm of upper lobe, left bronchus or lung: Secondary | ICD-10-CM

## 2018-12-31 DIAGNOSIS — Z5111 Encounter for antineoplastic chemotherapy: Secondary | ICD-10-CM | POA: Diagnosis not present

## 2018-12-31 DIAGNOSIS — Z95828 Presence of other vascular implants and grafts: Secondary | ICD-10-CM

## 2018-12-31 LAB — CMP (CANCER CENTER ONLY)
ALT: 10 U/L (ref 0–44)
AST: 7 U/L — ABNORMAL LOW (ref 15–41)
Albumin: 3.3 g/dL — ABNORMAL LOW (ref 3.5–5.0)
Alkaline Phosphatase: 91 U/L (ref 38–126)
Anion gap: 7 (ref 5–15)
BUN: 13 mg/dL (ref 6–20)
CO2: 28 mmol/L (ref 22–32)
Calcium: 8.5 mg/dL — ABNORMAL LOW (ref 8.9–10.3)
Chloride: 107 mmol/L (ref 98–111)
Creatinine: 1.22 mg/dL — ABNORMAL HIGH (ref 0.44–1.00)
GFR, Est AFR Am: 57 mL/min — ABNORMAL LOW (ref >60)
GFR, Estimated: 49 mL/min — ABNORMAL LOW (ref >60)
Glucose, Bld: 125 mg/dL — ABNORMAL HIGH (ref 70–99)
Potassium: 3.8 mmol/L (ref 3.5–5.1)
Sodium: 142 mmol/L (ref 135–145)
Total Bilirubin: 0.4 mg/dL (ref 0.3–1.2)
Total Protein: 7 g/dL (ref 6.5–8.1)

## 2018-12-31 LAB — CBC WITH DIFFERENTIAL (CANCER CENTER ONLY)
Abs Immature Granulocytes: 0.02 10*3/uL (ref 0.00–0.07)
Basophils Absolute: 0 10*3/uL (ref 0.0–0.1)
Basophils Relative: 0 %
Eosinophils Absolute: 0 10*3/uL (ref 0.0–0.5)
Eosinophils Relative: 1 %
HCT: 25.3 % — ABNORMAL LOW (ref 36.0–46.0)
Hemoglobin: 8.1 g/dL — ABNORMAL LOW (ref 12.0–15.0)
Immature Granulocytes: 1 %
Lymphocytes Relative: 34 %
Lymphs Abs: 1.2 10*3/uL (ref 0.7–4.0)
MCH: 27.3 pg (ref 26.0–34.0)
MCHC: 32 g/dL (ref 30.0–36.0)
MCV: 85.2 fL (ref 80.0–100.0)
Monocytes Absolute: 0.3 10*3/uL (ref 0.1–1.0)
Monocytes Relative: 7 %
Neutro Abs: 2.1 10*3/uL (ref 1.7–7.7)
Neutrophils Relative %: 57 %
Platelet Count: 124 10*3/uL — ABNORMAL LOW (ref 150–400)
RBC: 2.97 MIL/uL — ABNORMAL LOW (ref 3.87–5.11)
RDW: 22.8 % — ABNORMAL HIGH (ref 11.5–15.5)
WBC Count: 3.6 10*3/uL — ABNORMAL LOW (ref 4.0–10.5)
nRBC: 0 % (ref 0.0–0.2)

## 2018-12-31 LAB — MAGNESIUM: Magnesium: 1.5 mg/dL — ABNORMAL LOW (ref 1.7–2.4)

## 2018-12-31 LAB — SAMPLE TO BLOOD BANK

## 2018-12-31 MED ORDER — SODIUM CHLORIDE 0.9% FLUSH
10.0000 mL | INTRAVENOUS | Status: DC | PRN
  Administered 2018-12-31: 10 mL
  Filled 2018-12-31: qty 10

## 2018-12-31 MED ORDER — SODIUM CHLORIDE 0.9 % IV SOLN
Freq: Once | INTRAVENOUS | Status: AC
  Administered 2018-12-31: 10:00:00 via INTRAVENOUS
  Filled 2018-12-31: qty 250

## 2018-12-31 MED ORDER — IRINOTECAN HCL CHEMO INJECTION 100 MG/5ML
50.0000 mg/m2 | Freq: Once | INTRAVENOUS | Status: AC
  Administered 2018-12-31: 120 mg via INTRAVENOUS
  Filled 2018-12-31: qty 6

## 2018-12-31 MED ORDER — SODIUM CHLORIDE 0.9 % IV SOLN
25.0000 mg/m2 | Freq: Once | INTRAVENOUS | Status: AC
  Administered 2018-12-31: 62 mg via INTRAVENOUS
  Filled 2018-12-31: qty 62

## 2018-12-31 MED ORDER — ATROPINE SULFATE 1 MG/ML IJ SOLN: 0.5000 mg | Freq: Once | INTRAMUSCULAR | Status: DC | PRN

## 2018-12-31 MED ORDER — HEPARIN SOD (PORK) LOCK FLUSH 100 UNIT/ML IV SOLN
500.0000 [IU] | Freq: Once | INTRAVENOUS | Status: AC | PRN
  Administered 2018-12-31: 500 [IU]
  Filled 2018-12-31: qty 5

## 2018-12-31 MED ORDER — ATROPINE SULFATE 1 MG/ML IJ SOLN
INTRAMUSCULAR | Status: AC
  Filled 2018-12-31: qty 1

## 2018-12-31 MED ORDER — SODIUM CHLORIDE 0.9 % IV SOLN
Freq: Once | INTRAVENOUS | Status: AC
  Administered 2018-12-31: 12:00:00 via INTRAVENOUS
  Filled 2018-12-31: qty 5

## 2018-12-31 MED ORDER — PALONOSETRON HCL INJECTION 0.25 MG/5ML
INTRAVENOUS | Status: AC
  Filled 2018-12-31: qty 5

## 2018-12-31 MED ORDER — PALONOSETRON HCL INJECTION 0.25 MG/5ML
0.2500 mg | Freq: Once | INTRAVENOUS | Status: AC
  Administered 2018-12-31: 12:00:00 0.25 mg via INTRAVENOUS

## 2018-12-31 MED ORDER — POTASSIUM CHLORIDE 2 MEQ/ML IV SOLN
Freq: Once | INTRAVENOUS | Status: AC
  Administered 2018-12-31: 10:00:00 via INTRAVENOUS
  Filled 2018-12-31: qty 10

## 2018-12-31 NOTE — Progress Notes (Signed)
Mag 1.5 per Dr. Earlie Server add 2g Mag sulfate to hydration fluids. Verbal order communicated to [facility].

## 2018-12-31 NOTE — Patient Instructions (Signed)

## 2018-12-31 NOTE — Patient Instructions (Signed)
[facility] Discharge Instructions for Patients Receiving Chemotherapy  Today you received the following chemotherapy agents: Irinotecan, Cisplatin  To help prevent nausea and vomiting after your treatment, we encourage you to take your nausea medication as directed.   If you develop nausea and vomiting that is not controlled by your nausea medication, call the clinic.   BELOW ARE SYMPTOMS THAT SHOULD BE REPORTED IMMEDIATELY:  *FEVER GREATER THAN 100.5 F  *CHILLS WITH OR WITHOUT FEVER  NAUSEA AND VOMITING THAT IS NOT CONTROLLED WITH YOUR NAUSEA MEDICATION  *UNUSUAL SHORTNESS OF BREATH  *UNUSUAL BRUISING OR BLEEDING  TENDERNESS IN MOUTH AND THROAT WITH OR WITHOUT PRESENCE OF ULCERS  *URINARY PROBLEMS  *BOWEL PROBLEMS  UNUSUAL RASH Items with * indicate a potential emergency and should be followed up as soon as possible.  Feel free to call the clinic should you have any questions or concerns. The clinic phone number is [phone].  Please show the [facility] at check-in to the Emergency Department and triage nurse.

## 2019-01-01 ENCOUNTER — Inpatient Hospital Stay: Payer: Medicaid Other

## 2019-01-01 ENCOUNTER — Other Ambulatory Visit: Payer: Self-pay

## 2019-01-01 DIAGNOSIS — Z5111 Encounter for antineoplastic chemotherapy: Secondary | ICD-10-CM | POA: Diagnosis not present

## 2019-01-01 DIAGNOSIS — C3412 Malignant neoplasm of upper lobe, left bronchus or lung: Secondary | ICD-10-CM

## 2019-01-01 MED ORDER — PEGFILGRASTIM-CBQV 6 MG/0.6ML ~~LOC~~ SOSY
6.0000 mg | PREFILLED_SYRINGE | Freq: Once | SUBCUTANEOUS | Status: AC
  Administered 2019-01-01: 16:00:00 6 mg via SUBCUTANEOUS

## 2019-01-01 MED ORDER — PEGFILGRASTIM-CBQV 6 MG/0.6ML ~~LOC~~ SOSY
PREFILLED_SYRINGE | SUBCUTANEOUS | Status: AC
  Filled 2019-01-01: qty 0.6

## 2019-01-06 ENCOUNTER — Other Ambulatory Visit: Payer: Medicaid Other

## 2019-01-06 ENCOUNTER — Ambulatory Visit: Payer: Medicaid Other

## 2019-01-07 ENCOUNTER — Other Ambulatory Visit: Payer: Medicaid Other

## 2019-01-07 ENCOUNTER — Ambulatory Visit: Payer: Self-pay | Admitting: Internal Medicine

## 2019-01-07 ENCOUNTER — Ambulatory Visit: Payer: Medicaid Other

## 2019-01-14 ENCOUNTER — Inpatient Hospital Stay: Payer: Medicaid Other

## 2019-01-14 ENCOUNTER — Ambulatory Visit: Payer: Self-pay

## 2019-01-14 ENCOUNTER — Other Ambulatory Visit: Payer: Self-pay | Admitting: Medical Oncology

## 2019-01-14 ENCOUNTER — Other Ambulatory Visit: Payer: Self-pay

## 2019-01-14 ENCOUNTER — Inpatient Hospital Stay (HOSPITAL_BASED_OUTPATIENT_CLINIC_OR_DEPARTMENT_OTHER): Payer: Medicaid Other | Admitting: Internal Medicine

## 2019-01-14 ENCOUNTER — Telehealth: Payer: Self-pay | Admitting: *Deleted

## 2019-01-14 ENCOUNTER — Other Ambulatory Visit: Payer: Medicaid Other

## 2019-01-14 ENCOUNTER — Encounter: Payer: Self-pay | Admitting: Internal Medicine

## 2019-01-14 ENCOUNTER — Telehealth: Payer: Self-pay | Admitting: Internal Medicine

## 2019-01-14 VITALS — BP 132/98 | HR 111 | Temp 98.7°F | Resp 18 | Ht 67.0 in | Wt 282.3 lb

## 2019-01-14 DIAGNOSIS — T451X5A Adverse effect of antineoplastic and immunosuppressive drugs, initial encounter: Secondary | ICD-10-CM

## 2019-01-14 DIAGNOSIS — D6481 Anemia due to antineoplastic chemotherapy: Secondary | ICD-10-CM

## 2019-01-14 DIAGNOSIS — C3412 Malignant neoplasm of upper lobe, left bronchus or lung: Secondary | ICD-10-CM

## 2019-01-14 DIAGNOSIS — C3492 Malignant neoplasm of unspecified part of left bronchus or lung: Secondary | ICD-10-CM | POA: Diagnosis not present

## 2019-01-14 DIAGNOSIS — Z95828 Presence of other vascular implants and grafts: Secondary | ICD-10-CM

## 2019-01-14 DIAGNOSIS — I10 Essential (primary) hypertension: Secondary | ICD-10-CM | POA: Diagnosis not present

## 2019-01-14 DIAGNOSIS — Z5111 Encounter for antineoplastic chemotherapy: Secondary | ICD-10-CM

## 2019-01-14 DIAGNOSIS — D649 Anemia, unspecified: Secondary | ICD-10-CM

## 2019-01-14 LAB — CMP (CANCER CENTER ONLY)
ALT: 9 U/L (ref 0–44)
AST: <6 U/L — ABNORMAL LOW (ref 15–41)
Albumin: 3.4 g/dL — ABNORMAL LOW (ref 3.5–5.0)
Alkaline Phosphatase: 99 U/L (ref 38–126)
Anion gap: 13 (ref 5–15)
BUN: 15 mg/dL (ref 6–20)
CO2: 23 mmol/L (ref 22–32)
Calcium: 8.2 mg/dL — ABNORMAL LOW (ref 8.9–10.3)
Chloride: 107 mmol/L (ref 98–111)
Creatinine: 1.15 mg/dL — ABNORMAL HIGH (ref 0.44–1.00)
GFR, Est AFR Am: >60 mL/min (ref >60)
GFR, Estimated: 52 mL/min — ABNORMAL LOW (ref >60)
Glucose, Bld: 133 mg/dL — ABNORMAL HIGH (ref 70–99)
Potassium: 3.4 mmol/L — ABNORMAL LOW (ref 3.5–5.1)
Sodium: 143 mmol/L (ref 135–145)
Total Bilirubin: 0.5 mg/dL (ref 0.3–1.2)
Total Protein: 6.8 g/dL (ref 6.5–8.1)

## 2019-01-14 LAB — CBC WITH DIFFERENTIAL (CANCER CENTER ONLY)
Abs Immature Granulocytes: 0.02 10*3/uL (ref 0.00–0.07)
Basophils Absolute: 0 10*3/uL (ref 0.0–0.1)
Basophils Relative: 0 %
Eosinophils Absolute: 0 10*3/uL (ref 0.0–0.5)
Eosinophils Relative: 1 %
HCT: 25.2 % — ABNORMAL LOW (ref 36.0–46.0)
Hemoglobin: 8 g/dL — ABNORMAL LOW (ref 12.0–15.0)
Immature Granulocytes: 1 %
Lymphocytes Relative: 29 %
Lymphs Abs: 1.1 10*3/uL (ref 0.7–4.0)
MCH: 28.3 pg (ref 26.0–34.0)
MCHC: 31.7 g/dL (ref 30.0–36.0)
MCV: 89 fL (ref 80.0–100.0)
Monocytes Absolute: 0.3 10*3/uL (ref 0.1–1.0)
Monocytes Relative: 8 %
Neutro Abs: 2.4 10*3/uL (ref 1.7–7.7)
Neutrophils Relative %: 61 %
Platelet Count: 104 10*3/uL — ABNORMAL LOW (ref 150–400)
RBC: 2.83 MIL/uL — ABNORMAL LOW (ref 3.87–5.11)
RDW: 23.6 % — ABNORMAL HIGH (ref 11.5–15.5)
WBC Count: 3.9 10*3/uL — ABNORMAL LOW (ref 4.0–10.5)
nRBC: 0 % (ref 0.0–0.2)

## 2019-01-14 LAB — SAMPLE TO BLOOD BANK

## 2019-01-14 LAB — MAGNESIUM: Magnesium: 1.3 mg/dL — CL (ref 1.7–2.4)

## 2019-01-14 MED ORDER — HEPARIN SOD (PORK) LOCK FLUSH 100 UNIT/ML IV SOLN
500.0000 [IU] | Freq: Once | INTRAVENOUS | Status: AC | PRN
  Administered 2019-01-14: 18:00:00 500 [IU]
  Filled 2019-01-14: qty 5

## 2019-01-14 MED ORDER — SODIUM CHLORIDE 0.9% FLUSH
10.0000 mL | INTRAVENOUS | Status: DC | PRN
  Administered 2019-01-14: 10 mL
  Filled 2019-01-14: qty 10

## 2019-01-14 MED ORDER — SODIUM CHLORIDE 0.9 % IV SOLN
Freq: Once | INTRAVENOUS | Status: AC
  Administered 2019-01-14: 10:00:00 via INTRAVENOUS
  Filled 2019-01-14: qty 250

## 2019-01-14 MED ORDER — SODIUM CHLORIDE 0.9% FLUSH
10.0000 mL | INTRAVENOUS | Status: DC | PRN
  Administered 2019-01-14: 18:00:00 10 mL
  Filled 2019-01-14: qty 10

## 2019-01-14 MED ORDER — POTASSIUM CHLORIDE 2 MEQ/ML IV SOLN
Freq: Once | INTRAVENOUS | Status: AC
  Administered 2019-01-14: 11:00:00 via INTRAVENOUS
  Filled 2019-01-14: qty 10

## 2019-01-14 MED ORDER — IRINOTECAN HCL CHEMO INJECTION 100 MG/5ML
50.0000 mg/m2 | Freq: Once | INTRAVENOUS | Status: AC
  Administered 2019-01-14: 14:00:00 120 mg via INTRAVENOUS
  Filled 2019-01-14: qty 6

## 2019-01-14 MED ORDER — SODIUM CHLORIDE 0.9 % IV SOLN
25.0000 mg/m2 | Freq: Once | INTRAVENOUS | Status: AC
  Administered 2019-01-14: 16:00:00 62 mg via INTRAVENOUS
  Filled 2019-01-14: qty 62

## 2019-01-14 MED ORDER — ATROPINE SULFATE 1 MG/ML IJ SOLN
INTRAMUSCULAR | Status: AC
  Filled 2019-01-14: qty 1

## 2019-01-14 MED ORDER — SODIUM CHLORIDE 0.9 % IV SOLN
Freq: Once | INTRAVENOUS | Status: AC
  Administered 2019-01-14: 13:00:00 via INTRAVENOUS
  Filled 2019-01-14: qty 5

## 2019-01-14 MED ORDER — PALONOSETRON HCL INJECTION 0.25 MG/5ML
0.2500 mg | Freq: Once | INTRAVENOUS | Status: AC
  Administered 2019-01-14: 13:00:00 0.25 mg via INTRAVENOUS

## 2019-01-14 MED ORDER — PALONOSETRON HCL INJECTION 0.25 MG/5ML
INTRAVENOUS | Status: AC
  Filled 2019-01-14: qty 5

## 2019-01-14 MED ORDER — ATROPINE SULFATE 1 MG/ML IJ SOLN
0.5000 mg | Freq: Once | INTRAMUSCULAR | Status: AC | PRN
  Administered 2019-01-14: 14:00:00 0.5 mg via INTRAVENOUS

## 2019-01-14 NOTE — Telephone Encounter (Signed)
Scheduled appt per 9/29 los - pt to get an updated schedule next visit in treatment area.

## 2019-01-14 NOTE — Telephone Encounter (Signed)
CRITICAL VALUE STICKER  CRITICAL VALUE: Mg = 1.3.  RECEIVER (on-site recipient of call): Mining engineer, Triage [facility].   DATE & TIME NOTIFIED: 01/14/2019 at 1002.   MESSENGER (representative from lab): Rosann Auerbach [facility].  MD NOTIFIED: Collaborative for Dr. Julien Nordmann.  TIME OF NOTIFICATION: 01/14/2019 at 1011.  RESPONSE: None.

## 2019-01-14 NOTE — Progress Notes (Signed)
Per Dr. Julien Nordmann okay to proceed with treatment with zero urine output

## 2019-01-14 NOTE — Patient Instructions (Signed)
[facility] Discharge Instructions for Patients Receiving Chemotherapy  Today you received the following chemotherapy agents Cisplatin and Irintotecan  To help prevent nausea and vomiting after your treatment, we encourage you to take your nausea medication as directed If you develop nausea and vomiting that is not controlled by your nausea medication, call the clinic.   BELOW ARE SYMPTOMS THAT SHOULD BE REPORTED IMMEDIATELY:  *FEVER GREATER THAN 100.5 F  *CHILLS WITH OR WITHOUT FEVER  NAUSEA AND VOMITING THAT IS NOT CONTROLLED WITH YOUR NAUSEA MEDICATION  *UNUSUAL SHORTNESS OF BREATH  *UNUSUAL BRUISING OR BLEEDING  TENDERNESS IN MOUTH AND THROAT WITH OR WITHOUT PRESENCE OF ULCERS  *URINARY PROBLEMS  *BOWEL PROBLEMS  UNUSUAL RASH Items with * indicate a potential emergency and should be followed up as soon as possible.  Feel free to call the clinic should you have any questions or concerns. The clinic phone number is [phone].  Please show the [facility] at check-in to the Emergency Department and triage nurse.

## 2019-01-14 NOTE — Progress Notes (Signed)
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  PRIOR THERAPY:  1) Carboplatin for AUC of 5 on day 1, etoposide 100 mg/M2 on days 1, 2 and 3 with Neulasta support in addition to [place].First dose February 10th, 2020. Status post 4 cycles. 2) Maintenance treatment with Tecentriq 1200 mg IV every 3 weeks.  First dose Sep 10, 2018.  Status post 3 cycles.  This was discontinued secondary to disease progression.   CURRENT THERAPY:  Systemic chemotherapy with cisplatin 30 mg/M2 and irinotecan 65 mg/M2 on days 1 and 8 every 3 weeks.  First dose October 28, 2018.  Status post 3 cycles.  INTERVAL HISTORY: Samantha Santos 58 y.o. female returns to the clinic today for follow-up visit.  The patient is feeling fine today with no concerning complaints except for mild fatigue.  She has been tolerating her systemic chemotherapy fairly well.  She denied having any chest pain, shortness of breath, cough or hemoptysis.  She denied having any fever or chills.  She has no nausea, vomiting, diarrhea or constipation.  She has no headache or visual changes.  She is here today for evaluation before starting cycle #4.   MEDICAL HISTORY: Past Medical History:  Diagnosis Date  . Anemia   . Arthritis   . GERD (gastroesophageal reflux disease)   . Hypertension   . Morbid obesity ([place]) 05/06/2018  . Supraclavicular adenopathy     ALLERGIES:  is allergic to penicillins and oxycodone.  MEDICATIONS:  Current Outpatient Medications  Medication Sig Dispense Refill  . diphenoxylate-atropine (LOMOTIL) 2.5-0.025 MG tablet 1 to 2 tablets 4 times daily as needed for diarrhea 40 tablet 1  . gabapentin (NEURONTIN) 100 MG capsule Take 1 capsule (100 mg total) by mouth  at bedtime. 30 capsule 2  . lidocaine-prilocaine (EMLA) cream Apply 1 application topically as needed (chemo port). (Patient not taking: Reported on 12/03/2018) 30 g 0  . loratadine (CLARITIN) 10 MG tablet Take 10 mg by mouth daily.    Marland Kitchen LORazepam (ATIVAN) 0.5 MG tablet Place 1 tablet (0.5 mg total) under the tongue every 6 (six) hours as needed (Nausea). 30 tablet 1  . losartan-hydrochlorothiazide (HYZAAR) 100-12.5 MG tablet Take 1 tablet by mouth daily.    . prochlorperazine (COMPAZINE) 10 MG tablet Take 1 tablet (10 mg total) by mouth every 6 (six) hours as needed for nausea or vomiting. 50 tablet 3  . rivaroxaban (XARELTO) 20 MG TABS tablet Take 1 tablet (20 mg total) by mouth daily with supper. 30 tablet 2  . traMADol (ULTRAM) 50 MG tablet Take 1 tablet (50 mg total) by mouth every 6 (six) hours as needed. 20 tablet 0   No current facility-administered medications for this visit.     SURGICAL HISTORY:  Past Surgical History:  Procedure Laterality Date  . CESAREAN SECTION  1988  . HYSTEROSCOPY W/D&C  06/14/2011   Procedure: DILATATION AND CURETTAGE /HYSTEROSCOPY;  Surgeon: Frederico Hamman, MD;  Location: [facility];  Service: Gynecology;  Laterality: N/A;  . IR CV LINE INJECTION  06/25/2018  . PORTACATH PLACEMENT Left 06/13/2018   Procedure: INSERTION PORT-A-CATH;  Surgeon: [name], MD;  Location: [facility];  Service: Thoracic;  Laterality: Left;  . SUPRACLAVICAL NODE BIOPSY Right 05/10/2018  Procedure: SUPRACLAVICAL NODE BIOPSY;  Surgeon: [name], MD;  Location: [facility];  Service: Thoracic;  Laterality: Right;  . TUBAL LIGATION      REVIEW OF SYSTEMS:  Constitutional: positive for fatigue Eyes: negative Ears, nose, mouth, throat, and face: negative Respiratory: negative Cardiovascular: negative Gastrointestinal: negative Genitourinary:negative Integument/breast: negative Hematologic/lymphatic: negative Musculoskeletal:negative Neurological: negative  Behavioral/Psych: negative Endocrine: negative Allergic/Immunologic: negative   PHYSICAL EXAMINATION: General appearance: alert, cooperative and no distress Head: Normocephalic, without obvious abnormality, atraumatic Neck: no adenopathy, no JVD, supple, symmetrical, trachea midline and thyroid not enlarged, symmetric, no tenderness/mass/nodules Lymph nodes: Cervical, supraclavicular, and axillary nodes normal. Resp: clear to auscultation bilaterally Back: symmetric, no curvature. ROM normal. No CVA tenderness. Cardio: regular rate and rhythm, S1, S2 normal, no murmur, click, rub or gallop GI: soft, non-tender; bowel sounds normal; no masses,  no organomegaly Extremities: extremities normal, atraumatic, no cyanosis or edema  ECOG PERFORMANCE STATUS: 1 - Symptomatic but completely ambulatory  Blood pressure (!) 132/98, pulse (!) 111, temperature 98.7 F (37.1 C), temperature source Oral, resp. rate 18, height 5\' 7"  (1.702 m), weight 282 lb 4.8 oz (128.1 kg), SpO2 99 %.  LABORATORY DATA: Lab Results  Component Value Date   WBC 3.9 (L) 01/14/2019   HGB 8.0 (L) 01/14/2019   HCT 25.2 (L) 01/14/2019   MCV 89.0 01/14/2019   PLT 104 (L) 01/14/2019      Chemistry      Component Value Date/Time   NA 142 12/31/2018 0820   NA 144 04/08/2018 1145   K 3.8 12/31/2018 0820   CL 107 12/31/2018 0820   CO2 28 12/31/2018 0820   BUN 13 12/31/2018 0820   BUN 10 04/08/2018 1145   CREATININE 1.22 (H) 12/31/2018 0820      Component Value Date/Time   CALCIUM 8.5 (L) 12/31/2018 0820   ALKPHOS 91 12/31/2018 0820   AST 7 (L) 12/31/2018 0820   ALT 10 12/31/2018 0820   BILITOT 0.4 12/31/2018 0820       RADIOGRAPHIC STUDIES: Ct Chest W Contrast  Result Date: 12/18/2018 CLINICAL DATA:  Ongoing chemotherapy and immunotherapy for lung cancer diagnosed this year. EXAM: CT CHEST, ABDOMEN, AND PELVIS WITH CONTRAST TECHNIQUE: Multidetector CT imaging of the chest, abdomen and pelvis was performed  following the standard protocol during bolus administration of intravenous contrast. CONTRAST:  136mL OMNIPAQUE IOHEXOL 300 MG/ML  SOLN COMPARISON:  10/16/2018 FINDINGS: CT CHEST FINDINGS Cardiovascular: Left subclavian porta catheter tip in the superior aspect of the inferior vena cava. Decrease in size of a small pericardial effusion, currently measuring 13 mm in maximum thickness. Normal sized heart. Minimal atheromatous aortic calcification. Mediastinum/Nodes: Subcentimeter thyroid nodules. Again demonstrated is an anterior mediastinal mass. This currently measures 5.7 x 3.4 cm on image number 17 series 2, previously 6.3 x 3.3 cm. Left anterior prevascular enlarged lymph nodes measuring a combined 3.5 x 1.5 cm on image number 20 series 2, previously 2.8 x 2.1 cm. Enlarged left axillary lymph node with a short axis diameter of 14 mm on image number 18 series 2, previously 17 mm. Left supraclavicular node with a short axis diameter of 13 mm on image number 6 series 2, previously 12 mm. Index right supraclavicular node with a short axis diameter 15 mm on image number 2 series 2, not included in its entirety. This previously had a short axis diameter of 22 mm. Multiple enlarged right lower epicardial and CP angle nodes. The largest has a short axis diameter of 22 mm on  image number 36 series 18, previously 19 mm. No new enlarged nodes are seen. Lungs/Pleura: Consolidative left lower lobe atelectasis with air bronchograms has not changed significantly. No pleural fluid. Musculoskeletal: Thoracic spine degenerative changes and dextroconvex scoliosis. Less prominent sclerosis of the manubrium. CT ABDOMEN PELVIS FINDINGS Hepatobiliary: No focal liver abnormality is seen. No gallstones, gallbladder wall thickening, or biliary dilatation. Pancreas: Unremarkable. No pancreatic ductal dilatation or surrounding inflammatory changes. Spleen: No significant change in 2 small rounded areas of low density. Adrenals/Urinary  Tract: Stable bilateral renal cysts. Unremarkable adrenal glands, ureters and urinary bladder. Stomach/Bowel: Stomach is within normal limits. Appendix appears normal. No evidence of bowel wall thickening, distention, or inflammatory changes. Vascular/Lymphatic: No significant vascular findings are present. No enlarged abdominal or pelvic lymph nodes. Reproductive: Uterus and bilateral adnexa are unremarkable. Other: No abdominal wall hernia or abnormality. No abdominopelvic ascites. Musculoskeletal: Minimal lumbar spine degenerative changes and mild scoliosis. IMPRESSION: 1. Mild progression of some of the metastatic adenopathy involving the chest and mild improvement of other adenopathy. The overall amount of adenopathy is similar to the previous examination. 2. Stable left lower lobe atelectasis. 3. The left subclavian porta catheter tip is in the superior aspect of the inferior vena cava. 4. No significant change in 2 small rounded areas of low density in the spleen. These could represent small cysts or hemangiomas. Metastases are unlikely. 5. Less prominent sclerosis of the manubrium, compatible with treated metastatic disease. 6. Mild decrease in size of a small pericardial effusion. 7. Sub-centimeter thyroid nodule(s) noted, too small to characterize, but most likely benign in the absence of known clinical risk factors for thyroid carcinoma. Aortic Atherosclerosis (ICD10-I70.0). Electronically Signed   By: Claudie Revering M.D.   On: 12/18/2018 08:48   Ct Abdomen Pelvis W Contrast  Result Date: 12/18/2018 CLINICAL DATA:  Ongoing chemotherapy and immunotherapy for lung cancer diagnosed this year. EXAM: CT CHEST, ABDOMEN, AND PELVIS WITH CONTRAST TECHNIQUE: Multidetector CT imaging of the chest, abdomen and pelvis was performed following the standard protocol during bolus administration of intravenous contrast. CONTRAST:  133mL OMNIPAQUE IOHEXOL 300 MG/ML  SOLN COMPARISON:  10/16/2018 FINDINGS: CT CHEST FINDINGS  Cardiovascular: Left subclavian porta catheter tip in the superior aspect of the inferior vena cava. Decrease in size of a small pericardial effusion, currently measuring 13 mm in maximum thickness. Normal sized heart. Minimal atheromatous aortic calcification. Mediastinum/Nodes: Subcentimeter thyroid nodules. Again demonstrated is an anterior mediastinal mass. This currently measures 5.7 x 3.4 cm on image number 17 series 2, previously 6.3 x 3.3 cm. Left anterior prevascular enlarged lymph nodes measuring a combined 3.5 x 1.5 cm on image number 20 series 2, previously 2.8 x 2.1 cm. Enlarged left axillary lymph node with a short axis diameter of 14 mm on image number 18 series 2, previously 17 mm. Left supraclavicular node with a short axis diameter of 13 mm on image number 6 series 2, previously 12 mm. Index right supraclavicular node with a short axis diameter 15 mm on image number 2 series 2, not included in its entirety. This previously had a short axis diameter of 22 mm. Multiple enlarged right lower epicardial and CP angle nodes. The largest has a short axis diameter of 22 mm on image number 36 series 18, previously 19 mm. No new enlarged nodes are seen. Lungs/Pleura: Consolidative left lower lobe atelectasis with air bronchograms has not changed significantly. No pleural fluid. Musculoskeletal: Thoracic spine degenerative changes and dextroconvex scoliosis. Less prominent sclerosis of the manubrium. CT ABDOMEN  PELVIS FINDINGS Hepatobiliary: No focal liver abnormality is seen. No gallstones, gallbladder wall thickening, or biliary dilatation. Pancreas: Unremarkable. No pancreatic ductal dilatation or surrounding inflammatory changes. Spleen: No significant change in 2 small rounded areas of low density. Adrenals/Urinary Tract: Stable bilateral renal cysts. Unremarkable adrenal glands, ureters and urinary bladder. Stomach/Bowel: Stomach is within normal limits. Appendix appears normal. No evidence of bowel wall  thickening, distention, or inflammatory changes. Vascular/Lymphatic: No significant vascular findings are present. No enlarged abdominal or pelvic lymph nodes. Reproductive: Uterus and bilateral adnexa are unremarkable. Other: No abdominal wall hernia or abnormality. No abdominopelvic ascites. Musculoskeletal: Minimal lumbar spine degenerative changes and mild scoliosis. IMPRESSION: 1. Mild progression of some of the metastatic adenopathy involving the chest and mild improvement of other adenopathy. The overall amount of adenopathy is similar to the previous examination. 2. Stable left lower lobe atelectasis. 3. The left subclavian porta catheter tip is in the superior aspect of the inferior vena cava. 4. No significant change in 2 small rounded areas of low density in the spleen. These could represent small cysts or hemangiomas. Metastases are unlikely. 5. Less prominent sclerosis of the manubrium, compatible with treated metastatic disease. 6. Mild decrease in size of a small pericardial effusion. 7. Sub-centimeter thyroid nodule(s) noted, too small to characterize, but most likely benign in the absence of known clinical risk factors for thyroid carcinoma. Aortic Atherosclerosis (ICD10-I70.0). Electronically Signed   By: Claudie Revering M.D.   On: 12/18/2018 08:48    ASSESSMENT AND PLAN: This is a very pleasant 58 years old African-American female with stage IV high-grade neuroendocrine carcinoma and underwent systemic chemotherapy with carboplatin and etoposide and Tecentriq status post 7 cycles. Starting from cycle #5 the patient has been on treatment with maintenance Tecentriq status post 3 cycles. This treatment was discontinued secondary to disease progression. The patient is currently on treatment with systemic chemotherapy with cisplatin 30 mg/M2 and irinotecan 65 mg/M2 on days 1 and 8 every 3 weeks status post 3 cycles. She has been tolerating this treatment well except for fatigue secondary to  chemotherapy-induced anemia. I recommended for the patient to proceed with cycle #4 today as planned. I will see her back for follow-up visit in 3 weeks for evaluation before the next cycle of her treatment. For the chemotherapy-induced anemia, I will arrange for the patient to receive 1 unit of PRBCs transfusion. For the hypomagnesemia, I will arrange for the patient to receive 4 g of magnesium sulfate intravenously during her treatment. She was advised to call immediately if she has any concerning symptoms in the interval. All questions were answered. The patient knows to call the clinic with any problems, questions or concerns. We can certainly see the patient much sooner if necessary.  Disclaimer: This note was dictated with voice recognition software. Similar sounding words can inadvertently be transcribed and may not be corrected upon review.

## 2019-01-15 ENCOUNTER — Inpatient Hospital Stay: Payer: Medicaid Other

## 2019-01-15 ENCOUNTER — Other Ambulatory Visit: Payer: Self-pay

## 2019-01-15 DIAGNOSIS — Z5111 Encounter for antineoplastic chemotherapy: Secondary | ICD-10-CM | POA: Diagnosis not present

## 2019-01-15 DIAGNOSIS — D649 Anemia, unspecified: Secondary | ICD-10-CM

## 2019-01-15 LAB — ABO/RH: ABO/RH(D): O POS

## 2019-01-15 LAB — PREPARE RBC (CROSSMATCH)

## 2019-01-15 MED ORDER — ACETAMINOPHEN 325 MG PO TABS
650.0000 mg | ORAL_TABLET | Freq: Once | ORAL | Status: AC
  Administered 2019-01-15: 650 mg via ORAL

## 2019-01-15 MED ORDER — DIPHENHYDRAMINE HCL 25 MG PO CAPS
25.0000 mg | ORAL_CAPSULE | Freq: Once | ORAL | Status: AC
  Administered 2019-01-15: 08:00:00 25 mg via ORAL

## 2019-01-15 MED ORDER — HEPARIN SOD (PORK) LOCK FLUSH 100 UNIT/ML IV SOLN
500.0000 [IU] | Freq: Every day | INTRAVENOUS | Status: AC | PRN
  Administered 2019-01-15: 500 [IU]
  Filled 2019-01-15: qty 5

## 2019-01-15 MED ORDER — DIPHENHYDRAMINE HCL 25 MG PO CAPS
ORAL_CAPSULE | ORAL | Status: AC
  Filled 2019-01-15: qty 1

## 2019-01-15 MED ORDER — SODIUM CHLORIDE 0.9% IV SOLUTION
250.0000 mL | Freq: Once | INTRAVENOUS | Status: AC
  Administered 2019-01-15: 250 mL via INTRAVENOUS
  Filled 2019-01-15: qty 250

## 2019-01-15 MED ORDER — SODIUM CHLORIDE 0.9% FLUSH
10.0000 mL | INTRAVENOUS | Status: AC | PRN
  Administered 2019-01-15: 10 mL
  Filled 2019-01-15: qty 10

## 2019-01-15 MED ORDER — ACETAMINOPHEN 325 MG PO TABS
ORAL_TABLET | ORAL | Status: AC
  Filled 2019-01-15: qty 2

## 2019-01-15 NOTE — Patient Instructions (Signed)
Blood Transfusion, Adult, Care After This sheet gives you information about how to care for yourself after your procedure. Your doctor may also give you more specific instructions. If you have problems or questions, contact your doctor. Follow these instructions at home:   Take over-the-counter and prescription medicines only as told by your doctor.  Go back to your normal activities as told by your doctor.  Follow instructions from your doctor about how to take care of the area where an IV tube was put into your vein (insertion site). Make sure you: ? Wash your hands with soap and water before you change your bandage (dressing). If there is no soap and water, use hand sanitizer. ? Change your bandage as told by your doctor.  Check your IV insertion site every day for signs of infection. Check for: ? More redness, swelling, or pain. ? More fluid or blood. ? Warmth. ? Pus or a bad smell. Contact a doctor if:  You have more redness, swelling, or pain around the IV insertion site.  You have more fluid or blood coming from the IV insertion site.  Your IV insertion site feels warm to the touch.  You have pus or a bad smell coming from the IV insertion site.  Your pee (urine) turns pink, red, or brown.  You feel weak after doing your normal activities. Get help right away if:  You have signs of a serious allergic or body defense (immune) system reaction, including: ? Itchiness. ? Hives. ? Trouble breathing. ? Anxiety. ? Pain in your chest or lower back. ? Fever, flushing, and chills. ? Fast pulse. ? Rash. ? Watery poop (diarrhea). ? Throwing up (vomiting). ? Dark pee. ? Serious headache. ? Dizziness. ? Stiff neck. ? Yellow color in your face or the white parts of your eyes (jaundice). Summary  After a blood transfusion, return to your normal activities as told by your doctor.  Every day, check for signs of infection where the IV tube was put into your vein.  Some  signs of infection are warm skin, more redness and pain, more fluid or blood, and pus or a bad smell where the needle went in.  Contact your doctor if you feel weak or have any unusual symptoms. This information is not intended to replace advice given to you by your health care provider. Make sure you discuss any questions you have with your health care provider. Document Released: 04/24/2014 Document Revised: 08/08/2017 Document Reviewed: 11/26/2015 Elsevier Patient Education  2020 Elsevier Inc.  

## 2019-01-16 LAB — BPAM RBC
Blood Product Expiration Date: 202011012359
ISSUE DATE / TIME: 202009300856
Unit Type and Rh: 5100

## 2019-01-16 LAB — TYPE AND SCREEN
ABO/RH(D): O POS
Antibody Screen: NEGATIVE
Unit division: 0

## 2019-01-21 ENCOUNTER — Inpatient Hospital Stay: Payer: Medicaid Other | Attending: Internal Medicine

## 2019-01-21 ENCOUNTER — Inpatient Hospital Stay: Payer: Medicaid Other

## 2019-01-21 ENCOUNTER — Other Ambulatory Visit: Payer: Self-pay

## 2019-01-21 DIAGNOSIS — C3490 Malignant neoplasm of unspecified part of unspecified bronchus or lung: Secondary | ICD-10-CM | POA: Insufficient documentation

## 2019-01-21 DIAGNOSIS — D6481 Anemia due to antineoplastic chemotherapy: Secondary | ICD-10-CM | POA: Insufficient documentation

## 2019-01-21 DIAGNOSIS — R222 Localized swelling, mass and lump, trunk: Secondary | ICD-10-CM | POA: Diagnosis not present

## 2019-01-21 DIAGNOSIS — R112 Nausea with vomiting, unspecified: Secondary | ICD-10-CM | POA: Diagnosis not present

## 2019-01-21 DIAGNOSIS — Z5111 Encounter for antineoplastic chemotherapy: Secondary | ICD-10-CM | POA: Insufficient documentation

## 2019-01-21 DIAGNOSIS — G893 Neoplasm related pain (acute) (chronic): Secondary | ICD-10-CM | POA: Diagnosis not present

## 2019-01-21 DIAGNOSIS — T451X5A Adverse effect of antineoplastic and immunosuppressive drugs, initial encounter: Secondary | ICD-10-CM | POA: Diagnosis not present

## 2019-01-21 DIAGNOSIS — C7A1 Malignant poorly differentiated neuroendocrine tumors: Secondary | ICD-10-CM | POA: Diagnosis not present

## 2019-01-21 DIAGNOSIS — Z79899 Other long term (current) drug therapy: Secondary | ICD-10-CM | POA: Insufficient documentation

## 2019-01-21 DIAGNOSIS — Z95828 Presence of other vascular implants and grafts: Secondary | ICD-10-CM

## 2019-01-21 DIAGNOSIS — C3412 Malignant neoplasm of upper lobe, left bronchus or lung: Secondary | ICD-10-CM

## 2019-01-21 LAB — CBC WITH DIFFERENTIAL (CANCER CENTER ONLY)
Abs Immature Granulocytes: 0.03 10*3/uL (ref 0.00–0.07)
Basophils Absolute: 0 10*3/uL (ref 0.0–0.1)
Basophils Relative: 0 %
Eosinophils Absolute: 0 10*3/uL (ref 0.0–0.5)
Eosinophils Relative: 0 %
HCT: 27.6 % — ABNORMAL LOW (ref 36.0–46.0)
Hemoglobin: 9 g/dL — ABNORMAL LOW (ref 12.0–15.0)
Immature Granulocytes: 1 %
Lymphocytes Relative: 35 %
Lymphs Abs: 1.2 10*3/uL (ref 0.7–4.0)
MCH: 28.5 pg (ref 26.0–34.0)
MCHC: 32.6 g/dL (ref 30.0–36.0)
MCV: 87.3 fL (ref 80.0–100.0)
Monocytes Absolute: 0.3 10*3/uL (ref 0.1–1.0)
Monocytes Relative: 7 %
Neutro Abs: 2 10*3/uL (ref 1.7–7.7)
Neutrophils Relative %: 57 %
Platelet Count: 81 10*3/uL — ABNORMAL LOW (ref 150–400)
RBC: 3.16 MIL/uL — ABNORMAL LOW (ref 3.87–5.11)
RDW: 20.1 % — ABNORMAL HIGH (ref 11.5–15.5)
WBC Count: 3.5 10*3/uL — ABNORMAL LOW (ref 4.0–10.5)
nRBC: 0 % (ref 0.0–0.2)

## 2019-01-21 LAB — CMP (CANCER CENTER ONLY)
ALT: 8 U/L (ref 0–44)
AST: 7 U/L — ABNORMAL LOW (ref 15–41)
Albumin: 3.4 g/dL — ABNORMAL LOW (ref 3.5–5.0)
Alkaline Phosphatase: 96 U/L (ref 38–126)
Anion gap: 10 (ref 5–15)
BUN: 13 mg/dL (ref 6–20)
CO2: 26 mmol/L (ref 22–32)
Calcium: 8.5 mg/dL — ABNORMAL LOW (ref 8.9–10.3)
Chloride: 106 mmol/L (ref 98–111)
Creatinine: 1.2 mg/dL — ABNORMAL HIGH (ref 0.44–1.00)
GFR, Est AFR Am: 58 mL/min — ABNORMAL LOW (ref >60)
GFR, Estimated: 50 mL/min — ABNORMAL LOW (ref >60)
Glucose, Bld: 125 mg/dL — ABNORMAL HIGH (ref 70–99)
Potassium: 3.9 mmol/L (ref 3.5–5.1)
Sodium: 142 mmol/L (ref 135–145)
Total Bilirubin: 0.5 mg/dL (ref 0.3–1.2)
Total Protein: 7 g/dL (ref 6.5–8.1)

## 2019-01-21 LAB — MAGNESIUM: Magnesium: 1.5 mg/dL — ABNORMAL LOW (ref 1.7–2.4)

## 2019-01-21 MED ORDER — HEPARIN SOD (PORK) LOCK FLUSH 100 UNIT/ML IV SOLN
500.0000 [IU] | Freq: Once | INTRAVENOUS | Status: AC | PRN
  Administered 2019-01-21: 500 [IU]
  Filled 2019-01-21: qty 5

## 2019-01-21 MED ORDER — ATROPINE SULFATE 1 MG/ML IJ SOLN
INTRAMUSCULAR | Status: AC
  Filled 2019-01-21: qty 1

## 2019-01-21 MED ORDER — SODIUM CHLORIDE 0.9% FLUSH
10.0000 mL | INTRAVENOUS | Status: DC | PRN
  Administered 2019-01-21: 09:00:00 10 mL
  Filled 2019-01-21: qty 10

## 2019-01-21 MED ORDER — SODIUM CHLORIDE 0.9 % IV SOLN
2.0000 g | Freq: Once | INTRAVENOUS | Status: AC
  Administered 2019-01-21: 2 g via INTRAVENOUS
  Filled 2019-01-21: qty 4

## 2019-01-21 MED ORDER — PALONOSETRON HCL INJECTION 0.25 MG/5ML
INTRAVENOUS | Status: AC
  Filled 2019-01-21: qty 5

## 2019-01-21 MED ORDER — SODIUM CHLORIDE 0.9% FLUSH
10.0000 mL | INTRAVENOUS | Status: DC | PRN
  Administered 2019-01-21: 10 mL
  Filled 2019-01-21: qty 10

## 2019-01-21 NOTE — Progress Notes (Signed)
Platelets = 81. Pt. denies any signs of bleeding and Magnesium= 1.5. Dr. Julien Nordmann notified, treatment cancelled for today and Magnesium orders entered. Pt. to return for treatment in 2 weeks.

## 2019-01-21 NOTE — Patient Instructions (Addendum)
[facility] Discharge Instructions for Patients Receiving Chemotherapy  Hypomagnesemia Hypomagnesemia is a condition in which the level of magnesium in the blood is low. Magnesium is a mineral that is found in many foods. It is used in many different processes in the body. Hypomagnesemia can affect every organ in the body. In severe cases, it can cause life-threatening problems. What are the causes? This condition may be caused by:  Not getting enough magnesium in your diet.  Malnutrition.  Problems with absorbing magnesium from the intestines.  Dehydration.  Alcohol abuse.  Vomiting.  Severe or chronic diarrhea.  Some medicines, including medicines that make you urinate more (diuretics).  Certain diseases, such as kidney disease, diabetes, celiac disease, and overactive thyroid. What are the signs or symptoms? Symptoms of this condition include:  Loss of appetite.  Nausea and vomiting.  Involuntary shaking or trembling of a body part (tremor).  Muscle weakness.  Tingling in the arms and legs.  Sudden tightening of muscles (muscle spasms).  Confusion.  Psychiatric issues, such as depression, irritability, or psychosis.  A feeling of fluttering of the heart.  Seizures. These symptoms are more severe if magnesium levels drop suddenly. How is this diagnosed? This condition may be diagnosed based on:  Your symptoms and medical history.  A physical exam.  Blood and urine tests. How is this treated? Treatment depends on the cause and the severity of the condition. It may be treated with:  A magnesium supplement. This can be taken in pill form. If the condition is severe, magnesium is usually given through an IV.  Changes to your diet. You may be directed to eat foods that have a lot of magnesium, such as green leafy vegetables, peas, beans, and nuts.  Stopping any intake of alcohol. Follow these instructions at home:      Make sure that  your diet includes foods with magnesium. Foods that have a lot of magnesium in them include: ? Green leafy vegetables, such as spinach and broccoli. ? Beans and peas. ? Nuts and seeds, such as almonds and sunflower seeds. ? Whole grains, such as whole grain bread and fortified cereals.  Take magnesium supplements if your health care provider tells you to do that. Take them as directed.  Take over-the-counter and prescription medicines only as told by your health care provider.  Have your magnesium levels monitored as told by your health care provider.  When you are active, drink fluids that contain electrolytes.  Avoid drinking alcohol.  Keep all follow-up visits as told by your health care provider. This is important. Contact a health care provider if:  You get worse instead of better.  Your symptoms return. Get help right away if you:  Develop severe muscle weakness.  Have trouble breathing.  Feel that your heart is racing. Summary  Hypomagnesemia is a condition in which the level of magnesium in the blood is low.  Hypomagnesemia can affect every organ in the body.  Treatment may include eating more foods that contain magnesium, taking magnesium supplements, and not drinking alcohol.  Have your magnesium levels monitored as told by your health care provider. This information is not intended to replace advice given to you by your health care provider. Make sure you discuss any questions you have with your health care provider. Document Released: 12/28/2004 Document Revised: 03/16/2017 Document Reviewed: 03/05/2017 Elsevier Patient Education  2020 [employer]  No results for input(s): DDIMER, FERRITIN, LDH, CRP in the last 72 hours.  No results found for: [id number]   Thrombocytopenia Thrombocytopenia means that you have a low number of platelets in your blood. Platelets are tiny cells in the blood. When you bleed, they clump together at the cut or  injury to stop the bleeding. This is called blood clotting. If you do not have enough platelets, it can cause bleeding problems. Some cases of this condition are mild while others are more severe. What are the causes? This condition may be caused by:  Your body not making enough platelets. This may be caused by: ? Your bone marrow not making blood cells (aplastic anemia). ? Cancer in the bone marrow. ? Certain medicines. ? Infection in the bone marrow. ? Drinking a lot of alcohol.  Your body destroying platelets too quickly. This may be caused by: ? Certain immune diseases. ? Certain medicines. ? Certain blood clotting disorders. ? Certain disorders that are passed from parent to child (inherited). ? Certain bleeding disorders. ? Pregnancy. ? Having a spleen that is larger than normal. What are the signs or symptoms?  Bleeding that is not normal.  Nosebleeds.  Heavy menstrual periods.  Blood in the pee (urine) or poop (stool).  A purple-like color to the skin (purpura).  Bruising.  A rash that looks like pinpoint, purple-red spots (petechiae). How is this treated?  Treatment of another condition that is causing the low platelet count.  Medicines to help protect your platelets from being destroyed.  A replacement (transfusion) of platelets to stop or prevent bleeding.  Surgery to remove the spleen. Follow these instructions at home: Activity  Avoid activities that could cause you to get hurt or bruised. Follow instructions about how to prevent falls.  Take care not to cut yourself: ? When you shave. ? When you use scissors, needles, knives, or other tools.  Take care not to burn yourself: ? When you use an iron. ? When you [place]. General instructions   Check your skin and the inside of your mouth for bruises or blood as told by your doctor.  Check to see if there is blood in your spit (sputum), pee, and poop. Do this as told by your doctor.  Do not drink  alcohol.  Take over-the-counter and prescription medicines only as told by your doctor.  Do not take any medicines that have aspirin or NSAIDs in them. These medicines can thin your blood and cause you to bleed.  Tell all of your doctors that you have this condition. Be sure to tell your dentist and eye doctor too. Contact a doctor if:  You have bruises and you do not know why. Get help right away if:  You are bleeding anywhere on your body.  You have blood in your spit, pee, or poop. Summary  Thrombocytopenia means that you have a low number of platelets in your blood.  Platelets are needed for blood clotting.  Symptoms of this condition include bleeding that is not normal, and bruising.  Take care not to cut or burn yourself. This information is not intended to replace advice given to you by your health care provider. Make sure you discuss any questions you have with your health care provider. Document Released: 03/23/2011 Document Revised: 01/03/2018 Document Reviewed: 01/03/2018 Elsevier Patient Education  2020 [employer].

## 2019-01-22 ENCOUNTER — Ambulatory Visit: Payer: Medicaid Other

## 2019-01-23 ENCOUNTER — Inpatient Hospital Stay: Payer: Medicaid Other

## 2019-01-27 ENCOUNTER — Other Ambulatory Visit: Payer: Self-pay

## 2019-01-27 ENCOUNTER — Inpatient Hospital Stay: Payer: Medicaid Other

## 2019-01-27 ENCOUNTER — Encounter: Payer: Self-pay | Admitting: Physician Assistant

## 2019-01-27 ENCOUNTER — Inpatient Hospital Stay (HOSPITAL_BASED_OUTPATIENT_CLINIC_OR_DEPARTMENT_OTHER): Payer: Medicaid Other | Admitting: Physician Assistant

## 2019-01-27 ENCOUNTER — Telehealth: Payer: Self-pay | Admitting: Medical Oncology

## 2019-01-27 VITALS — BP 159/94 | HR 94 | Temp 98.7°F | Resp 18 | Ht 67.0 in | Wt 274.9 lb

## 2019-01-27 DIAGNOSIS — C3412 Malignant neoplasm of upper lobe, left bronchus or lung: Secondary | ICD-10-CM

## 2019-01-27 DIAGNOSIS — R112 Nausea with vomiting, unspecified: Secondary | ICD-10-CM

## 2019-01-27 DIAGNOSIS — Z5111 Encounter for antineoplastic chemotherapy: Secondary | ICD-10-CM | POA: Diagnosis not present

## 2019-01-27 DIAGNOSIS — Z95828 Presence of other vascular implants and grafts: Secondary | ICD-10-CM

## 2019-01-27 DIAGNOSIS — G893 Neoplasm related pain (acute) (chronic): Secondary | ICD-10-CM

## 2019-01-27 LAB — CMP (CANCER CENTER ONLY)
ALT: 6 U/L (ref 0–44)
AST: 7 U/L — ABNORMAL LOW (ref 15–41)
Albumin: 3.4 g/dL — ABNORMAL LOW (ref 3.5–5.0)
Alkaline Phosphatase: 98 U/L (ref 38–126)
Anion gap: 10 (ref 5–15)
BUN: 13 mg/dL (ref 6–20)
CO2: 28 mmol/L (ref 22–32)
Calcium: 8.8 mg/dL — ABNORMAL LOW (ref 8.9–10.3)
Chloride: 104 mmol/L (ref 98–111)
Creatinine: 1.12 mg/dL — ABNORMAL HIGH (ref 0.44–1.00)
GFR, Est AFR Am: >60 mL/min (ref >60)
GFR, Estimated: 54 mL/min — ABNORMAL LOW (ref >60)
Glucose, Bld: 116 mg/dL — ABNORMAL HIGH (ref 70–99)
Potassium: 3.8 mmol/L (ref 3.5–5.1)
Sodium: 142 mmol/L (ref 135–145)
Total Bilirubin: 1 mg/dL (ref 0.3–1.2)
Total Protein: 7.3 g/dL (ref 6.5–8.1)

## 2019-01-27 LAB — CBC WITH DIFFERENTIAL (CANCER CENTER ONLY)
Abs Immature Granulocytes: 0.01 10*3/uL (ref 0.00–0.07)
Basophils Absolute: 0 10*3/uL (ref 0.0–0.1)
Basophils Relative: 0 %
Eosinophils Absolute: 0 10*3/uL (ref 0.0–0.5)
Eosinophils Relative: 0 %
HCT: 27.1 % — ABNORMAL LOW (ref 36.0–46.0)
Hemoglobin: 8.8 g/dL — ABNORMAL LOW (ref 12.0–15.0)
Immature Granulocytes: 0 %
Lymphocytes Relative: 16 %
Lymphs Abs: 0.9 10*3/uL (ref 0.7–4.0)
MCH: 28.4 pg (ref 26.0–34.0)
MCHC: 32.5 g/dL (ref 30.0–36.0)
MCV: 87.4 fL (ref 80.0–100.0)
Monocytes Absolute: 0.5 10*3/uL (ref 0.1–1.0)
Monocytes Relative: 9 %
Neutro Abs: 4 10*3/uL (ref 1.7–7.7)
Neutrophils Relative %: 75 %
Platelet Count: 108 10*3/uL — ABNORMAL LOW (ref 150–400)
RBC: 3.1 MIL/uL — ABNORMAL LOW (ref 3.87–5.11)
RDW: 19.9 % — ABNORMAL HIGH (ref 11.5–15.5)
WBC Count: 5.3 10*3/uL (ref 4.0–10.5)
nRBC: 0 % (ref 0.0–0.2)

## 2019-01-27 LAB — MAGNESIUM: Magnesium: 1.6 mg/dL — ABNORMAL LOW (ref 1.7–2.4)

## 2019-01-27 MED ORDER — MORPHINE SULFATE (PF) 4 MG/ML IV SOLN
INTRAVENOUS | Status: AC
  Filled 2019-01-27: qty 1

## 2019-01-27 MED ORDER — SODIUM CHLORIDE 0.9% FLUSH
10.0000 mL | INTRAVENOUS | Status: DC | PRN
  Administered 2019-01-27: 10 mL
  Filled 2019-01-27: qty 10

## 2019-01-27 MED ORDER — ONDANSETRON HCL 4 MG/2ML IJ SOLN
8.0000 mg | Freq: Once | INTRAMUSCULAR | Status: AC
  Administered 2019-01-27: 8 mg via INTRAVENOUS

## 2019-01-27 MED ORDER — ALTEPLASE 2 MG IJ SOLR
2.0000 mg | Freq: Once | INTRAMUSCULAR | Status: DC | PRN
  Filled 2019-01-27: qty 2

## 2019-01-27 MED ORDER — HEPARIN SOD (PORK) LOCK FLUSH 100 UNIT/ML IV SOLN
500.0000 [IU] | Freq: Once | INTRAVENOUS | Status: DC | PRN
  Filled 2019-01-27: qty 5

## 2019-01-27 MED ORDER — HEPARIN SOD (PORK) LOCK FLUSH 100 UNIT/ML IV SOLN
500.0000 [IU] | Freq: Once | INTRAVENOUS | Status: AC | PRN
  Administered 2019-01-27: 500 [IU]
  Filled 2019-01-27: qty 5

## 2019-01-27 MED ORDER — MORPHINE SULFATE (PF) 4 MG/ML IV SOLN
2.0000 mg | Freq: Once | INTRAVENOUS | Status: AC
  Administered 2019-01-27: 2 mg via INTRAVENOUS

## 2019-01-27 MED ORDER — ONDANSETRON HCL 4 MG/2ML IJ SOLN
INTRAMUSCULAR | Status: AC
  Filled 2019-01-27: qty 4

## 2019-01-27 MED ORDER — SODIUM CHLORIDE 0.9 % IV SOLN
Freq: Once | INTRAVENOUS | Status: AC
  Administered 2019-01-27: 12:00:00 via INTRAVENOUS
  Filled 2019-01-27: qty 1000

## 2019-01-27 MED ORDER — HYDROCODONE-ACETAMINOPHEN 5-325 MG PO TABS: 1.0000 | ORAL_TABLET | Freq: Four times a day (QID) | ORAL | 0 refills | Status: DC | PRN

## 2019-01-27 NOTE — Patient Instructions (Signed)

## 2019-01-27 NOTE — Progress Notes (Signed)
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  PRIOR THERAPY:  1) Carboplatin for AUC of 5 on day 1, etoposide 100 mg/M2 on days 1, 2 and 3 with Neulasta support in addition to [place].First dose February 10th, 2020. Status post 4 cycles. 2) Maintenance treatment with Tecentriq 1200 mg IV every 3 weeks.  First dose Sep 10, 2018.  Status post 3 cycles.  This was discontinued secondary to disease progression.  CURRENT THERAPY: Systemic chemotherapy with cisplatin 30 mg/M2 and irinotecan 65 mg/M2 on days 1 and 8 every 3 weeks.  First dose October 28, 2018.  Status post 4 cycles.  INTERVAL HISTORY: Samantha Santos 58 y.o. female returns to the clinic for a follow up visit. The patient did not receive day 8 of cycle #4 last week due to thrombocytopenia. The patient is due for day 1, cycle #5 of chemotherapy next week. The patient is here today for evaluation due to worsening chest/mid back pain and nausea/vomiting The patient states that this started last night. She was laying down when she started having nausea with vomiting. She is prescribed compazine for nausea and vomiting but she did not take any compazine. She took sublingual ativan this morning for nausea and vomiting without relief. She denies any constipation or diarrhea. The patient also notes that she started having worsening left sternal/left anterior chest/rib pain that radiates to her back last night. This region is sore/tender to palpation and she notes an associated "swelling" and a palpable "knot" in this region which has since resolved. However, she does note that her anterior chest is still tender to palpation. She denies any shortness of breath, coughing, or fevers. She denies any diaphoresis or radiation of pain down her  arm/neck/shoulder. She has tried to use a heating pack and to take tylenol for relief without any improvement of her symptoms. She used to be prescribed tramadol for pain but she has run out and she states that it did not help alleviate her pain. Her symptoms have been constant since last night. Her pain is not associated with any particular triggers such as eating, exertion/activity, or positioning. The patient does have a history of metastatic bone disease in the sternum. Her last restaging CT scan was 12/17/2018. She is here to evaluate and manage her symptoms.   MEDICAL HISTORY: Past Medical History:  Diagnosis Date  . Anemia   . Arthritis   . GERD (gastroesophageal reflux disease)   . Hypertension   . Morbid obesity ([place]) 05/06/2018  . Supraclavicular adenopathy     ALLERGIES:  is allergic to penicillins and oxycodone.  MEDICATIONS:  Current Outpatient Medications  Medication Sig Dispense Refill  . acetaminophen (TYLENOL) 500 MG tablet Take 500 mg by mouth every 6 (six) hours as needed.    . gabapentin (NEURONTIN) 100 MG capsule Take 1 capsule (100 mg total) by mouth at bedtime. 30 capsule 2  . lidocaine-prilocaine (EMLA) cream Apply 1 application topically as needed (chemo port). 30 g 0  . loratadine (CLARITIN) 10 MG tablet Take 10 mg by mouth daily.    Marland Kitchen LORazepam (ATIVAN) 0.5 MG tablet Place 1 tablet (0.5 mg total) under the tongue every 6 (six) hours as needed (Nausea). 30 tablet 1  . losartan-hydrochlorothiazide (HYZAAR) 100-12.5 MG tablet Take 1 tablet by mouth daily.    Marland Kitchen  rivaroxaban (XARELTO) 20 MG TABS tablet Take 1 tablet (20 mg total) by mouth daily with supper. 30 tablet 2  . diphenoxylate-atropine (LOMOTIL) 2.5-0.025 MG tablet 1 to 2 tablets 4 times daily as needed for diarrhea (Patient not taking: Reported on 01/27/2019) 40 tablet 1  . HYDROcodone-acetaminophen (NORCO) 5-325 MG tablet Take 1 tablet by mouth every 6 (six) hours as needed for severe pain. 30 tablet 0  .  prochlorperazine (COMPAZINE) 10 MG tablet Take 1 tablet (10 mg total) by mouth every 6 (six) hours as needed for nausea or vomiting. (Patient not taking: Reported on 01/27/2019) 50 tablet 3  . traMADol (ULTRAM) 50 MG tablet Take 1 tablet (50 mg total) by mouth every 6 (six) hours as needed. (Patient not taking: Reported on 01/27/2019) 20 tablet 0   No current facility-administered medications for this visit.    Facility-Administered Medications Ordered in Other Visits  Medication Dose Route Frequency Provider Last Rate Last Dose  . sodium chloride 0.9 % 1,000 mL with magnesium sulfate 2 g infusion   Intravenous Once Lyndsy Gilberto L, PA-C 502 mL/hr at 01/27/19 1224      SURGICAL HISTORY:  Past Surgical History:  Procedure Laterality Date  . CESAREAN SECTION  1988  . HYSTEROSCOPY W/D&C  06/14/2011   Procedure: DILATATION AND CURETTAGE /HYSTEROSCOPY;  Surgeon: Frederico Hamman, MD;  Location: [facility];  Service: Gynecology;  Laterality: N/A;  . IR CV LINE INJECTION  06/25/2018  . PORTACATH PLACEMENT Left 06/13/2018   Procedure: INSERTION PORT-A-CATH;  Surgeon: [name], MD;  Location: [facility];  Service: Thoracic;  Laterality: Left;  . SUPRACLAVICAL NODE BIOPSY Right 05/10/2018   Procedure: SUPRACLAVICAL NODE BIOPSY;  Surgeon: [name], MD;  Location: [facility];  Service: Thoracic;  Laterality: Right;  . TUBAL LIGATION      REVIEW OF SYSTEMS:   Review of Systems  Constitutional: Positive for decreased appetite and fatigue. Negative for chills, fatigue, fever and unexpected weight change.  HENT: Negative for mouth sores, nosebleeds, sore throat and trouble swallowing.   Eyes: Negative for eye problems and icterus.  Respiratory: Negative for cough, hemoptysis, shortness of breath and wheezing.   Cardiovascular: Positive for sternal/left anterior rib pain. Negative for leg swelling.  Gastrointestinal: Positive for nausea and vomiting. Negative for abdominal pain,  constipation, and diarrhea,  Genitourinary: Negative for bladder incontinence, difficulty urinating, dysuria, frequency and hematuria.   Musculoskeletal: Positive for back pain in mid thoracic region and left posterior rib cage. Negative for gait problem, neck pain and neck stiffness.  Skin: Negative for itching and rash.  Neurological: Positive for headache. Negative for dizziness, extremity weakness, gait problem, light-headedness and seizures.  Hematological: Negative for adenopathy. Does not bruise/bleed easily.  Psychiatric/Behavioral: Negative for confusion, depression and sleep disturbance. The patient is not nervous/anxious.     PHYSICAL EXAMINATION:  Blood pressure (!) 159/94, pulse 94, temperature 98.7 F (37.1 C), temperature source Oral, resp. rate 18, height 5\' 7"  (1.702 m), weight 274 lb 14.4 oz (124.7 kg), SpO2 100 %.  ECOG PERFORMANCE STATUS: 1 - Symptomatic but completely ambulatory  Physical Exam  Constitutional: Oriented to person, place, and time and chronically ill appearing and in no distress.  HENT:  Head: Normocephalic and atraumatic.  Mouth/Throat: Oropharynx is clear and moist. No oropharyngeal exudate.  Eyes: Conjunctivae are normal. Right eye exhibits no discharge. Left eye exhibits no discharge. No scleral icterus.  Neck: Normal range of motion. Neck supple.  Cardiovascular: Normal rate, regular rhythm, normal heart  sounds and intact distal pulses. Tenderness to palpation over left anterior chest wall  Pulmonary/Chest: Effort normal and breath sounds normal except continued decreased breath sounds on left lower lobe. No respiratory distress. No wheezes. No rales.  Abdominal: Soft. Bowel sounds are normal. Exhibits no distension and no mass. There is no tenderness.  Musculoskeletal: Tenderness to palpitation of thoracic musculature. Normal range of motion. Exhibits no edema.  Lymphadenopathy:  Positive for right supraclavicular lymphadenopathy Neurological:  Alert and oriented to person, place, and time. Exhibits normal muscle tone. Gait normal. Coordination normal.  Skin: Skin is warm and dry. No rash noted. Not diaphoretic. No erythema. No pallor.  Psychiatric: Mood, memory and judgment normal.  Vitals reviewed.  LABORATORY DATA: Lab Results  Component Value Date   WBC 5.3 01/27/2019   HGB 8.8 (L) 01/27/2019   HCT 27.1 (L) 01/27/2019   MCV 87.4 01/27/2019   PLT 108 (L) 01/27/2019      Chemistry      Component Value Date/Time   NA 142 01/27/2019 1040   NA 144 04/08/2018 1145   K 3.8 01/27/2019 1040   CL 104 01/27/2019 1040   CO2 28 01/27/2019 1040   BUN 13 01/27/2019 1040   BUN 10 04/08/2018 1145   CREATININE 1.12 (H) 01/27/2019 1040      Component Value Date/Time   CALCIUM 8.8 (L) 01/27/2019 1040   ALKPHOS 98 01/27/2019 1040   AST 7 (L) 01/27/2019 1040   ALT 6 01/27/2019 1040   BILITOT 1.0 01/27/2019 1040       RADIOGRAPHIC STUDIES:  No results found.   ASSESSMENT/PLAN:  This isa very pleasant 59 year old African-American female with stage IV (T2a,N3, M1C)high-grade neuroendocrine carcinoma with focal areas of small cell lung cancer. She was diagnosed in January 2020.  Shepreviously underwenttreatment with carboplatin for an AUC of 5 on day 1, etoposide 100 mg/m on days 1, 2, and 3 with Tecentriq and Neulasta support. She is status post7cycles. Starting from cycle #5, the patient had been on maintenance tecentriq. This was discontinued due to evidence of disease progression.   She is currently undergoing systemic chemotherapy withcisplatin 30 mg/m andirinotecan65 mg/m on days 1 and 8 every 3 weeks. She is status post 4 cycles. Her dose of cisplatin was decreased to 25 mg/m2 of Cisplatin and 50 mg/m2 of irinotecan starting from cycle #2.  The patient was seen with Dr. Julien Nordmann today. Labs were reviewed which appear stable.   Regarding her palpable anterior/posterior rib/chest pain, the patient is due for  a restaging CT scan. I will arrange for the patient to have a restaging CT scan of her chest, abdomen, and pelvis prior to her next appointment next week on 02/04/2019 to assess for pain associated with her known diagnosis of cancer. Low suspicion for cardiac etiology due to discomfort being non-exertional and that her pain is elicited with palpation of her chest wall/back. Low suspicion for infectious etiology due to the absence of any fever, chills, cough, sore throat, or shortness of breath. Low suspicion for DVT due to no respiratory symptoms, no calf pain/tenderness/swelling, and reassuring vitals. Vitals within normal limits. Blood pressure slightly elevated due to the patient not having time to take her BP medications this morning. She knows to take them upon returning home today.   I have sent Norco to the patient's pharmacy to manage her cancer related pain. She was advised that this contains tylenol and to be cautious not to exceed the daily limit of tylenol.   She  will receive 2 mg of morphine while in the clinic today. Her chart says that she has an intolerance to oxycodone. When asked, the patient states that she has only taken oxycodone once. She denies any hallucinations and states that she was "up and about her house" a lot when she took it in the past. She has no known exposures or intolerances to morphine that she is aware of. She is willing to try morphine today for pain relief. Her daughter will be driving her home today from the clinic.   For her nausea and vomiting, the patient was encouraged to take her compazine at home for nausea and vomiting. She will receive 8 mg of Zofran while in the clinic today. She will also receive 1L of normal saline with 2 g of magnesium sulfate since her magnesium was slightly low today on her blood work.   The patient was strongly encouraged to go to the emergency room if new or worsening symptoms arise.   The patient was advised to call immediately if  she has any concerning symptoms in the interval. The patient voices understanding of current disease status and treatment options and is in agreement with the current care plan. All questions were answered. The patient knows to call the clinic with any problems, questions or concerns. We can certainly see the patient much sooner if necessary  Orders Placed This Encounter  Procedures  . CT Chest W Contrast    Standing Status:   Future    Standing Expiration Date:   01/27/2020    Order Specific Question:   ** REASON FOR EXAM (FREE TEXT)    Answer:   Restaging Lung Cancer. Chest soreness/increasing back pain    Order Specific Question:   If indicated for the ordered procedure, I authorize the administration of contrast media per Radiology protocol    Answer:   Yes    Order Specific Question:   Is patient pregnant?    Answer:   No    Order Specific Question:   Preferred imaging location?    Answer:   [facility]    Order Specific Question:   Radiology Contrast Protocol - do NOT remove file path    Answer:   \\[email or web address]  . CT Abdomen Pelvis W Contrast    Standing Status:   Future    Standing Expiration Date:   01/27/2020    Order Specific Question:   ** REASON FOR EXAM (FREE TEXT)    Answer:   Restaging Lung Cancer    Order Specific Question:   If indicated for the ordered procedure, I authorize the administration of contrast media per Radiology protocol    Answer:   Yes    Order Specific Question:   Is patient pregnant?    Answer:   No    Order Specific Question:   Preferred imaging location?    Answer:   [facility]    Order Specific Question:   Is Oral Contrast requested for this exam?    Answer:   Yes, Per Radiology protocol    Order Specific Question:   Radiology Contrast Protocol - do NOT remove file path    Answer:   \\[email or web address]     Tatum Corl L Nannie Starzyk,  PA-C 01/27/19  ADDENDUM: Hematology/Oncology Attending: I had a face-to-face encounter with the patient today.  I recommended her care plan.  This is a very pleasant 58 years old African-American female with metastatic high-grade neuroendocrine carcinoma status post systemic chemotherapy with carboplatin  and etoposide with initial partial response followed by disease progression. The patient is currently undergoing second line treatment with reduced dose cisplatin and irinotecan status post 3 cycles in addition to day 1 of cycle #4. She was unable to receive day 8 of cycle #4 last week because of thrombocytopenia.  She presented to the clinic today complaining of left sided chest pain with a palpable mass subcutaneously in the left side of the chest.  The patient tried some of the home medication including tramadol with no improvement. She also has few episodes of nausea and vomiting recently.  She denied having any associated neck or left arm pain.  She denied having any shortness of breath or diaphoresis. We will arrange for the patient to have repeat CT scan of the chest, abdomen pelvis in the next few days. We will also start the patient on IV hydration with normal saline as well as Zofran 8 mg IV x1 today. We will give her a dose of morphine sulfate in the clinic today for pain management. We will also change her pain medication to Norco 5/325 mg p.o. every 8 hours as needed for pain. The patient has an appointment scheduled next week for reevaluation and discussion of her scan results and further recommendation regarding her condition.  She was also advised to call immediately or go to the emergency department if she has any worsening of her condition.  Disclaimer: This note was dictated with voice recognition software. Similar sounding words can inadvertently be transcribed and may be missed upon review. Eilleen Kempf, MD 01/27/19

## 2019-01-27 NOTE — Patient Instructions (Signed)

## 2019-01-27 NOTE — Telephone Encounter (Signed)
New -  chest pain and mid back pain -describes it as "pressure in front " .vomited x 1 last night. Tylenol hs and 3 am without relief. Per Julien Nordmann pt to see Cassie this am. Schedule message sent.

## 2019-01-27 NOTE — Telephone Encounter (Signed)
Equipiment requested.Toilet seat and walker. She said she had talked to [facility] about it.

## 2019-01-27 NOTE — Progress Notes (Signed)
Patient is willing to try morphine (with oxycodone intolerance).  Hardie Pulley, PharmD, BCPS, BCOP

## 2019-01-28 ENCOUNTER — Ambulatory Visit (HOSPITAL_COMMUNITY)
Admission: RE | Admit: 2019-01-28 | Discharge: 2019-01-28 | Disposition: A | Discharge status: Home / Self Care | Payer: Medicaid Other | Source: Ambulatory Visit | Attending: Physician Assistant | Admitting: Physician Assistant

## 2019-01-28 ENCOUNTER — Telehealth: Payer: Self-pay | Admitting: Physician Assistant

## 2019-01-28 ENCOUNTER — Ambulatory Visit (HOSPITAL_COMMUNITY): Admission: RE | Admit: 2019-01-28 | Payer: Medicaid Other | Source: Ambulatory Visit

## 2019-01-28 ENCOUNTER — Other Ambulatory Visit: Payer: Self-pay | Admitting: Physician Assistant

## 2019-01-28 ENCOUNTER — Other Ambulatory Visit: Payer: Self-pay | Admitting: *Deleted

## 2019-01-28 DIAGNOSIS — C3492 Malignant neoplasm of unspecified part of left bronchus or lung: Secondary | ICD-10-CM

## 2019-01-28 DIAGNOSIS — C3412 Malignant neoplasm of upper lobe, left bronchus or lung: Secondary | ICD-10-CM | POA: Diagnosis present

## 2019-01-28 MED ORDER — HEPARIN SOD (PORK) LOCK FLUSH 100 UNIT/ML IV SOLN
INTRAVENOUS | Status: AC
  Filled 2019-01-28: qty 5

## 2019-01-28 MED ORDER — IOHEXOL 300 MG/ML  SOLN
100.0000 mL | Freq: Once | INTRAMUSCULAR | Status: AC | PRN
  Administered 2019-01-28: 11:00:00 100 mL via INTRAVENOUS

## 2019-01-28 MED ORDER — HEPARIN SOD (PORK) LOCK FLUSH 100 UNIT/ML IV SOLN
500.0000 [IU] | Freq: Once | INTRAVENOUS | Status: AC
  Administered 2019-01-28: 500 [IU] via INTRAVENOUS

## 2019-01-28 NOTE — Progress Notes (Signed)
Orders for DME called to [facility].  Spoke with [place] [facility].

## 2019-01-28 NOTE — Telephone Encounter (Signed)
Spoke to the patient's daughter regarding her CT scan that she had performed today. I have placed a referral to radiation oncology for consideration of palliative radiotherapy to the extrathoracic soft tissue nodule, on the left second sternocostal junction. Also, the patient was advised to continue to take her Xarelto for the history of the pulmonary embolism. The patient was advised to keep her appointment for next week. All questions answered.

## 2019-02-04 ENCOUNTER — Encounter: Payer: Self-pay | Admitting: Physician Assistant

## 2019-02-04 ENCOUNTER — Other Ambulatory Visit: Payer: Self-pay

## 2019-02-04 ENCOUNTER — Inpatient Hospital Stay (HOSPITAL_BASED_OUTPATIENT_CLINIC_OR_DEPARTMENT_OTHER): Payer: Medicaid Other | Admitting: Physician Assistant

## 2019-02-04 ENCOUNTER — Inpatient Hospital Stay: Payer: Medicaid Other

## 2019-02-04 ENCOUNTER — Other Ambulatory Visit: Payer: Self-pay | Admitting: Physician Assistant

## 2019-02-04 VITALS — BP 114/90 | HR 97 | Temp 98.5°F | Resp 16

## 2019-02-04 VITALS — BP 141/96 | HR 117 | Temp 98.5°F | Resp 18 | Ht 67.0 in | Wt 272.1 lb

## 2019-02-04 DIAGNOSIS — Z5111 Encounter for antineoplastic chemotherapy: Secondary | ICD-10-CM

## 2019-02-04 DIAGNOSIS — C3412 Malignant neoplasm of upper lobe, left bronchus or lung: Secondary | ICD-10-CM

## 2019-02-04 DIAGNOSIS — I10 Essential (primary) hypertension: Secondary | ICD-10-CM

## 2019-02-04 DIAGNOSIS — D6481 Anemia due to antineoplastic chemotherapy: Secondary | ICD-10-CM

## 2019-02-04 DIAGNOSIS — T451X5A Adverse effect of antineoplastic and immunosuppressive drugs, initial encounter: Secondary | ICD-10-CM

## 2019-02-04 DIAGNOSIS — Z95828 Presence of other vascular implants and grafts: Secondary | ICD-10-CM

## 2019-02-04 DIAGNOSIS — C3492 Malignant neoplasm of unspecified part of left bronchus or lung: Secondary | ICD-10-CM

## 2019-02-04 LAB — CMP (CANCER CENTER ONLY)
ALT: 17 U/L (ref 0–44)
AST: 11 U/L — ABNORMAL LOW (ref 15–41)
Albumin: 3.1 g/dL — ABNORMAL LOW (ref 3.5–5.0)
Alkaline Phosphatase: 91 U/L (ref 38–126)
Anion gap: 13 (ref 5–15)
BUN: 13 mg/dL (ref 6–20)
CO2: 23 mmol/L (ref 22–32)
Calcium: 8.7 mg/dL — ABNORMAL LOW (ref 8.9–10.3)
Chloride: 106 mmol/L (ref 98–111)
Creatinine: 1.31 mg/dL — ABNORMAL HIGH (ref 0.44–1.00)
GFR, Est AFR Am: 52 mL/min — ABNORMAL LOW (ref >60)
GFR, Estimated: 45 mL/min — ABNORMAL LOW (ref >60)
Glucose, Bld: 120 mg/dL — ABNORMAL HIGH (ref 70–99)
Potassium: 3.4 mmol/L — ABNORMAL LOW (ref 3.5–5.1)
Sodium: 142 mmol/L (ref 135–145)
Total Bilirubin: 1 mg/dL (ref 0.3–1.2)
Total Protein: 7.8 g/dL (ref 6.5–8.1)

## 2019-02-04 LAB — CBC WITH DIFFERENTIAL (CANCER CENTER ONLY)
Abs Immature Granulocytes: 0.02 10*3/uL (ref 0.00–0.07)
Basophils Absolute: 0 10*3/uL (ref 0.0–0.1)
Basophils Relative: 0 %
Eosinophils Absolute: 0 10*3/uL (ref 0.0–0.5)
Eosinophils Relative: 0 %
HCT: 25.1 % — ABNORMAL LOW (ref 36.0–46.0)
Hemoglobin: 8 g/dL — ABNORMAL LOW (ref 12.0–15.0)
Immature Granulocytes: 1 %
Lymphocytes Relative: 31 %
Lymphs Abs: 1 10*3/uL (ref 0.7–4.0)
MCH: 28 pg (ref 26.0–34.0)
MCHC: 31.9 g/dL (ref 30.0–36.0)
MCV: 87.8 fL (ref 80.0–100.0)
Monocytes Absolute: 0.3 10*3/uL (ref 0.1–1.0)
Monocytes Relative: 9 %
Neutro Abs: 1.9 10*3/uL (ref 1.7–7.7)
Neutrophils Relative %: 59 %
Platelet Count: 101 10*3/uL — ABNORMAL LOW (ref 150–400)
RBC: 2.86 MIL/uL — ABNORMAL LOW (ref 3.87–5.11)
RDW: 18.4 % — ABNORMAL HIGH (ref 11.5–15.5)
WBC Count: 3.2 10*3/uL — ABNORMAL LOW (ref 4.0–10.5)
nRBC: 0 % (ref 0.0–0.2)

## 2019-02-04 LAB — MAGNESIUM: Magnesium: 1.8 mg/dL (ref 1.7–2.4)

## 2019-02-04 MED ORDER — SODIUM CHLORIDE 0.9% FLUSH
10.0000 mL | INTRAVENOUS | Status: DC | PRN
  Administered 2019-02-04: 10 mL
  Filled 2019-02-04: qty 10

## 2019-02-04 MED ORDER — SODIUM CHLORIDE 0.9 % IV SOLN
Freq: Once | INTRAVENOUS | Status: AC
  Administered 2019-02-04: 10:00:00 via INTRAVENOUS
  Filled 2019-02-04: qty 250

## 2019-02-04 MED ORDER — POTASSIUM CHLORIDE 2 MEQ/ML IV SOLN
Freq: Once | INTRAVENOUS | Status: AC
  Administered 2019-02-04: 10:00:00 via INTRAVENOUS
  Filled 2019-02-04: qty 10

## 2019-02-04 MED ORDER — HEPARIN SOD (PORK) LOCK FLUSH 100 UNIT/ML IV SOLN
500.0000 [IU] | Freq: Once | INTRAVENOUS | Status: AC | PRN
  Administered 2019-02-04: 18:00:00 500 [IU]
  Filled 2019-02-04: qty 5

## 2019-02-04 MED ORDER — SODIUM CHLORIDE 0.9 % IV SOLN
Freq: Once | INTRAVENOUS | Status: AC
  Administered 2019-02-04: 13:00:00 via INTRAVENOUS
  Filled 2019-02-04: qty 5

## 2019-02-04 MED ORDER — PALONOSETRON HCL INJECTION 0.25 MG/5ML
0.2500 mg | Freq: Once | INTRAVENOUS | Status: AC
  Administered 2019-02-04: 0.25 mg via INTRAVENOUS

## 2019-02-04 MED ORDER — PALONOSETRON HCL INJECTION 0.25 MG/5ML
INTRAVENOUS | Status: AC
  Filled 2019-02-04: qty 5

## 2019-02-04 MED ORDER — IRINOTECAN HCL CHEMO INJECTION 100 MG/5ML
50.0000 mg/m2 | Freq: Once | INTRAVENOUS | Status: AC
  Administered 2019-02-04: 120 mg via INTRAVENOUS
  Filled 2019-02-04: qty 6

## 2019-02-04 MED ORDER — SODIUM CHLORIDE 0.9 % IV SOLN: 4.0000 g | Freq: Once | INTRAVENOUS | Status: DC

## 2019-02-04 MED ORDER — SODIUM CHLORIDE 0.9 % IV SOLN
25.0000 mg/m2 | Freq: Once | INTRAVENOUS | Status: AC
  Administered 2019-02-04: 16:00:00 62 mg via INTRAVENOUS
  Filled 2019-02-04: qty 62

## 2019-02-04 MED ORDER — ATROPINE SULFATE 1 MG/ML IJ SOLN
0.5000 mg | Freq: Once | INTRAMUSCULAR | Status: AC | PRN
  Administered 2019-02-04: 0.5 mg via INTRAVENOUS

## 2019-02-04 MED ORDER — ATROPINE SULFATE 1 MG/ML IJ SOLN
INTRAMUSCULAR | Status: AC
  Filled 2019-02-04: qty 1

## 2019-02-04 NOTE — Progress Notes (Signed)
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  PRIOR THERAPY:  1) Carboplatin for AUC of 5 on day 1, etoposide 100 mg/M2 on days 1, 2 and 3 with Neulasta support in addition to [place].First dose February 10th, 2020. Status post 4 cycles. 2) Maintenance treatment with Tecentriq 1200 mg IV every 3 weeks. First dose Sep 10, 2018. Status post 3 cycles. This was discontinued secondary to disease progression.   CURRENT THERAPY: Systemic chemotherapy with cisplatin 30 mg/M2 and irinotecan 65 mg/M2 on days 1 and 8 every 3 weeks. First dose October 28, 2018. Status post 4 cycles.  INTERVAL HISTORY: Samantha Santos 58 y.o. female to the clinic for a follow-up visit.  The patient is feeling much better today. The patient was seen last week for nausea, vomiting, and chest soreness.  A CT was performed which showed an enlarging soft tissue nodule at the left sternocostal junction which is where she localized her chest soreness/pain.  She was referred to radiation oncology for consideration of palliative radiotherapy to this region.  She is scheduled to see Dr. Sondra Come from radiation oncology tomorrow for consultation.  She is also taking Norco for pain control. The patient denies any more nausea since last week with the use of her anti-emetic. She denies any fever, chills, night sweats, or weight loss.  She denies any cough or hemoptysis. She reports her baseline shortness of breath with exertion. She denies any more nausea, vomiting, diarrhea, or constipation.  She denies any headache or visual changes.  She recently had a restaging CT scan performed.  She is here today for evaluation and to review her CT results before starting cycle #5 of cisplatin and irinotecan.  MEDICAL HISTORY: Past Medical  History:  Diagnosis Date  . Anemia   . Arthritis   . GERD (gastroesophageal reflux disease)   . Hypertension   . Morbid obesity ([place]) 05/06/2018  . Supraclavicular adenopathy     ALLERGIES:  is allergic to penicillins and oxycodone.  MEDICATIONS:  Current Outpatient Medications  Medication Sig Dispense Refill  . acetaminophen (TYLENOL) 500 MG tablet Take 500 mg by mouth every 6 (six) hours as needed.    . diphenoxylate-atropine (LOMOTIL) 2.5-0.025 MG tablet 1 to 2 tablets 4 times daily as needed for diarrhea (Patient not taking: Reported on 01/27/2019) 40 tablet 1  . gabapentin (NEURONTIN) 100 MG capsule Take 1 capsule (100 mg total) by mouth at bedtime. 30 capsule 2  . HYDROcodone-acetaminophen (NORCO) 5-325 MG tablet Take 1 tablet by mouth every 6 (six) hours as needed for severe pain. 30 tablet 0  . lidocaine-prilocaine (EMLA) cream Apply 1 application topically as needed (chemo port). 30 g 0  . loratadine (CLARITIN) 10 MG tablet Take 10 mg by mouth daily.    Marland Kitchen LORazepam (ATIVAN) 0.5 MG tablet Place 1 tablet (0.5 mg total) under the tongue every 6 (six) hours as needed (Nausea). 30 tablet 1  . losartan-hydrochlorothiazide (HYZAAR) 100-12.5 MG tablet Take 1 tablet by mouth daily.    . prochlorperazine (COMPAZINE) 10 MG tablet Take 1 tablet (10 mg total) by mouth every 6 (six) hours as needed for nausea or vomiting. (Patient not taking: Reported on 01/27/2019) 50 tablet 3  . rivaroxaban (XARELTO) 20 MG TABS tablet Take 1 tablet (20 mg total) by mouth daily with supper.  30 tablet 2  . traMADol (ULTRAM) 50 MG tablet Take 1 tablet (50 mg total) by mouth every 6 (six) hours as needed. (Patient not taking: Reported on 01/27/2019) 20 tablet 0   Current Facility-Administered Medications  Medication Dose Route Frequency Provider Last Rate Last Dose  . sodium chloride flush (NS) 0.9 % injection 10 mL  10 mL Intracatheter PRN Curt Bears, MD   10 mL at 02/04/19 7408   Facility-Administered  Medications Ordered in Other Visits  Medication Dose Route Frequency Provider Last Rate Last Dose  . magnesium sulfate 4 g in sodium chloride 0.9 % 500 mL  4 g Intravenous Once Curt Bears, MD        SURGICAL HISTORY:  Past Surgical History:  Procedure Laterality Date  . CESAREAN SECTION  1988  . HYSTEROSCOPY W/D&C  06/14/2011   Procedure: DILATATION AND CURETTAGE /HYSTEROSCOPY;  Surgeon: Frederico Hamman, MD;  Location: [facility];  Service: Gynecology;  Laterality: N/A;  . IR CV LINE INJECTION  06/25/2018  . PORTACATH PLACEMENT Left 06/13/2018   Procedure: INSERTION PORT-A-CATH;  Surgeon: [name], MD;  Location: [facility];  Service: Thoracic;  Laterality: Left;  . SUPRACLAVICAL NODE BIOPSY Right 05/10/2018   Procedure: SUPRACLAVICAL NODE BIOPSY;  Surgeon: [name], MD;  Location: [facility];  Service: Thoracic;  Laterality: Right;  . TUBAL LIGATION      REVIEW OF SYSTEMS:   Review of Systems  Constitutional: Negative for appetite change, chills, fatigue, fever and unexpected weight change.  HENT: Negative for mouth sores, nosebleeds, sore throat and trouble swallowing.   Eyes: Negative for eye problems and icterus.  Respiratory: Positive for baseline shortness of breath with exertion. Negative for cough, hemoptysis, and wheezing.   Cardiovascular: Positive for chest pain in the left anterior 2nd sternocostal junction. Negative for leg swelling.  Gastrointestinal: Negative for abdominal pain, constipation, diarrhea, nausea and vomiting.  Genitourinary: Negative for bladder incontinence, difficulty urinating, dysuria, frequency and hematuria.   Musculoskeletal: Negative for back pain, gait problem, neck pain and neck stiffness.  Skin: Negative for itching and rash.  Neurological: Negative for dizziness, extremity weakness, gait problem, headaches, light-headedness and seizures.  Hematological: Negative for adenopathy. Does not bruise/bleed easily.   Psychiatric/Behavioral: Negative for confusion, depression and sleep disturbance. The patient is not nervous/anxious.     PHYSICAL EXAMINATION:  Blood pressure (!) 141/96, pulse (!) 117, temperature 98.5 F (36.9 C), temperature source Temporal, resp. rate 18, height 5\' 7"  (1.702 m), weight 272 lb 1.6 oz (123.4 kg), SpO2 100 %.  ECOG PERFORMANCE STATUS: 1 - Symptomatic but completely ambulatory  Physical Exam  Constitutional: Oriented to person, place, and time and well-developed, well-nourished, and in no distress.  HENT:  Head: Normocephalic and atraumatic.  Mouth/Throat: Oropharynx is clear and moist. No oropharyngeal exudate.  Eyes: Conjunctivae are normal. Right eye exhibits no discharge. Left eye exhibits no discharge. No scleral icterus.  Neck: Normal range of motion. Neck supple.  Cardiovascular: Normal rate, regular rhythm, normal heart sounds and intact distal pulses.   Pulmonary/Chest: Effort normal. Decreased breath sounds on lower left lobe. No respiratory distress. No wheezes. No rales.  Abdominal: Soft. Bowel sounds are normal. Exhibits no distension and no mass. There is no tenderness.  Musculoskeletal: Normal range of motion. Exhibits no edema.  Lymphadenopathy:   Positive for right supraclavicular lymphadenopathy.  Neurological: Alert and oriented to person, place, and time. Exhibits normal muscle tone. Gait normal. Coordination normal.  Skin: Skin is warm and dry. No  rash noted. Not diaphoretic. No erythema. No pallor.  Psychiatric: Mood, memory and judgment normal.  Vitals reviewed.  LABORATORY DATA: Lab Results  Component Value Date   WBC 3.2 (L) 02/04/2019   HGB 8.0 (L) 02/04/2019   HCT 25.1 (L) 02/04/2019   MCV 87.8 02/04/2019   PLT 101 (L) 02/04/2019      Chemistry      Component Value Date/Time   NA 142 02/04/2019 0855   NA 144 04/08/2018 1145   K 3.4 (L) 02/04/2019 0855   CL 106 02/04/2019 0855   CO2 23 02/04/2019 0855   BUN 13 02/04/2019 0855    BUN 10 04/08/2018 1145   CREATININE 1.31 (H) 02/04/2019 0855      Component Value Date/Time   CALCIUM 8.7 (L) 02/04/2019 0855   ALKPHOS 91 02/04/2019 0855   AST 11 (L) 02/04/2019 0855   ALT 17 02/04/2019 0855   BILITOT 1.0 02/04/2019 0855       RADIOGRAPHIC STUDIES:  Ct Chest W Contrast  Result Date: 01/28/2019 CLINICAL DATA:  Left lung cancer, chemotherapy ongoing, worsening chest and back pain EXAM: CT CHEST, ABDOMEN, AND PELVIS WITH CONTRAST TECHNIQUE: Multidetector CT imaging of the chest, abdomen and pelvis was performed following the standard protocol during bolus administration of intravenous contrast. CONTRAST:  117mL OMNIPAQUE IOHEXOL 300 MG/ML  SOLN COMPARISON:  12/17/2018, 10/16/2018, 06/24/2018 FINDINGS: CT CHEST FINDINGS Cardiovascular: Left chest port catheter. Normal heart size. Trace pericardial effusion. Mediastinum/Nodes: Redemonstrated bulky soft tissue nodules of the anterior mediastinum and AP window, slightly increased in size compared to prior examination measuring 6.8 x 3.8 cm, previously 6.0 x 3.2 cm (series 2, image 17). No change in subcarinal and hilar lymph nodes. Interval decrease in size of a left axillary lymph node, now measuring 1.4 x 0.7 cm, previously 2.2 x 1.5 cm (series 2, image 12). Thyroid gland, trachea, and esophagus demonstrate no significant findings. Lungs/Pleura: There are new trace bilateral pleural effusions. There is a new, wedge-shaped subpleural ground-glass opacity of the superior segment right lower lobe with some suggestion of central clearing (series 4, image 51). Redemonstrated bandlike scarring of the left lower lobe. Musculoskeletal: Interval increase in size of an extrathoracic soft tissue nodule, abutting the left second sternocostal junction and external to anterior mediastinal soft tissue. This nodule measures 2.4 x 2.0 cm, previously 1.7 x 1.6 cm when measured similarly (series 2, image 16). No change in sclerotic appearance of the  manubrium. CT ABDOMEN PELVIS FINDINGS Hepatobiliary: No solid liver abnormality is seen. No gallstones, gallbladder wall thickening, or biliary dilatation. Pancreas: Unremarkable. No pancreatic ductal dilatation or surrounding inflammatory changes. Spleen: Normal in size without significant abnormality. Adrenals/Urinary Tract: Adrenal glands are unremarkable. Kidneys are normal, without renal calculi, solid lesion, or hydronephrosis. Bladder is unremarkable. Stomach/Bowel: Stomach is within normal limits. Appendix appears normal. No evidence of bowel wall thickening, distention, or inflammatory changes. Vascular/Lymphatic: No significant vascular findings are present. No enlarged abdominal or pelvic lymph nodes. Reproductive: No mass or other abnormality. Other: No abdominal wall hernia or abnormality. No abdominopelvic ascites. Musculoskeletal: No acute or significant osseous findings. IMPRESSION: 1. There is a new, wedge-shaped subpleural ground-glass opacity of the superior segment right lower lobe with some suggestion of central clearing (series 4, image 51). This appearance is concerning for pulmonary embolus and pulmonary infarct, particularly given acute clinical presentation of chest pain. There is no direct evidence of pulmonary embolism on this examination not tailored for assessment of the pulmonary arteries. Consider CT angiogram to further  evaluate. 2. Redemonstrated bulky soft tissue nodules of the anterior mediastinum and AP window, slightly increased in size compared to prior examination measuring 6.8 x 3.8 cm, previously 6.0 x 3.2 cm (series 2, image 17). 3. Interval increase in size of an extrathoracic soft tissue nodule, abutting the left second sternocostal junction and external to anterior mediastinal soft tissue. This nodule measures 2.4 x 2.0 cm, previously 1.7 x 1.6 cm when measured similarly (series 2, image 16). This may correspond to reported palpable abnormality and source of pain. No  change in sclerotic appearance of the manubrium. 4. Findings above are consistent with worsening metastatic disease. 5. Decreased size of left axillary lymph node, of uncertain significance, possibly reflecting mixed response to treatment. 6. No evidence of metastatic disease in the abdomen or pelvis. 7. New trace bilateral pleural effusions. These results will be called to the ordering clinician or representative by the Radiologist Assistant, and communication documented in the PACS or zVision Dashboard. Electronically Signed   By: Eddie Candle M.D.   On: 01/28/2019 12:24   Ct Abdomen Pelvis W Contrast  Result Date: 01/28/2019 CLINICAL DATA:  Left lung cancer, chemotherapy ongoing, worsening chest and back pain EXAM: CT CHEST, ABDOMEN, AND PELVIS WITH CONTRAST TECHNIQUE: Multidetector CT imaging of the chest, abdomen and pelvis was performed following the standard protocol during bolus administration of intravenous contrast. CONTRAST:  181mL OMNIPAQUE IOHEXOL 300 MG/ML  SOLN COMPARISON:  12/17/2018, 10/16/2018, 06/24/2018 FINDINGS: CT CHEST FINDINGS Cardiovascular: Left chest port catheter. Normal heart size. Trace pericardial effusion. Mediastinum/Nodes: Redemonstrated bulky soft tissue nodules of the anterior mediastinum and AP window, slightly increased in size compared to prior examination measuring 6.8 x 3.8 cm, previously 6.0 x 3.2 cm (series 2, image 17). No change in subcarinal and hilar lymph nodes. Interval decrease in size of a left axillary lymph node, now measuring 1.4 x 0.7 cm, previously 2.2 x 1.5 cm (series 2, image 12). Thyroid gland, trachea, and esophagus demonstrate no significant findings. Lungs/Pleura: There are new trace bilateral pleural effusions. There is a new, wedge-shaped subpleural ground-glass opacity of the superior segment right lower lobe with some suggestion of central clearing (series 4, image 51). Redemonstrated bandlike scarring of the left lower lobe. Musculoskeletal:  Interval increase in size of an extrathoracic soft tissue nodule, abutting the left second sternocostal junction and external to anterior mediastinal soft tissue. This nodule measures 2.4 x 2.0 cm, previously 1.7 x 1.6 cm when measured similarly (series 2, image 16). No change in sclerotic appearance of the manubrium. CT ABDOMEN PELVIS FINDINGS Hepatobiliary: No solid liver abnormality is seen. No gallstones, gallbladder wall thickening, or biliary dilatation. Pancreas: Unremarkable. No pancreatic ductal dilatation or surrounding inflammatory changes. Spleen: Normal in size without significant abnormality. Adrenals/Urinary Tract: Adrenal glands are unremarkable. Kidneys are normal, without renal calculi, solid lesion, or hydronephrosis. Bladder is unremarkable. Stomach/Bowel: Stomach is within normal limits. Appendix appears normal. No evidence of bowel wall thickening, distention, or inflammatory changes. Vascular/Lymphatic: No significant vascular findings are present. No enlarged abdominal or pelvic lymph nodes. Reproductive: No mass or other abnormality. Other: No abdominal wall hernia or abnormality. No abdominopelvic ascites. Musculoskeletal: No acute or significant osseous findings. IMPRESSION: 1. There is a new, wedge-shaped subpleural ground-glass opacity of the superior segment right lower lobe with some suggestion of central clearing (series 4, image 51). This appearance is concerning for pulmonary embolus and pulmonary infarct, particularly given acute clinical presentation of chest pain. There is no direct evidence of pulmonary embolism on this examination  not tailored for assessment of the pulmonary arteries. Consider CT angiogram to further evaluate. 2. Redemonstrated bulky soft tissue nodules of the anterior mediastinum and AP window, slightly increased in size compared to prior examination measuring 6.8 x 3.8 cm, previously 6.0 x 3.2 cm (series 2, image 17). 3. Interval increase in size of an  extrathoracic soft tissue nodule, abutting the left second sternocostal junction and external to anterior mediastinal soft tissue. This nodule measures 2.4 x 2.0 cm, previously 1.7 x 1.6 cm when measured similarly (series 2, image 16). This may correspond to reported palpable abnormality and source of pain. No change in sclerotic appearance of the manubrium. 4. Findings above are consistent with worsening metastatic disease. 5. Decreased size of left axillary lymph node, of uncertain significance, possibly reflecting mixed response to treatment. 6. No evidence of metastatic disease in the abdomen or pelvis. 7. New trace bilateral pleural effusions. These results will be called to the ordering clinician or representative by the Radiologist Assistant, and communication documented in the PACS or zVision Dashboard. Electronically Signed   By: Eddie Candle M.D.   On: 01/28/2019 12:24     ASSESSMENT/PLAN:  This isa very pleasant 58 year old African-American female with stage IV (T2a,N3, M1C)high-grade neuroendocrine carcinoma with focal areas of small cell lung cancer. She was diagnosed in January 2020.  Shepreviously underwenttreatment with carboplatin for an AUC of 5 on day 1, etoposide 100 mg/m on days 1, 2, and 3 with Tecentriq and Neulasta support. She is status post7cycles. Starting from cycle #5, the patient had been on maintenance tecentriq. This was discontinued due to evidence of disease progression.   She is currently undergoing systemic chemotherapy withcisplatin 30 mg/m andirinotecan65 mg/m on days 1 and 8 every 3 weeks.She is status post 4 cycles. Her dose of cisplatin was decreased to 25 mg/m2 of Cisplatin and 50 mg/m2 of irinotecan starting from cycle #2.  The patient recently had a restaging CT scan.  Dr. Julien Nordmann personally and independently reviewed the scan and discussed results with the patient today. The scan showed slightly worsening bulky soft tissue nodules of the  anterior mediastinum as well as the increased extrathoracic soft tissue nodule. No other evidence of metastatic disease in the abdomen or pelvis. Dr. Julien Nordmann recommends that we continue on the same treatment with cisplatin and irinotecan and that the patient continue with her consultation with radiation oncology tomorrow to consider radiation to the enlarging nodules in the mediastinum as well as the left second sternocostal junction.   The patient will come back for follow-up visit in 3 weeks for evaluation before starting cycle #6.   The patient will meet with radiation oncology tomorrow as scheduled.   The patient will continue using her Norco for pain management. She will continue using her compazine for nausea.   The patient's hemoglobin is 8.0 today. We will arrange for her to have a blood transfusion later this week.   The patient was advised to call immediately if she has any concerning symptoms in the interval. The patient voices understanding of current disease status and treatment options and is in agreement with the current care plan. All questions were answered. The patient knows to call the clinic with any problems, questions or concerns. We can certainly see the patient much sooner if necessary  Orders Placed This Encounter  Procedures  . Type and screen    Standing Status:   Future    Number of Occurrences:   1    Standing Expiration Date:  02/04/2020     Cassandra L Heilingoetter, PA-C 02/04/19  ADDENDUM: Hematology/Oncology Attending: I had a face-to-face encounter with the patient today.  I recommended her care plan.  This is a very pleasant 58 years old African-American female with metastatic high-grade neuroendocrine carcinoma.  The patient is status post induction treatment with carboplatin and etoposide with initial improvement followed by disease progression.  She is currently undergoing treatment with second line reduced dose cisplatin and irinotecan status post 4  cycles.  She has been tolerating the treatment well.  She reported pain on the left side of the chest recently and CT scan of the chest showed increase in soft tissue nodule abutting the left second sternocostal junction and extended to the anterior mediastinal soft tissue.  There was also increase in some of the mediastinal lymph nodes. I recommended for the patient to see Dr. Dorathy Daft for consideration of palliative radiotherapy to this area. We will continue her current treatment with reduced dose cisplatin and irinotecan since there is no other areas of disease progression. For the chemotherapy-induced anemia, I will arrange for the patient to receive 2 units of PRBCs transfusion this week. The patient will come back for follow-up visit in 3 weeks for evaluation before the next cycle of her treatment. She was advised to call immediately if she has any concerning symptoms in the interval.  Disclaimer: This note was dictated with voice recognition software. Similar sounding words can inadvertently be transcribed and may be missed upon review. Eilleen Kempf, MD 02/04/19

## 2019-02-04 NOTE — Patient Instructions (Signed)
[facility] Discharge Instructions for Patients Receiving Chemotherapy  Today you received the following chemotherapy agents :  Irinotecan,  Cisplatin.  To help prevent nausea and vomiting after your treatment, we encourage you to take your nausea medication as prescribed.   If you develop nausea and vomiting that is not controlled by your nausea medication, call the clinic.   BELOW ARE SYMPTOMS THAT SHOULD BE REPORTED IMMEDIATELY:  *FEVER GREATER THAN 100.5 F  *CHILLS WITH OR WITHOUT FEVER  NAUSEA AND VOMITING THAT IS NOT CONTROLLED WITH YOUR NAUSEA MEDICATION  *UNUSUAL SHORTNESS OF BREATH  *UNUSUAL BRUISING OR BLEEDING  TENDERNESS IN MOUTH AND THROAT WITH OR WITHOUT PRESENCE OF ULCERS  *URINARY PROBLEMS  *BOWEL PROBLEMS  UNUSUAL RASH Items with * indicate a potential emergency and should be followed up as soon as possible.  Feel free to call the clinic should you have any questions or concerns. The clinic phone number is [phone].  Please show the [facility] at check-in to the Emergency Department and triage nurse.

## 2019-02-05 ENCOUNTER — Other Ambulatory Visit: Payer: Self-pay

## 2019-02-05 ENCOUNTER — Ambulatory Visit
Admission: RE | Admit: 2019-02-05 | Discharge: 2019-02-05 | Disposition: A | Discharge status: Home / Self Care | Payer: Medicaid Other | Source: Ambulatory Visit | Attending: Radiation Oncology | Admitting: Radiation Oncology

## 2019-02-05 ENCOUNTER — Encounter: Payer: Self-pay | Admitting: Radiation Oncology

## 2019-02-05 ENCOUNTER — Telehealth: Payer: Self-pay | Admitting: Physician Assistant

## 2019-02-05 VITALS — BP 142/78 | HR 84 | Temp 98.0°F | Resp 17 | Ht 67.0 in | Wt 276.0 lb

## 2019-02-05 DIAGNOSIS — K219 Gastro-esophageal reflux disease without esophagitis: Secondary | ICD-10-CM | POA: Insufficient documentation

## 2019-02-05 DIAGNOSIS — Z79899 Other long term (current) drug therapy: Secondary | ICD-10-CM | POA: Diagnosis not present

## 2019-02-05 DIAGNOSIS — E669 Obesity, unspecified: Secondary | ICD-10-CM | POA: Insufficient documentation

## 2019-02-05 DIAGNOSIS — I10 Essential (primary) hypertension: Secondary | ICD-10-CM | POA: Diagnosis not present

## 2019-02-05 DIAGNOSIS — J90 Pleural effusion, not elsewhere classified: Secondary | ICD-10-CM | POA: Insufficient documentation

## 2019-02-05 DIAGNOSIS — C3412 Malignant neoplasm of upper lobe, left bronchus or lung: Secondary | ICD-10-CM | POA: Insufficient documentation

## 2019-02-05 DIAGNOSIS — D649 Anemia, unspecified: Secondary | ICD-10-CM | POA: Diagnosis not present

## 2019-02-05 DIAGNOSIS — C3492 Malignant neoplasm of unspecified part of left bronchus or lung: Secondary | ICD-10-CM

## 2019-02-05 NOTE — Progress Notes (Signed)
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  She was referred to radiation oncology for consideration of palliative radiotherapy to this region.   Tobacco/Marijuana/Snuff/ETOH use: Tobacco Use  . Smoking status: Never Smoker  . Smokeless tobacco: Never Used  Substance Use Topics  . Alcohol use: No  . Drug use: No       Past/Anticipated interventions by medical oncology, if any: PRIOR THERAPY:  1) Carboplatin for AUC of 5 on day 1, etoposide 100 mg/M2 on days 1, 2 and 3 with Neulasta support in addition to [place].First dose February 10th, 2020. Status post 4 cycles. 2) Maintenance treatment with Tecentriq 1200 mg IV every 3 weeks. First dose Sep 10, 2018. Status post 3 cycles. This was discontinued secondary to disease progression.   CURRENT THERAPY: Systemic chemotherapy with cisplatin 30 mg/M2 and irinotecan 65 mg/M2 on days 1 and 8 every 3 weeks. First dose October 28, 2018. Status post4cycles.  The patient recently had a restaging CT scan.  Dr. Julien Nordmann personally and independently reviewed the scan and discussed results with the patient today. The scan showed slightly worsening bulky soft tissue nodules of the anterior mediastinum as well as the increased extrathoracic soft tissue nodule. No other evidence of metastatic disease in the abdomen or pelvis. Dr. Julien Nordmann recommends that we continue on the same treatment with cisplatin and irinotecan and that the patient continue with her consultation with radiation oncology tomorrow to consider radiation to the enlarging nodules in the mediastinum as well as the left second sternocostal junction.   The patient will come back for follow-up visit in 3  weeks for evaluation before starting cycle #6.   The patient will meet with radiation oncology tomorrow as scheduled.   The patient will continue using her Norco for pain management. She will continue using her compazine for nausea.   The patient's hemoglobin is 8.0 today. We will arrange for her to have a blood transfusion later this week.    Signs/Symptoms  Weight changes, if any: Pt reports a 20 pound weight loss over the past several months. The patient denies any more nausea since last week with the use of her anti-emetic. She denies any fever, chills, night sweats, or weight loss.  She denies any cough or hemoptysis. She reports her baseline shortness of breath with exertion. She denies any more nausea, vomiting, diarrhea, or constipation.  She denies any headache or visual changes.   Pain issues, if any: Pt denies c/o pain "now that I have that pain medicine"  SAFETY ISSUES:  Prior radiation? No  Pacemaker/ICD? No  Possible current pregnancy?No  Is the patient on methotrexate? No  Current Complaints / other details:  Pt presents today for initial consult with Dr. Sondra Come for Radiation Oncology. Pt is accompanied by daughter.   BP (!) 142/78 (Patient Position: Sitting)   Pulse 84   Temp 98 F (36.7 C) (Temporal)   Resp 17   Ht 5\' 7"  (1.702 m)   Wt 276 lb (125.2 kg)   SpO2 100%   BMI 43.23 kg/m   Loma Sousa, RN BSN

## 2019-02-05 NOTE — Telephone Encounter (Signed)
Per 10/20 los some of the appts were already scheduled.  Added the appts that needed to be scheduled.  Pt will get a printout at her next appt.

## 2019-02-05 NOTE — Progress Notes (Signed)
Radiation Oncology         [phone]) [phone] ________________________________  Initial Outpatient Consultation  Name: Samantha Santos MRN: [id number]  Date: 02/05/2019  DOB: 1960-07-16  JM:EQAS, Cammie, MD  Heilingoetter, Cassandr*   REFERRING PHYSICIAN: Heilingoetter, Cassandr*  DIAGNOSIS: The encounter diagnosis was Small cell carcinoma of upper lobe of left lung ([place]).  Stage IV (T2a, N3, M1c) high-grade neuroendocrine carcinoma with focal areas of small cell carcinoma  HISTORY OF PRESENT ILLNESS::Samantha Santos is a 58 y.o. female who is accompanied by her daughter. The patient presented to the ED on 04/06/2018 with hypertension, chest pain, and headache. She proceeded to chest x-ray that day, which was abnormal and prompted CT. Chest CT the same day revealed: bulky prevascular mediastinal adenopathy and supraclavicular adenopathy extending into the LUL; no evidence of adenopathy in upper abdomen.  She proceeded to PET scan on 05/02/2018, which showed: highly hypermetabolic adenopathy in the neck, chest, and upper abdomen; bulk of the tumor is in the chest; lytic bony findings and hypermetabolic activity in the sternum compatible with tumor involvement; foci of hypermetabolic activity along the pericardium along with pericardial effusion; accentuated activity along the posterior glottis and tonsils.  The patient was seen in consultation by Dr. Roxan Hockey on 05/06/2018, who recommended outpatient supraclavicular lymph node biopsy. Performed on 05/10/2018, pathology from the procedure revealed: poorly differentiated carcinoma, suggestive of a high grade neuroendocrine carcinoma with focal areas suggestive of small cell carcinoma.  She was referred to Dr. Julien Nordmann on 05/20/2018, who recommended brain MRI and chemotherapy. She began chemotherapy consisting of carboplatin and etoposide on 05/27/2018. Brain MRI performed on 06/03/2018 was negative for metastatic disease. She completed 4 cycles of  carboplatin and etoposide and continued with maintenance tecentriq on 09/10/2018. She received 3 cycles and was found to have disease progression.   She was then placed on more chemotherapy with cisplatin and irinotecan, starting 10/28/2018. Unfortunately, she began experiencing pain to the left side of her chest in 12/2018. Restaging chest/abdomen/pelvis CT performed on 01/28/2019 showed: increase in soft tissue nodule abutting the left second sternocostal junction and extended to the anterior mediastinal soft tissue; increase in some of the mediastinal lymph nodes.  She his seen today out of courtesy of Dr. Julien Nordmann for consideration of palliative radiation therapy to these progressive areas.  PREVIOUS RADIATION THERAPY: No  PAST MEDICAL HISTORY:  Past Medical History:  Diagnosis Date   Anemia    Arthritis    GERD (gastroesophageal reflux disease)    Hypertension    Morbid obesity ([place]) 05/06/2018   Supraclavicular adenopathy     PAST SURGICAL HISTORY: Past Surgical History:  Procedure Laterality Date   CESAREAN SECTION  1988   HYSTEROSCOPY W/D&C  06/14/2011   Procedure: DILATATION AND CURETTAGE /HYSTEROSCOPY;  Surgeon: Frederico Hamman, MD;  Location: [facility];  Service: Gynecology;  Laterality: N/A;   IR CV LINE INJECTION  06/25/2018   PORTACATH PLACEMENT Left 06/13/2018   Procedure: INSERTION PORT-A-CATH;  Surgeon: [name], MD;  Location: [facility];  Service: Thoracic;  Laterality: Left;   SUPRACLAVICAL NODE BIOPSY Right 05/10/2018   Procedure: SUPRACLAVICAL NODE BIOPSY;  Surgeon: [name], MD;  Location: [facility] OR;  Service: Thoracic;  Laterality: Right;   TUBAL LIGATION      FAMILY HISTORY:  Family History  Problem Relation Age of Onset   Diabetes Mother    Hypertension Mother    CAD Mother    CVA Mother     SOCIAL HISTORY:  Social History  Tobacco Use   Smoking status: Never Smoker   Smokeless tobacco: Never Used  Substance Use  Topics   Alcohol use: No   Drug use: No    ALLERGIES:  Allergies  Allergen Reactions   Penicillins Other (See Comments)    Syncope Did it involve swelling of the face/tongue/throat, SOB, or low BP? No Did it involve sudden or severe rash/hives, skin peeling, or any reaction on the inside of your mouth or nose? No Did you need to seek medical attention at a hospital or doctor's office? Yes When did it last happen?over 20 years ago If all above answers are "NO", may proceed with cephalosporin use.    Oxycodone Other (See Comments)    hallucinations    MEDICATIONS:  Current Outpatient Medications  Medication Sig Dispense Refill   acetaminophen (TYLENOL) 500 MG tablet Take 500 mg by mouth every 6 (six) hours as needed.     gabapentin (NEURONTIN) 100 MG capsule Take 1 capsule (100 mg total) by mouth at bedtime. 30 capsule 2   HYDROcodone-acetaminophen (NORCO) 5-325 MG tablet Take 1 tablet by mouth every 6 (six) hours as needed for severe pain. 30 tablet 0   lidocaine-prilocaine (EMLA) cream Apply 1 application topically as needed (chemo port). 30 g 0   loratadine (CLARITIN) 10 MG tablet Take 10 mg by mouth daily.     LORazepam (ATIVAN) 0.5 MG tablet Place 1 tablet (0.5 mg total) under the tongue every 6 (six) hours as needed (Nausea). 30 tablet 1   losartan-hydrochlorothiazide (HYZAAR) 100-12.5 MG tablet Take 1 tablet by mouth daily.     rivaroxaban (XARELTO) 20 MG TABS tablet Take 1 tablet (20 mg total) by mouth daily with supper. 30 tablet 2   diphenoxylate-atropine (LOMOTIL) 2.5-0.025 MG tablet 1 to 2 tablets 4 times daily as needed for diarrhea (Patient not taking: Reported on 01/27/2019) 40 tablet 1   prochlorperazine (COMPAZINE) 10 MG tablet Take 1 tablet (10 mg total) by mouth every 6 (six) hours as needed for nausea or vomiting. (Patient not taking: Reported on 01/27/2019) 50 tablet 3   traMADol (ULTRAM) 50 MG tablet Take 1 tablet (50 mg total) by mouth every  6 (six) hours as needed. (Patient not taking: Reported on 01/27/2019) 20 tablet 0   No current facility-administered medications for this encounter.     REVIEW OF SYSTEMS:  A 10+ POINT REVIEW OF SYSTEMS WAS OBTAINED including neurology, dermatology, psychiatry, cardiac, respiratory, lymph, extremities, GI, GU, musculoskeletal, constitutional, reproductive, HEENT. She reports a 20 lb weight loss over the past several months. She also reports improvement to nausea and pain with recent medication. She denies fever, chills, night sweats, cough, hemoptysis, diarrhea or constipation, headache, and visual changes.   PHYSICAL EXAM:  height is 5\' 7"  (1.702 m) and weight is 276 lb (125.2 kg). Her temporal temperature is 98 F (36.7 C). Her blood pressure is 142/78 (abnormal) and her pulse is 84. Her respiration is 17 and oxygen saturation is 100%.   General: Alert and oriented, in no acute distress HEENT: Head is normocephalic. Extraocular movements are intact. Oropharynx is clear. Neck: Neck is supple, no palpable cervical or supraclavicular lymphadenopathy. Heart: Regular in rate and rhythm with no murmurs, rubs, or gallops. Chest: Clear to auscultation bilaterally, with no rhonchi, wheezes, or rales. Abdomen: Soft, nontender, nondistended, with no rigidity or guarding. Extremities: No cyanosis or edema. Lymphatics: see Neck Exam Skin: No concerning lesions. Musculoskeletal: symmetric strength and muscle tone throughout. Neurologic: Cranial nerves II through XII  are grossly intact. No obvious focalities. Speech is fluent. Coordination is intact. Psychiatric: Judgment and insight are intact. Affect is appropriate.   ECOG = 2  0 - Asymptomatic (Fully active, able to carry on all predisease activities without restriction)  1 - Symptomatic but completely ambulatory (Restricted in physically strenuous activity but ambulatory and able to carry out work of a light or sedentary nature. For example, light  housework, office work)  2 - Symptomatic, <50% in bed during the day (Ambulatory and capable of all self care but unable to carry out any work activities. Up and about more than 50% of waking hours)  3 - Symptomatic, >50% in bed, but not bedbound (Capable of only limited self-care, confined to bed or chair 50% or more of waking hours)  4 - Bedbound (Completely disabled. Cannot carry on any self-care. Totally confined to bed or chair)  5 - Death   Eustace Pen MM, Creech RH, Tormey DC, et al. [phone]). "Toxicity and response criteria of the [facility] Group". [place] Oncol. 5 (6): 649-55  LABORATORY DATA:  Lab Results  Component Value Date   WBC 3.2 (L) 02/04/2019   HGB 8.0 (L) 02/04/2019   HCT 25.1 (L) 02/04/2019   MCV 87.8 02/04/2019   PLT 101 (L) 02/04/2019   NEUTROABS 1.9 02/04/2019   Lab Results  Component Value Date   NA 142 02/04/2019   K 3.4 (L) 02/04/2019   CL 106 02/04/2019   CO2 23 02/04/2019   GLUCOSE 120 (H) 02/04/2019   CREATININE 1.31 (H) 02/04/2019   CALCIUM 8.7 (L) 02/04/2019      RADIOGRAPHY: Ct Chest W Contrast  Result Date: 01/28/2019 CLINICAL DATA:  Left lung cancer, chemotherapy ongoing, worsening chest and back pain EXAM: CT CHEST, ABDOMEN, AND PELVIS WITH CONTRAST TECHNIQUE: Multidetector CT imaging of the chest, abdomen and pelvis was performed following the standard protocol during bolus administration of intravenous contrast. CONTRAST:  138mL OMNIPAQUE IOHEXOL 300 MG/ML  SOLN COMPARISON:  12/17/2018, 10/16/2018, 06/24/2018 FINDINGS: CT CHEST FINDINGS Cardiovascular: Left chest port catheter. Normal heart size. Trace pericardial effusion. Mediastinum/Nodes: Redemonstrated bulky soft tissue nodules of the anterior mediastinum and AP window, slightly increased in size compared to prior examination measuring 6.8 x 3.8 cm, previously 6.0 x 3.2 cm (series 2, image 17). No change in subcarinal and hilar lymph nodes. Interval decrease in size of a  left axillary lymph node, now measuring 1.4 x 0.7 cm, previously 2.2 x 1.5 cm (series 2, image 12). Thyroid gland, trachea, and esophagus demonstrate no significant findings. Lungs/Pleura: There are new trace bilateral pleural effusions. There is a new, wedge-shaped subpleural ground-glass opacity of the superior segment right lower lobe with some suggestion of central clearing (series 4, image 51). Redemonstrated bandlike scarring of the left lower lobe. Musculoskeletal: Interval increase in size of an extrathoracic soft tissue nodule, abutting the left second sternocostal junction and external to anterior mediastinal soft tissue. This nodule measures 2.4 x 2.0 cm, previously 1.7 x 1.6 cm when measured similarly (series 2, image 16). No change in sclerotic appearance of the manubrium. CT ABDOMEN PELVIS FINDINGS Hepatobiliary: No solid liver abnormality is seen. No gallstones, gallbladder wall thickening, or biliary dilatation. Pancreas: Unremarkable. No pancreatic ductal dilatation or surrounding inflammatory changes. Spleen: Normal in size without significant abnormality. Adrenals/Urinary Tract: Adrenal glands are unremarkable. Kidneys are normal, without renal calculi, solid lesion, or hydronephrosis. Bladder is unremarkable. Stomach/Bowel: Stomach is within normal limits. Appendix appears normal. No evidence of bowel wall thickening,  distention, or inflammatory changes. Vascular/Lymphatic: No significant vascular findings are present. No enlarged abdominal or pelvic lymph nodes. Reproductive: No mass or other abnormality. Other: No abdominal wall hernia or abnormality. No abdominopelvic ascites. Musculoskeletal: No acute or significant osseous findings. IMPRESSION: 1. There is a new, wedge-shaped subpleural ground-glass opacity of the superior segment right lower lobe with some suggestion of central clearing (series 4, image 51). This appearance is concerning for pulmonary embolus and pulmonary infarct,  particularly given acute clinical presentation of chest pain. There is no direct evidence of pulmonary embolism on this examination not tailored for assessment of the pulmonary arteries. Consider CT angiogram to further evaluate. 2. Redemonstrated bulky soft tissue nodules of the anterior mediastinum and AP window, slightly increased in size compared to prior examination measuring 6.8 x 3.8 cm, previously 6.0 x 3.2 cm (series 2, image 17). 3. Interval increase in size of an extrathoracic soft tissue nodule, abutting the left second sternocostal junction and external to anterior mediastinal soft tissue. This nodule measures 2.4 x 2.0 cm, previously 1.7 x 1.6 cm when measured similarly (series 2, image 16). This may correspond to reported palpable abnormality and source of pain. No change in sclerotic appearance of the manubrium. 4. Findings above are consistent with worsening metastatic disease. 5. Decreased size of left axillary lymph node, of uncertain significance, possibly reflecting mixed response to treatment. 6. No evidence of metastatic disease in the abdomen or pelvis. 7. New trace bilateral pleural effusions. These results will be called to the ordering clinician or representative by the Radiologist Assistant, and communication documented in the PACS or zVision Dashboard. Electronically Signed   By: Eddie Candle M.D.   On: 01/28/2019 12:24   Ct Abdomen Pelvis W Contrast  Result Date: 01/28/2019 CLINICAL DATA:  Left lung cancer, chemotherapy ongoing, worsening chest and back pain EXAM: CT CHEST, ABDOMEN, AND PELVIS WITH CONTRAST TECHNIQUE: Multidetector CT imaging of the chest, abdomen and pelvis was performed following the standard protocol during bolus administration of intravenous contrast. CONTRAST:  111mL OMNIPAQUE IOHEXOL 300 MG/ML  SOLN COMPARISON:  12/17/2018, 10/16/2018, 06/24/2018 FINDINGS: CT CHEST FINDINGS Cardiovascular: Left chest port catheter. Normal heart size. Trace pericardial  effusion. Mediastinum/Nodes: Redemonstrated bulky soft tissue nodules of the anterior mediastinum and AP window, slightly increased in size compared to prior examination measuring 6.8 x 3.8 cm, previously 6.0 x 3.2 cm (series 2, image 17). No change in subcarinal and hilar lymph nodes. Interval decrease in size of a left axillary lymph node, now measuring 1.4 x 0.7 cm, previously 2.2 x 1.5 cm (series 2, image 12). Thyroid gland, trachea, and esophagus demonstrate no significant findings. Lungs/Pleura: There are new trace bilateral pleural effusions. There is a new, wedge-shaped subpleural ground-glass opacity of the superior segment right lower lobe with some suggestion of central clearing (series 4, image 51). Redemonstrated bandlike scarring of the left lower lobe. Musculoskeletal: Interval increase in size of an extrathoracic soft tissue nodule, abutting the left second sternocostal junction and external to anterior mediastinal soft tissue. This nodule measures 2.4 x 2.0 cm, previously 1.7 x 1.6 cm when measured similarly (series 2, image 16). No change in sclerotic appearance of the manubrium. CT ABDOMEN PELVIS FINDINGS Hepatobiliary: No solid liver abnormality is seen. No gallstones, gallbladder wall thickening, or biliary dilatation. Pancreas: Unremarkable. No pancreatic ductal dilatation or surrounding inflammatory changes. Spleen: Normal in size without significant abnormality. Adrenals/Urinary Tract: Adrenal glands are unremarkable. Kidneys are normal, without renal calculi, solid lesion, or hydronephrosis. Bladder is unremarkable. Stomach/Bowel: Stomach  is within normal limits. Appendix appears normal. No evidence of bowel wall thickening, distention, or inflammatory changes. Vascular/Lymphatic: No significant vascular findings are present. No enlarged abdominal or pelvic lymph nodes. Reproductive: No mass or other abnormality. Other: No abdominal wall hernia or abnormality. No abdominopelvic ascites.  Musculoskeletal: No acute or significant osseous findings. IMPRESSION: 1. There is a new, wedge-shaped subpleural ground-glass opacity of the superior segment right lower lobe with some suggestion of central clearing (series 4, image 51). This appearance is concerning for pulmonary embolus and pulmonary infarct, particularly given acute clinical presentation of chest pain. There is no direct evidence of pulmonary embolism on this examination not tailored for assessment of the pulmonary arteries. Consider CT angiogram to further evaluate. 2. Redemonstrated bulky soft tissue nodules of the anterior mediastinum and AP window, slightly increased in size compared to prior examination measuring 6.8 x 3.8 cm, previously 6.0 x 3.2 cm (series 2, image 17). 3. Interval increase in size of an extrathoracic soft tissue nodule, abutting the left second sternocostal junction and external to anterior mediastinal soft tissue. This nodule measures 2.4 x 2.0 cm, previously 1.7 x 1.6 cm when measured similarly (series 2, image 16). This may correspond to reported palpable abnormality and source of pain. No change in sclerotic appearance of the manubrium. 4. Findings above are consistent with worsening metastatic disease. 5. Decreased size of left axillary lymph node, of uncertain significance, possibly reflecting mixed response to treatment. 6. No evidence of metastatic disease in the abdomen or pelvis. 7. New trace bilateral pleural effusions. These results will be called to the ordering clinician or representative by the Radiologist Assistant, and communication documented in the PACS or zVision Dashboard. Electronically Signed   By: Eddie Candle M.D.   On: 01/28/2019 12:24      IMPRESSION: Stage IV (T2a, N3, M1c) high-grade neuroendocrine carcinoma with focal areas of small cell carcinoma  Recent chest CT scan shows interval increase in the extrathoracic soft tissue nodule on the left second sternocostal junction.  This area is  causing pain for the patient. in addition  Adenopathy is seen along the anterior mediastinum which is increased in size.  Patient will be a good candidate for palliative radiation therapy directed both these areas.  Today, I talked to the patient and daughter about the findings and work-up thus far.  We discussed the natural history of high-grade neuroendocrine carcinoma with focal areas of small cell carcinoma and general treatment, highlighting the role of radiotherapy in the management.  We discussed the available radiation techniques, and focused on the details of logistics and delivery.  We reviewed the anticipated acute and late sequelae associated with radiation in this setting.  The patient was encouraged to ask questions that I answered to the best of my ability.  A patient consent form was discussed and signed.  We retained a copy for our records.  The patient would like to proceed with radiation and will be scheduled for CT simulation.  PLAN: CT simulation in the near future.  Anticipate 10 treatments to the areas of concern along the anterior chest region.    ------------------------------------------------  Blair Promise, PhD, MD  This document serves as a record of services personally performed by Gery Pray, MD. It was created on his behalf by Wilburn Mylar, a trained medical scribe. The creation of this record is based on the scribe's personal observations and the provider's statements to them. This document has been checked and approved by the attending provider.

## 2019-02-05 NOTE — Patient Instructions (Signed)
Coronavirus (COVID-19) Are you at risk?  Are you at risk for the Coronavirus (COVID-19)?  To be considered HIGH RISK for Coronavirus (COVID-19), you have to meet the following criteria:  . Traveled to [place], [place], [place], [place] or [place]; or in the [place] to [place], [place], [place], or [place]; and have fever, cough, and shortness of breath within the last 2 weeks of travel OR . Been in close contact with a person diagnosed with COVID-19 within the last 2 weeks and have fever, cough, and shortness of breath . IF YOU DO NOT MEET THESE CRITERIA, YOU ARE CONSIDERED LOW RISK FOR COVID-19.  What to do if you are HIGH RISK for COVID-19?  . If you are having a medical emergency, call 911. . Seek medical care right away. Before you go to a doctor's office, urgent care or emergency department, call ahead and tell them about your recent travel, contact with someone diagnosed with COVID-19, and your symptoms. You should receive instructions from your physician's office regarding next steps of care.  . When you arrive at healthcare provider, tell the healthcare staff immediately you have returned from visiting [place], [place], [place], [place] or [place]; or traveled in the [place] to [place], [place], [place], or [place]; in the last two weeks or you have been in close contact with a person diagnosed with COVID-19 in the last 2 weeks.   . Tell the health care staff about your symptoms: fever, cough and shortness of breath. . After you have been seen by a medical provider, you will be either: o Tested for (COVID-19) and discharged home on quarantine except to seek medical care if symptoms worsen, and asked to  - Stay home and avoid contact with others until you get your results (4-5 days)  - Avoid travel on public transportation if possible (such as bus, train, or airplane) or o Sent to the Emergency Department by EMS for evaluation, COVID-19 testing, and possible  admission depending on your condition and test results.  What to do if you are LOW RISK for COVID-19?  Reduce your risk of any infection by using the same precautions used for avoiding the common cold or flu:  . Wash your hands often with soap and warm water for at least 20 seconds.  If soap and water are not readily available, use an alcohol-based hand sanitizer with at least 60% alcohol.  . If coughing or sneezing, cover your mouth and nose by coughing or sneezing into the elbow areas of your shirt or coat, into a tissue or into your sleeve (not your hands). . Avoid shaking hands with others and consider head nods or verbal greetings only. . Avoid touching your eyes, nose, or mouth with unwashed hands.  . Avoid close contact with people who are sick. . Avoid places or events with large numbers of people in one location, like concerts or sporting events. . Carefully consider travel plans you have or are making. . If you are planning any travel outside or inside the [place], visit the CDC's Travelers' Health webpage for the latest health notices. . If you have some symptoms but not all symptoms, continue to monitor at home and seek medical attention if your symptoms worsen. . If you are having a medical emergency, call 911.   ADDITIONAL HEALTHCARE OPTIONS FOR PATIENTS  [facility] / e-Visit: [email or web address]         [facility]: [phone]  [facility]: [phone]                   [facility]: [phone]   

## 2019-02-06 ENCOUNTER — Inpatient Hospital Stay: Payer: Medicaid Other

## 2019-02-06 ENCOUNTER — Other Ambulatory Visit: Payer: Self-pay

## 2019-02-06 DIAGNOSIS — D6481 Anemia due to antineoplastic chemotherapy: Secondary | ICD-10-CM

## 2019-02-06 DIAGNOSIS — Z5111 Encounter for antineoplastic chemotherapy: Secondary | ICD-10-CM | POA: Diagnosis not present

## 2019-02-06 LAB — PREPARE RBC (CROSSMATCH)

## 2019-02-06 MED ORDER — HEPARIN SOD (PORK) LOCK FLUSH 100 UNIT/ML IV SOLN
250.0000 [IU] | INTRAVENOUS | Status: AC | PRN
  Administered 2019-02-06: 500 [IU]
  Filled 2019-02-06: qty 5

## 2019-02-06 MED ORDER — DIPHENHYDRAMINE HCL 25 MG PO CAPS
25.0000 mg | ORAL_CAPSULE | Freq: Once | ORAL | Status: AC
  Administered 2019-02-06: 25 mg via ORAL

## 2019-02-06 MED ORDER — SODIUM CHLORIDE 0.9% IV SOLUTION
250.0000 mL | Freq: Once | INTRAVENOUS | Status: AC
  Administered 2019-02-06: 250 mL via INTRAVENOUS
  Filled 2019-02-06: qty 250

## 2019-02-06 MED ORDER — ACETAMINOPHEN 325 MG PO TABS
ORAL_TABLET | ORAL | Status: AC
  Filled 2019-02-06: qty 2

## 2019-02-06 MED ORDER — DIPHENHYDRAMINE HCL 25 MG PO CAPS
ORAL_CAPSULE | ORAL | Status: AC
  Filled 2019-02-06: qty 1

## 2019-02-06 MED ORDER — ACETAMINOPHEN 325 MG PO TABS
650.0000 mg | ORAL_TABLET | Freq: Once | ORAL | Status: AC
  Administered 2019-02-06: 650 mg via ORAL

## 2019-02-06 MED ORDER — SODIUM CHLORIDE 0.9% FLUSH
3.0000 mL | INTRAVENOUS | Status: AC | PRN
  Administered 2019-02-06: 10 mL
  Filled 2019-02-06: qty 10

## 2019-02-06 NOTE — Patient Instructions (Signed)
Blood Transfusion, Adult, Care After This sheet gives you information about how to care for yourself after your procedure. Your doctor may also give you more specific instructions. If you have problems or questions, contact your doctor. Follow these instructions at home:   Take over-the-counter and prescription medicines only as told by your doctor.  Go back to your normal activities as told by your doctor.  Follow instructions from your doctor about how to take care of the area where an IV tube was put into your vein (insertion site). Make sure you: ? Wash your hands with soap and water before you change your bandage (dressing). If there is no soap and water, use hand sanitizer. ? Change your bandage as told by your doctor.  Check your IV insertion site every day for signs of infection. Check for: ? More redness, swelling, or pain. ? More fluid or blood. ? Warmth. ? Pus or a bad smell. Contact a doctor if:  You have more redness, swelling, or pain around the IV insertion site.  You have more fluid or blood coming from the IV insertion site.  Your IV insertion site feels warm to the touch.  You have pus or a bad smell coming from the IV insertion site.  Your pee (urine) turns pink, red, or brown.  You feel weak after doing your normal activities. Get help right away if:  You have signs of a serious allergic or body defense (immune) system reaction, including: ? Itchiness. ? Hives. ? Trouble breathing. ? Anxiety. ? Pain in your chest or lower back. ? Fever, flushing, and chills. ? Fast pulse. ? Rash. ? Watery poop (diarrhea). ? Throwing up (vomiting). ? Dark pee. ? Serious headache. ? Dizziness. ? Stiff neck. ? Yellow color in your face or the white parts of your eyes (jaundice). Summary  After a blood transfusion, return to your normal activities as told by your doctor.  Every day, check for signs of infection where the IV tube was put into your vein.  Some  signs of infection are warm skin, more redness and pain, more fluid or blood, and pus or a bad smell where the needle went in.  Contact your doctor if you feel weak or have any unusual symptoms. This information is not intended to replace advice given to you by your health care provider. Make sure you discuss any questions you have with your health care provider. Document Released: 04/24/2014 Document Revised: 08/08/2017 Document Reviewed: 11/26/2015 Elsevier Patient Education  2020 Elsevier Inc.  

## 2019-02-07 LAB — TYPE AND SCREEN
ABO/RH(D): O POS
Antibody Screen: NEGATIVE
Unit division: 0

## 2019-02-07 LAB — BPAM RBC
Blood Product Expiration Date: 202011242359
ISSUE DATE / TIME: 202010221455
Unit Type and Rh: 5100

## 2019-02-10 ENCOUNTER — Ambulatory Visit
Admission: RE | Admit: 2019-02-10 | Discharge: 2019-02-10 | Disposition: A | Discharge status: Home / Self Care | Payer: Medicaid Other | Source: Ambulatory Visit | Attending: Radiation Oncology | Admitting: Radiation Oncology

## 2019-02-10 ENCOUNTER — Other Ambulatory Visit: Payer: Self-pay

## 2019-02-10 ENCOUNTER — Ambulatory Visit: Payer: Medicaid Other | Admitting: Radiation Oncology

## 2019-02-10 DIAGNOSIS — Z510 Encounter for antineoplastic radiation therapy: Secondary | ICD-10-CM | POA: Diagnosis not present

## 2019-02-10 DIAGNOSIS — C3412 Malignant neoplasm of upper lobe, left bronchus or lung: Secondary | ICD-10-CM

## 2019-02-10 DIAGNOSIS — C7A1 Malignant poorly differentiated neuroendocrine tumors: Secondary | ICD-10-CM | POA: Diagnosis not present

## 2019-02-10 NOTE — Progress Notes (Signed)
  Radiation Oncology         [phone] ________________________________  Name: Samantha Santos MRN: [id number]  Date: 02/10/2019  DOB: July 01, 1960  SIMULATION AND TREATMENT PLANNING NOTE    ICD-10-CM   1. Small cell carcinoma of upper lobe of left lung (HCC)  C34.12     DIAGNOSIS:  Stage IV (T2a, N3, M1c) high-grade neuroendocrine carcinoma with focal areas of small cell carcinoma  NARRATIVE:  The patient was brought to the [facility].  Identity was confirmed.  All relevant records and images related to the planned course of therapy were reviewed.  The patient freely provided informed written consent to proceed with treatment after reviewing the details related to the planned course of therapy. The consent form was witnessed and verified by the simulation staff.  Then, the patient was set-up in a stable reproducible  supine position for radiation therapy.  CT images were obtained.  Surface markings were placed.  The CT images were loaded into the planning software.  Then the target and avoidance structures were contoured.  Treatment planning then occurred.  The radiation prescription was entered and confirmed.  Then, I designed and supervised the construction of a total of 5 medically necessary complex treatment devices.  I have requested : 3D Simulation  I have requested a DVH of the following structures: heart, lungs, spinal cord, esophagus, GTV, PTV.  I have ordered:dose calc.   PLAN:  The patient will receive 30 Gy in 10 fractions directed at  the extrathoracic soft tissue nodule on the left second sternocostal junction and adenopathy that is seen along the anterior mediastinum which is increased in size.   -----------------------------------  Blair Promise, PhD, MD

## 2019-02-11 ENCOUNTER — Other Ambulatory Visit: Payer: Self-pay

## 2019-02-11 ENCOUNTER — Inpatient Hospital Stay: Payer: Medicaid Other

## 2019-02-11 ENCOUNTER — Ambulatory Visit: Payer: Medicaid Other

## 2019-02-11 VITALS — BP 126/89 | HR 94 | Temp 97.8°F | Resp 18

## 2019-02-11 DIAGNOSIS — Z510 Encounter for antineoplastic radiation therapy: Secondary | ICD-10-CM | POA: Diagnosis not present

## 2019-02-11 DIAGNOSIS — C3412 Malignant neoplasm of upper lobe, left bronchus or lung: Secondary | ICD-10-CM

## 2019-02-11 DIAGNOSIS — Z5111 Encounter for antineoplastic chemotherapy: Secondary | ICD-10-CM | POA: Diagnosis not present

## 2019-02-11 LAB — SAMPLE TO BLOOD BANK

## 2019-02-11 LAB — CBC WITH DIFFERENTIAL (CANCER CENTER ONLY)
Abs Immature Granulocytes: 0.04 10*3/uL (ref 0.00–0.07)
Basophils Absolute: 0 10*3/uL (ref 0.0–0.1)
Basophils Relative: 0 %
Eosinophils Absolute: 0 10*3/uL (ref 0.0–0.5)
Eosinophils Relative: 0 %
HCT: 28.7 % — ABNORMAL LOW (ref 36.0–46.0)
Hemoglobin: 9.2 g/dL — ABNORMAL LOW (ref 12.0–15.0)
Immature Granulocytes: 1 %
Lymphocytes Relative: 31 %
Lymphs Abs: 1.1 10*3/uL (ref 0.7–4.0)
MCH: 27.5 pg (ref 26.0–34.0)
MCHC: 32.1 g/dL (ref 30.0–36.0)
MCV: 85.9 fL (ref 80.0–100.0)
Monocytes Absolute: 0.2 10*3/uL (ref 0.1–1.0)
Monocytes Relative: 6 %
Neutro Abs: 2.2 10*3/uL (ref 1.7–7.7)
Neutrophils Relative %: 62 %
Platelet Count: 120 10*3/uL — ABNORMAL LOW (ref 150–400)
RBC: 3.34 MIL/uL — ABNORMAL LOW (ref 3.87–5.11)
RDW: 17.2 % — ABNORMAL HIGH (ref 11.5–15.5)
WBC Count: 3.6 10*3/uL — ABNORMAL LOW (ref 4.0–10.5)
nRBC: 0 % (ref 0.0–0.2)

## 2019-02-11 LAB — CMP (CANCER CENTER ONLY)
ALT: 21 U/L (ref 0–44)
AST: 9 U/L — ABNORMAL LOW (ref 15–41)
Albumin: 3.4 g/dL — ABNORMAL LOW (ref 3.5–5.0)
Alkaline Phosphatase: 91 U/L (ref 38–126)
Anion gap: 11 (ref 5–15)
BUN: 16 mg/dL (ref 6–20)
CO2: 26 mmol/L (ref 22–32)
Calcium: 8.9 mg/dL (ref 8.9–10.3)
Chloride: 106 mmol/L (ref 98–111)
Creatinine: 1.42 mg/dL — ABNORMAL HIGH (ref 0.44–1.00)
GFR, Est AFR Am: 47 mL/min — ABNORMAL LOW (ref >60)
GFR, Estimated: 41 mL/min — ABNORMAL LOW (ref >60)
Glucose, Bld: 126 mg/dL — ABNORMAL HIGH (ref 70–99)
Potassium: 3.7 mmol/L (ref 3.5–5.1)
Sodium: 143 mmol/L (ref 135–145)
Total Bilirubin: 0.6 mg/dL (ref 0.3–1.2)
Total Protein: 7.6 g/dL (ref 6.5–8.1)

## 2019-02-11 LAB — MAGNESIUM: Magnesium: 1.5 mg/dL — ABNORMAL LOW (ref 1.7–2.4)

## 2019-02-11 MED ORDER — SODIUM CHLORIDE 0.9 % IV SOLN
Freq: Once | INTRAVENOUS | Status: AC
  Administered 2019-02-11: 13:00:00 via INTRAVENOUS
  Filled 2019-02-11: qty 5

## 2019-02-11 MED ORDER — POTASSIUM CHLORIDE 2 MEQ/ML IV SOLN
Freq: Once | INTRAVENOUS | Status: AC
  Administered 2019-02-11: 11:00:00 via INTRAVENOUS
  Filled 2019-02-11: qty 10

## 2019-02-11 MED ORDER — SODIUM CHLORIDE 0.9 % IV SOLN
Freq: Once | INTRAVENOUS | Status: AC
  Administered 2019-02-11: 10:00:00 via INTRAVENOUS
  Filled 2019-02-11: qty 250

## 2019-02-11 MED ORDER — IRINOTECAN HCL CHEMO INJECTION 100 MG/5ML
50.0000 mg/m2 | Freq: Once | INTRAVENOUS | Status: AC
  Administered 2019-02-11: 120 mg via INTRAVENOUS
  Filled 2019-02-11: qty 6

## 2019-02-11 MED ORDER — PALONOSETRON HCL INJECTION 0.25 MG/5ML
INTRAVENOUS | Status: AC
  Filled 2019-02-11: qty 5

## 2019-02-11 MED ORDER — SODIUM CHLORIDE 0.9 % IV SOLN
25.0000 mg/m2 | Freq: Once | INTRAVENOUS | Status: AC
  Administered 2019-02-11: 62 mg via INTRAVENOUS
  Filled 2019-02-11: qty 62

## 2019-02-11 MED ORDER — HEPARIN SOD (PORK) LOCK FLUSH 100 UNIT/ML IV SOLN
500.0000 [IU] | Freq: Once | INTRAVENOUS | Status: AC | PRN
  Administered 2019-02-11: 18:00:00 500 [IU]
  Filled 2019-02-11: qty 5

## 2019-02-11 MED ORDER — PALONOSETRON HCL INJECTION 0.25 MG/5ML
0.2500 mg | Freq: Once | INTRAVENOUS | Status: AC
  Administered 2019-02-11: 0.25 mg via INTRAVENOUS

## 2019-02-11 MED ORDER — ATROPINE SULFATE 1 MG/ML IJ SOLN
0.5000 mg | Freq: Once | INTRAMUSCULAR | Status: AC | PRN
  Administered 2019-02-11: 0.5 mg via INTRAVENOUS

## 2019-02-11 MED ORDER — ATROPINE SULFATE 1 MG/ML IJ SOLN
INTRAMUSCULAR | Status: AC
  Filled 2019-02-11: qty 1

## 2019-02-11 MED ORDER — SODIUM CHLORIDE 0.9% FLUSH
10.0000 mL | INTRAVENOUS | Status: DC | PRN
  Administered 2019-02-11: 10 mL
  Filled 2019-02-11: qty 10

## 2019-02-11 NOTE — Patient Instructions (Signed)
[facility] Discharge Instructions for Patients Receiving Chemotherapy  Today you received the following chemotherapy agents :  Irinotecan,  Cisplatin.  To help prevent nausea and vomiting after your treatment, we encourage you to take your nausea medication as prescribed.   If you develop nausea and vomiting that is not controlled by your nausea medication, call the clinic.   BELOW ARE SYMPTOMS THAT SHOULD BE REPORTED IMMEDIATELY:  *FEVER GREATER THAN 100.5 F  *CHILLS WITH OR WITHOUT FEVER  NAUSEA AND VOMITING THAT IS NOT CONTROLLED WITH YOUR NAUSEA MEDICATION  *UNUSUAL SHORTNESS OF BREATH  *UNUSUAL BRUISING OR BLEEDING  TENDERNESS IN MOUTH AND THROAT WITH OR WITHOUT PRESENCE OF ULCERS  *URINARY PROBLEMS  *BOWEL PROBLEMS  UNUSUAL RASH Items with * indicate a potential emergency and should be followed up as soon as possible.  Feel free to call the clinic should you have any questions or concerns. The clinic phone number is [phone].  Please show the [facility] at check-in to the Emergency Department and triage nurse.

## 2019-02-11 NOTE — Progress Notes (Signed)
Mg = 1.5 today. Will incr Mg in IVF to 4 gm total in hydration fluids per Cassie Heilingoetter, PA. Kennith Center, Pharm.D., CPP 02/11/2019@10 :34 AM

## 2019-02-11 NOTE — Patient Instructions (Signed)

## 2019-02-12 ENCOUNTER — Ambulatory Visit: Payer: Medicaid Other

## 2019-02-12 ENCOUNTER — Telehealth: Payer: Self-pay | Admitting: Medical Oncology

## 2019-02-12 NOTE — Telephone Encounter (Signed)
[facility] rescheduled to Friday -DTR notified.

## 2019-02-13 ENCOUNTER — Ambulatory Visit
Admission: RE | Admit: 2019-02-13 | Discharge: 2019-02-13 | Disposition: A | Discharge status: Home / Self Care | Payer: Medicaid Other | Source: Ambulatory Visit | Attending: Radiation Oncology | Admitting: Radiation Oncology

## 2019-02-13 ENCOUNTER — Ambulatory Visit: Payer: Medicaid Other

## 2019-02-13 ENCOUNTER — Other Ambulatory Visit: Payer: Self-pay

## 2019-02-13 DIAGNOSIS — Z510 Encounter for antineoplastic radiation therapy: Secondary | ICD-10-CM | POA: Diagnosis not present

## 2019-02-14 ENCOUNTER — Ambulatory Visit: Payer: Medicaid Other

## 2019-02-14 ENCOUNTER — Other Ambulatory Visit: Payer: Self-pay

## 2019-02-14 ENCOUNTER — Ambulatory Visit
Admission: RE | Admit: 2019-02-14 | Discharge: 2019-02-14 | Disposition: A | Discharge status: Home / Self Care | Payer: Medicaid Other | Source: Ambulatory Visit | Attending: Radiation Oncology | Admitting: Radiation Oncology

## 2019-02-14 DIAGNOSIS — Z510 Encounter for antineoplastic radiation therapy: Secondary | ICD-10-CM | POA: Diagnosis not present

## 2019-02-17 ENCOUNTER — Ambulatory Visit: Payer: Medicaid Other

## 2019-02-17 ENCOUNTER — Other Ambulatory Visit: Payer: Self-pay

## 2019-02-17 ENCOUNTER — Ambulatory Visit
Admission: RE | Admit: 2019-02-17 | Discharge: 2019-02-17 | Disposition: A | Discharge status: Home / Self Care | Payer: Medicaid Other | Source: Ambulatory Visit | Attending: Radiation Oncology | Admitting: Radiation Oncology

## 2019-02-17 DIAGNOSIS — C7A1 Malignant poorly differentiated neuroendocrine tumors: Secondary | ICD-10-CM | POA: Insufficient documentation

## 2019-02-17 DIAGNOSIS — Z510 Encounter for antineoplastic radiation therapy: Secondary | ICD-10-CM | POA: Diagnosis present

## 2019-02-18 ENCOUNTER — Other Ambulatory Visit: Payer: Self-pay

## 2019-02-18 ENCOUNTER — Ambulatory Visit
Admission: RE | Admit: 2019-02-18 | Discharge: 2019-02-18 | Disposition: A | Discharge status: Home / Self Care | Payer: Medicaid Other | Source: Ambulatory Visit | Attending: Radiation Oncology | Admitting: Radiation Oncology

## 2019-02-18 ENCOUNTER — Ambulatory Visit: Payer: Medicaid Other

## 2019-02-18 DIAGNOSIS — Z510 Encounter for antineoplastic radiation therapy: Secondary | ICD-10-CM | POA: Diagnosis not present

## 2019-02-19 ENCOUNTER — Other Ambulatory Visit: Payer: Self-pay

## 2019-02-19 ENCOUNTER — Ambulatory Visit: Payer: Medicaid Other

## 2019-02-19 ENCOUNTER — Ambulatory Visit
Admission: RE | Admit: 2019-02-19 | Discharge: 2019-02-19 | Disposition: A | Discharge status: Home / Self Care | Payer: Medicaid Other | Source: Ambulatory Visit | Attending: Radiation Oncology | Admitting: Radiation Oncology

## 2019-02-19 DIAGNOSIS — Z510 Encounter for antineoplastic radiation therapy: Secondary | ICD-10-CM | POA: Diagnosis not present

## 2019-02-20 ENCOUNTER — Ambulatory Visit: Payer: Medicaid Other

## 2019-02-20 ENCOUNTER — Other Ambulatory Visit: Payer: Self-pay

## 2019-02-20 ENCOUNTER — Ambulatory Visit
Admission: RE | Admit: 2019-02-20 | Discharge: 2019-02-20 | Disposition: A | Discharge status: Home / Self Care | Payer: Medicaid Other | Source: Ambulatory Visit | Attending: Radiation Oncology | Admitting: Radiation Oncology

## 2019-02-20 DIAGNOSIS — Z510 Encounter for antineoplastic radiation therapy: Secondary | ICD-10-CM | POA: Diagnosis not present

## 2019-02-21 ENCOUNTER — Other Ambulatory Visit: Payer: Self-pay

## 2019-02-21 ENCOUNTER — Ambulatory Visit: Payer: Medicaid Other

## 2019-02-21 ENCOUNTER — Ambulatory Visit
Admission: RE | Admit: 2019-02-21 | Discharge: 2019-02-21 | Disposition: A | Discharge status: Home / Self Care | Payer: Medicaid Other | Source: Ambulatory Visit | Attending: Radiation Oncology | Admitting: Radiation Oncology

## 2019-02-21 DIAGNOSIS — Z510 Encounter for antineoplastic radiation therapy: Secondary | ICD-10-CM | POA: Diagnosis not present

## 2019-02-24 ENCOUNTER — Ambulatory Visit
Admission: RE | Admit: 2019-02-24 | Discharge: 2019-02-24 | Disposition: A | Discharge status: Home / Self Care | Payer: Medicaid Other | Source: Ambulatory Visit | Attending: Radiation Oncology | Admitting: Radiation Oncology

## 2019-02-24 ENCOUNTER — Other Ambulatory Visit: Payer: Self-pay

## 2019-02-24 DIAGNOSIS — Z510 Encounter for antineoplastic radiation therapy: Secondary | ICD-10-CM | POA: Diagnosis not present

## 2019-02-25 ENCOUNTER — Inpatient Hospital Stay: Payer: Medicaid Other

## 2019-02-25 ENCOUNTER — Ambulatory Visit
Admission: RE | Admit: 2019-02-25 | Discharge: 2019-02-25 | Disposition: A | Discharge status: Home / Self Care | Payer: Medicaid Other | Source: Ambulatory Visit | Attending: Radiation Oncology | Admitting: Radiation Oncology

## 2019-02-25 ENCOUNTER — Other Ambulatory Visit: Payer: Self-pay

## 2019-02-25 ENCOUNTER — Other Ambulatory Visit: Payer: Self-pay | Admitting: Medical

## 2019-02-25 ENCOUNTER — Encounter: Payer: Self-pay | Admitting: Internal Medicine

## 2019-02-25 ENCOUNTER — Inpatient Hospital Stay: Payer: Medicaid Other | Attending: Internal Medicine

## 2019-02-25 ENCOUNTER — Inpatient Hospital Stay (HOSPITAL_BASED_OUTPATIENT_CLINIC_OR_DEPARTMENT_OTHER): Payer: Medicaid Other | Admitting: Internal Medicine

## 2019-02-25 VITALS — BP 147/98 | HR 99 | Temp 98.5°F | Resp 19 | Ht 67.0 in | Wt 272.6 lb

## 2019-02-25 VITALS — BP 131/87 | HR 77 | Temp 98.3°F | Resp 20

## 2019-02-25 DIAGNOSIS — Z510 Encounter for antineoplastic radiation therapy: Secondary | ICD-10-CM | POA: Diagnosis not present

## 2019-02-25 DIAGNOSIS — Z79899 Other long term (current) drug therapy: Secondary | ICD-10-CM | POA: Diagnosis not present

## 2019-02-25 DIAGNOSIS — D696 Thrombocytopenia, unspecified: Secondary | ICD-10-CM | POA: Insufficient documentation

## 2019-02-25 DIAGNOSIS — C7A1 Malignant poorly differentiated neuroendocrine tumors: Secondary | ICD-10-CM | POA: Diagnosis not present

## 2019-02-25 DIAGNOSIS — K219 Gastro-esophageal reflux disease without esophagitis: Secondary | ICD-10-CM | POA: Diagnosis not present

## 2019-02-25 DIAGNOSIS — T451X5A Adverse effect of antineoplastic and immunosuppressive drugs, initial encounter: Secondary | ICD-10-CM | POA: Diagnosis not present

## 2019-02-25 DIAGNOSIS — I10 Essential (primary) hypertension: Secondary | ICD-10-CM | POA: Diagnosis not present

## 2019-02-25 DIAGNOSIS — Z95828 Presence of other vascular implants and grafts: Secondary | ICD-10-CM

## 2019-02-25 DIAGNOSIS — D701 Agranulocytosis secondary to cancer chemotherapy: Secondary | ICD-10-CM | POA: Diagnosis not present

## 2019-02-25 DIAGNOSIS — M1990 Unspecified osteoarthritis, unspecified site: Secondary | ICD-10-CM | POA: Insufficient documentation

## 2019-02-25 DIAGNOSIS — D6481 Anemia due to antineoplastic chemotherapy: Secondary | ICD-10-CM | POA: Insufficient documentation

## 2019-02-25 DIAGNOSIS — C3490 Malignant neoplasm of unspecified part of unspecified bronchus or lung: Secondary | ICD-10-CM | POA: Insufficient documentation

## 2019-02-25 DIAGNOSIS — G893 Neoplasm related pain (acute) (chronic): Secondary | ICD-10-CM | POA: Diagnosis not present

## 2019-02-25 DIAGNOSIS — R110 Nausea: Secondary | ICD-10-CM

## 2019-02-25 DIAGNOSIS — C3412 Malignant neoplasm of upper lobe, left bronchus or lung: Secondary | ICD-10-CM

## 2019-02-25 LAB — CMP (CANCER CENTER ONLY)
ALT: 7 U/L (ref 0–44)
AST: <6 U/L — ABNORMAL LOW (ref 15–41)
Albumin: 3.1 g/dL — ABNORMAL LOW (ref 3.5–5.0)
Alkaline Phosphatase: 83 U/L (ref 38–126)
Anion gap: 12 (ref 5–15)
BUN: 16 mg/dL (ref 6–20)
CO2: 24 mmol/L (ref 22–32)
Calcium: 7.7 mg/dL — ABNORMAL LOW (ref 8.9–10.3)
Chloride: 107 mmol/L (ref 98–111)
Creatinine: 1.43 mg/dL — ABNORMAL HIGH (ref 0.44–1.00)
GFR, Est AFR Am: 47 mL/min — ABNORMAL LOW (ref >60)
GFR, Estimated: 40 mL/min — ABNORMAL LOW (ref >60)
Glucose, Bld: 110 mg/dL — ABNORMAL HIGH (ref 70–99)
Potassium: 3.3 mmol/L — ABNORMAL LOW (ref 3.5–5.1)
Sodium: 143 mmol/L (ref 135–145)
Total Bilirubin: 0.9 mg/dL (ref 0.3–1.2)
Total Protein: 6.6 g/dL (ref 6.5–8.1)

## 2019-02-25 LAB — CBC WITH DIFFERENTIAL (CANCER CENTER ONLY)
Abs Immature Granulocytes: 0 10*3/uL (ref 0.00–0.07)
Basophils Absolute: 0 10*3/uL (ref 0.0–0.1)
Basophils Relative: 0 %
Eosinophils Absolute: 0 10*3/uL (ref 0.0–0.5)
Eosinophils Relative: 2 %
HCT: 22.4 % — ABNORMAL LOW (ref 36.0–46.0)
Hemoglobin: 7.3 g/dL — ABNORMAL LOW (ref 12.0–15.0)
Immature Granulocytes: 0 %
Lymphocytes Relative: 25 %
Lymphs Abs: 0.3 10*3/uL — ABNORMAL LOW (ref 0.7–4.0)
MCH: 28.4 pg (ref 26.0–34.0)
MCHC: 32.6 g/dL (ref 30.0–36.0)
MCV: 87.2 fL (ref 80.0–100.0)
Monocytes Absolute: 0.1 10*3/uL (ref 0.1–1.0)
Monocytes Relative: 10 %
Neutro Abs: 0.9 10*3/uL — ABNORMAL LOW (ref 1.7–7.7)
Neutrophils Relative %: 63 %
Platelet Count: 72 10*3/uL — ABNORMAL LOW (ref 150–400)
RBC: 2.57 MIL/uL — ABNORMAL LOW (ref 3.87–5.11)
RDW: 18.5 % — ABNORMAL HIGH (ref 11.5–15.5)
WBC Count: 1.3 10*3/uL — ABNORMAL LOW (ref 4.0–10.5)
nRBC: 0 % (ref 0.0–0.2)

## 2019-02-25 LAB — PREPARE RBC (CROSSMATCH)

## 2019-02-25 LAB — MAGNESIUM: Magnesium: 1.2 mg/dL — CL (ref 1.7–2.4)

## 2019-02-25 MED ORDER — HEPARIN SOD (PORK) LOCK FLUSH 100 UNIT/ML IV SOLN
500.0000 [IU] | Freq: Every day | INTRAVENOUS | Status: AC | PRN
  Administered 2019-02-25: 500 [IU]
  Filled 2019-02-25: qty 5

## 2019-02-25 MED ORDER — SODIUM CHLORIDE 0.9% FLUSH
10.0000 mL | INTRAVENOUS | Status: DC | PRN
  Administered 2019-02-25: 10 mL
  Filled 2019-02-25: qty 10

## 2019-02-25 MED ORDER — SODIUM CHLORIDE 0.9% FLUSH
10.0000 mL | INTRAVENOUS | Status: AC | PRN
  Administered 2019-02-25: 10 mL
  Filled 2019-02-25: qty 10

## 2019-02-25 MED ORDER — DIPHENHYDRAMINE HCL 25 MG PO CAPS
25.0000 mg | ORAL_CAPSULE | Freq: Once | ORAL | Status: AC
  Administered 2019-02-25: 25 mg via ORAL

## 2019-02-25 MED ORDER — ACETAMINOPHEN 325 MG PO TABS
ORAL_TABLET | ORAL | Status: AC
  Filled 2019-02-25: qty 2

## 2019-02-25 MED ORDER — ONDANSETRON HCL 8 MG PO TABS: 8.0000 mg | ORAL_TABLET | Freq: Once | ORAL | Status: DC

## 2019-02-25 MED ORDER — SODIUM CHLORIDE 0.9 % IV SOLN
INTRAVENOUS | Status: DC
  Administered 2019-02-25: 11:00:00 via INTRAVENOUS
  Filled 2019-02-25: qty 250

## 2019-02-25 MED ORDER — HEPARIN SOD (PORK) LOCK FLUSH 100 UNIT/ML IV SOLN
500.0000 [IU] | Freq: Once | INTRAVENOUS | Status: AC | PRN
  Administered 2019-02-25: 500 [IU]
  Filled 2019-02-25: qty 5

## 2019-02-25 MED ORDER — SODIUM CHLORIDE 0.9 % IV SOLN: 4.0000 g | Freq: Once | INTRAVENOUS | Status: DC

## 2019-02-25 MED ORDER — SODIUM CHLORIDE 0.9% IV SOLUTION
250.0000 mL | Freq: Once | INTRAVENOUS | Status: AC
  Administered 2019-02-25: 250 mL via INTRAVENOUS
  Filled 2019-02-25: qty 250

## 2019-02-25 MED ORDER — ONDANSETRON HCL 8 MG PO TABS
ORAL_TABLET | ORAL | Status: AC
  Filled 2019-02-25: qty 1

## 2019-02-25 MED ORDER — DIPHENHYDRAMINE HCL 25 MG PO CAPS
ORAL_CAPSULE | ORAL | Status: AC
  Filled 2019-02-25: qty 1

## 2019-02-25 MED ORDER — ONDANSETRON HCL 8 MG PO TABS
8.0000 mg | ORAL_TABLET | Freq: Once | ORAL | Status: AC
  Administered 2019-02-25: 8 mg via ORAL

## 2019-02-25 MED ORDER — ACETAMINOPHEN 325 MG PO TABS
650.0000 mg | ORAL_TABLET | Freq: Once | ORAL | Status: AC
  Administered 2019-02-25: 650 mg via ORAL

## 2019-02-25 MED ORDER — MAGNESIUM SULFATE 4 GM/100ML IV SOLN
4.0000 g | Freq: Once | INTRAVENOUS | Status: AC
  Administered 2019-02-25: 4 g via INTRAVENOUS
  Filled 2019-02-25: qty 100

## 2019-02-25 NOTE — Addendum Note (Signed)
Addended by: Ardeen Garland on: 02/25/2019 10:15 AM   Modules accepted: Orders, SmartSet

## 2019-02-25 NOTE — Patient Instructions (Signed)
Blood Transfusion, Adult, Care After This sheet gives you information about how to care for yourself after your procedure. Your doctor may also give you more specific instructions. If you have problems or questions, contact your doctor. Follow these instructions at home:   Take over-the-counter and prescription medicines only as told by your doctor.  Go back to your normal activities as told by your doctor.  Follow instructions from your doctor about how to take care of the area where an IV tube was put into your vein (insertion site). Make sure you: ? Wash your hands with soap and water before you change your bandage (dressing). If there is no soap and water, use hand sanitizer. ? Change your bandage as told by your doctor.  Check your IV insertion site every day for signs of infection. Check for: ? More redness, swelling, or pain. ? More fluid or blood. ? Warmth. ? Pus or a bad smell. Contact a doctor if:  You have more redness, swelling, or pain around the IV insertion site.  You have more fluid or blood coming from the IV insertion site.  Your IV insertion site feels warm to the touch.  You have pus or a bad smell coming from the IV insertion site.  Your pee (urine) turns pink, red, or brown.  You feel weak after doing your normal activities. Get help right away if:  You have signs of a serious allergic or body defense (immune) system reaction, including: ? Itchiness. ? Hives. ? Trouble breathing. ? Anxiety. ? Pain in your chest or lower back. ? Fever, flushing, and chills. ? Fast pulse. ? Rash. ? Watery poop (diarrhea). ? Throwing up (vomiting). ? Dark pee. ? Serious headache. ? Dizziness. ? Stiff neck. ? Yellow color in your face or the white parts of your eyes (jaundice). Summary  After a blood transfusion, return to your normal activities as told by your doctor.  Every day, check for signs of infection where the IV tube was put into your vein.  Some  signs of infection are warm skin, more redness and pain, more fluid or blood, and pus or a bad smell where the needle went in.  Contact your doctor if you feel weak or have any unusual symptoms. This information is not intended to replace advice given to you by your health care provider. Make sure you discuss any questions you have with your health care provider. Document Released: 04/24/2014 Document Revised: 08/08/2017 Document Reviewed: 11/26/2015 Elsevier Patient Education  2020 Elsevier Inc.  

## 2019-02-25 NOTE — Patient Instructions (Signed)

## 2019-02-25 NOTE — Progress Notes (Signed)
[facility] Telephone:([phone]) [phone]   Fax:(336) [phone]  OFFICE PROGRESS NOTE  [name], MD [place] [address]  DIAGNOSIS: stage IV (T2 a, N3, M1 C) high-grade neuroendocrine carcinoma with focal areas of small cell carcinoma diagnosed in January 2020.  PRIOR THERAPY:  1) Carboplatin for AUC of 5 on day 1, etoposide 100 mg/M2 on days 1, 2 and 3 with Neulasta support in addition to [place].First dose February 10th, 2020. Status post 4 cycles. 2) Maintenance treatment with Tecentriq 1200 mg IV every 3 weeks.  First dose Sep 10, 2018.  Status post 3 cycles.  This was discontinued secondary to disease progression.   CURRENT THERAPY:  Systemic chemotherapy with cisplatin 30 mg/M2 and irinotecan 65 mg/M2 on days 1 and 8 every 3 weeks.  First dose October 28, 2018.  Status post 5 cycles.  INTERVAL HISTORY: Samantha Santos 58 y.o. female returns to the clinic today for follow-up visit.  The patient is feeling fine today with no concerning complaints except for mild fatigue.  She denied having any chest pain, shortness of breath, cough or hemoptysis.  She denied having any fever or chills.  She has no nausea, vomiting, diarrhea or constipation.  The patient has no headache or visual changes.  She is here today for evaluation before starting cycle #6.  She missed the Neulasta injection because of scheduling issues after the last dose.  MEDICAL HISTORY: Past Medical History:  Diagnosis Date   Anemia    Arthritis    GERD (gastroesophageal reflux disease)    Hypertension    Morbid obesity ([facility]) 05/06/2018   Supraclavicular adenopathy     ALLERGIES:  is allergic to penicillins and oxycodone.  MEDICATIONS:  Current Outpatient Medications  Medication Sig Dispense Refill   acetaminophen (TYLENOL) 500 MG tablet Take 500 mg by mouth every 6 (six) hours as needed.     gabapentin (NEURONTIN) 100 MG capsule Take 1 capsule (100 mg total) by  mouth at bedtime. 30 capsule 2   HYDROcodone-acetaminophen (NORCO) 5-325 MG tablet Take 1 tablet by mouth every 6 (six) hours as needed for severe pain. 30 tablet 0   lidocaine-prilocaine (EMLA) cream Apply 1 application topically as needed (chemo port). 30 g 0   loratadine (CLARITIN) 10 MG tablet Take 10 mg by mouth daily.     LORazepam (ATIVAN) 0.5 MG tablet Place 1 tablet (0.5 mg total) under the tongue every 6 (six) hours as needed (Nausea). 30 tablet 1   losartan-hydrochlorothiazide (HYZAAR) 100-12.5 MG tablet Take 1 tablet by mouth daily.     prochlorperazine (COMPAZINE) 10 MG tablet Take 1 tablet (10 mg total) by mouth every 6 (six) hours as needed for nausea or vomiting. 50 tablet 3   rivaroxaban (XARELTO) 20 MG TABS tablet Take 1 tablet (20 mg total) by mouth daily with supper. 30 tablet 2   diphenoxylate-atropine (LOMOTIL) 2.5-0.025 MG tablet 1 to 2 tablets 4 times daily as needed for diarrhea (Patient not taking: Reported on 01/27/2019) 40 tablet 1   traMADol (ULTRAM) 50 MG tablet Take 1 tablet (50 mg total) by mouth every 6 (six) hours as needed. (Patient not taking: Reported on 01/27/2019) 20 tablet 0   No current facility-administered medications for this visit.     SURGICAL HISTORY:  Past Surgical History:  Procedure Laterality Date   CESAREAN SECTION  1988   HYSTEROSCOPY W/D&C  06/14/2011   Procedure: DILATATION AND CURETTAGE /HYSTEROSCOPY;  Surgeon: Frederico Hamman,  MD;  Location: [facility];  Service: Gynecology;  Laterality: N/A;   IR CV LINE INJECTION  06/25/2018   PORTACATH PLACEMENT Left 06/13/2018   Procedure: INSERTION PORT-A-CATH;  Surgeon: [name], MD;  Location: [facility];  Service: Thoracic;  Laterality: Left;   SUPRACLAVICAL NODE BIOPSY Right 05/10/2018   Procedure: SUPRACLAVICAL NODE BIOPSY;  Surgeon: [name], MD;  Location: [facility];  Service: Thoracic;  Laterality: Right;   TUBAL LIGATION      REVIEW OF SYSTEMS:  A  comprehensive review of systems was negative except for: Constitutional: positive for fatigue Respiratory: positive for dyspnea on exertion   PHYSICAL EXAMINATION: General appearance: alert, cooperative and no distress Head: Normocephalic, without obvious abnormality, atraumatic Neck: no adenopathy, no JVD, supple, symmetrical, trachea midline and thyroid not enlarged, symmetric, no tenderness/mass/nodules Lymph nodes: Cervical, supraclavicular, and axillary nodes normal. Resp: clear to auscultation bilaterally Back: symmetric, no curvature. ROM normal. No CVA tenderness. Cardio: regular rate and rhythm, S1, S2 normal, no murmur, click, rub or gallop GI: soft, non-tender; bowel sounds normal; no masses,  no organomegaly Extremities: extremities normal, atraumatic, no cyanosis or edema  ECOG PERFORMANCE STATUS: 1 - Symptomatic but completely ambulatory  Blood pressure (!) 147/98, pulse 99, temperature 98.5 F (36.9 C), temperature source Temporal, resp. rate 19, height 5\' 7"  (1.702 m), weight 272 lb 9.6 oz (123.7 kg), SpO2 100 %.  LABORATORY DATA: Lab Results  Component Value Date   WBC 1.3 (L) 02/25/2019   HGB 7.3 (L) 02/25/2019   HCT 22.4 (L) 02/25/2019   MCV 87.2 02/25/2019   PLT 72 (L) 02/25/2019      Chemistry      Component Value Date/Time   NA 143 02/11/2019 0900   NA 144 04/08/2018 1145   K 3.7 02/11/2019 0900   CL 106 02/11/2019 0900   CO2 26 02/11/2019 0900   BUN 16 02/11/2019 0900   BUN 10 04/08/2018 1145   CREATININE 1.42 (H) 02/11/2019 0900      Component Value Date/Time   CALCIUM 8.9 02/11/2019 0900   ALKPHOS 91 02/11/2019 0900   AST 9 (L) 02/11/2019 0900   ALT 21 02/11/2019 0900   BILITOT 0.6 02/11/2019 0900       RADIOGRAPHIC STUDIES: Ct Chest W Contrast  Result Date: 01/28/2019 CLINICAL DATA:  Left lung cancer, chemotherapy ongoing, worsening chest and back pain EXAM: CT CHEST, ABDOMEN, AND PELVIS WITH CONTRAST TECHNIQUE: Multidetector CT imaging  of the chest, abdomen and pelvis was performed following the standard protocol during bolus administration of intravenous contrast. CONTRAST:  129mL OMNIPAQUE IOHEXOL 300 MG/ML  SOLN COMPARISON:  12/17/2018, 10/16/2018, 06/24/2018 FINDINGS: CT CHEST FINDINGS Cardiovascular: Left chest port catheter. Normal heart size. Trace pericardial effusion. Mediastinum/Nodes: Redemonstrated bulky soft tissue nodules of the anterior mediastinum and AP window, slightly increased in size compared to prior examination measuring 6.8 x 3.8 cm, previously 6.0 x 3.2 cm (series 2, image 17). No change in subcarinal and hilar lymph nodes. Interval decrease in size of a left axillary lymph node, now measuring 1.4 x 0.7 cm, previously 2.2 x 1.5 cm (series 2, image 12). Thyroid gland, trachea, and esophagus demonstrate no significant findings. Lungs/Pleura: There are new trace bilateral pleural effusions. There is a new, wedge-shaped subpleural ground-glass opacity of the superior segment right lower lobe with some suggestion of central clearing (series 4, image 51). Redemonstrated bandlike scarring of the left lower lobe. Musculoskeletal: Interval increase in size of an extrathoracic soft tissue nodule, abutting the left  second sternocostal junction and external to anterior mediastinal soft tissue. This nodule measures 2.4 x 2.0 cm, previously 1.7 x 1.6 cm when measured similarly (series 2, image 16). No change in sclerotic appearance of the manubrium. CT ABDOMEN PELVIS FINDINGS Hepatobiliary: No solid liver abnormality is seen. No gallstones, gallbladder wall thickening, or biliary dilatation. Pancreas: Unremarkable. No pancreatic ductal dilatation or surrounding inflammatory changes. Spleen: Normal in size without significant abnormality. Adrenals/Urinary Tract: Adrenal glands are unremarkable. Kidneys are normal, without renal calculi, solid lesion, or hydronephrosis. Bladder is unremarkable. Stomach/Bowel: Stomach is within normal  limits. Appendix appears normal. No evidence of bowel wall thickening, distention, or inflammatory changes. Vascular/Lymphatic: No significant vascular findings are present. No enlarged abdominal or pelvic lymph nodes. Reproductive: No mass or other abnormality. Other: No abdominal wall hernia or abnormality. No abdominopelvic ascites. Musculoskeletal: No acute or significant osseous findings. IMPRESSION: 1. There is a new, wedge-shaped subpleural ground-glass opacity of the superior segment right lower lobe with some suggestion of central clearing (series 4, image 51). This appearance is concerning for pulmonary embolus and pulmonary infarct, particularly given acute clinical presentation of chest pain. There is no direct evidence of pulmonary embolism on this examination not tailored for assessment of the pulmonary arteries. Consider CT angiogram to further evaluate. 2. Redemonstrated bulky soft tissue nodules of the anterior mediastinum and AP window, slightly increased in size compared to prior examination measuring 6.8 x 3.8 cm, previously 6.0 x 3.2 cm (series 2, image 17). 3. Interval increase in size of an extrathoracic soft tissue nodule, abutting the left second sternocostal junction and external to anterior mediastinal soft tissue. This nodule measures 2.4 x 2.0 cm, previously 1.7 x 1.6 cm when measured similarly (series 2, image 16). This may correspond to reported palpable abnormality and source of pain. No change in sclerotic appearance of the manubrium. 4. Findings above are consistent with worsening metastatic disease. 5. Decreased size of left axillary lymph node, of uncertain significance, possibly reflecting mixed response to treatment. 6. No evidence of metastatic disease in the abdomen or pelvis. 7. New trace bilateral pleural effusions. These results will be called to the ordering clinician or representative by the Radiologist Assistant, and communication documented in the PACS or zVision  Dashboard. Electronically Signed   By: Eddie Candle M.D.   On: 01/28/2019 12:24   Ct Abdomen Pelvis W Contrast  Result Date: 01/28/2019 CLINICAL DATA:  Left lung cancer, chemotherapy ongoing, worsening chest and back pain EXAM: CT CHEST, ABDOMEN, AND PELVIS WITH CONTRAST TECHNIQUE: Multidetector CT imaging of the chest, abdomen and pelvis was performed following the standard protocol during bolus administration of intravenous contrast. CONTRAST:  119mL OMNIPAQUE IOHEXOL 300 MG/ML  SOLN COMPARISON:  12/17/2018, 10/16/2018, 06/24/2018 FINDINGS: CT CHEST FINDINGS Cardiovascular: Left chest port catheter. Normal heart size. Trace pericardial effusion. Mediastinum/Nodes: Redemonstrated bulky soft tissue nodules of the anterior mediastinum and AP window, slightly increased in size compared to prior examination measuring 6.8 x 3.8 cm, previously 6.0 x 3.2 cm (series 2, image 17). No change in subcarinal and hilar lymph nodes. Interval decrease in size of a left axillary lymph node, now measuring 1.4 x 0.7 cm, previously 2.2 x 1.5 cm (series 2, image 12). Thyroid gland, trachea, and esophagus demonstrate no significant findings. Lungs/Pleura: There are new trace bilateral pleural effusions. There is a new, wedge-shaped subpleural ground-glass opacity of the superior segment right lower lobe with some suggestion of central clearing (series 4, image 51). Redemonstrated bandlike scarring of the left lower lobe. Musculoskeletal:  Interval increase in size of an extrathoracic soft tissue nodule, abutting the left second sternocostal junction and external to anterior mediastinal soft tissue. This nodule measures 2.4 x 2.0 cm, previously 1.7 x 1.6 cm when measured similarly (series 2, image 16). No change in sclerotic appearance of the manubrium. CT ABDOMEN PELVIS FINDINGS Hepatobiliary: No solid liver abnormality is seen. No gallstones, gallbladder wall thickening, or biliary dilatation. Pancreas: Unremarkable. No pancreatic  ductal dilatation or surrounding inflammatory changes. Spleen: Normal in size without significant abnormality. Adrenals/Urinary Tract: Adrenal glands are unremarkable. Kidneys are normal, without renal calculi, solid lesion, or hydronephrosis. Bladder is unremarkable. Stomach/Bowel: Stomach is within normal limits. Appendix appears normal. No evidence of bowel wall thickening, distention, or inflammatory changes. Vascular/Lymphatic: No significant vascular findings are present. No enlarged abdominal or pelvic lymph nodes. Reproductive: No mass or other abnormality. Other: No abdominal wall hernia or abnormality. No abdominopelvic ascites. Musculoskeletal: No acute or significant osseous findings. IMPRESSION: 1. There is a new, wedge-shaped subpleural ground-glass opacity of the superior segment right lower lobe with some suggestion of central clearing (series 4, image 51). This appearance is concerning for pulmonary embolus and pulmonary infarct, particularly given acute clinical presentation of chest pain. There is no direct evidence of pulmonary embolism on this examination not tailored for assessment of the pulmonary arteries. Consider CT angiogram to further evaluate. 2. Redemonstrated bulky soft tissue nodules of the anterior mediastinum and AP window, slightly increased in size compared to prior examination measuring 6.8 x 3.8 cm, previously 6.0 x 3.2 cm (series 2, image 17). 3. Interval increase in size of an extrathoracic soft tissue nodule, abutting the left second sternocostal junction and external to anterior mediastinal soft tissue. This nodule measures 2.4 x 2.0 cm, previously 1.7 x 1.6 cm when measured similarly (series 2, image 16). This may correspond to reported palpable abnormality and source of pain. No change in sclerotic appearance of the manubrium. 4. Findings above are consistent with worsening metastatic disease. 5. Decreased size of left axillary lymph node, of uncertain significance,  possibly reflecting mixed response to treatment. 6. No evidence of metastatic disease in the abdomen or pelvis. 7. New trace bilateral pleural effusions. These results will be called to the ordering clinician or representative by the Radiologist Assistant, and communication documented in the PACS or zVision Dashboard. Electronically Signed   By: Eddie Candle M.D.   On: 01/28/2019 12:24    ASSESSMENT AND PLAN: This is a very pleasant 58 years old African-American female with stage IV high-grade neuroendocrine carcinoma and underwent systemic chemotherapy with carboplatin and etoposide and Tecentriq status post 7 cycles. Starting from cycle #5 the patient has been on treatment with maintenance Tecentriq status post 3 cycles. This treatment was discontinued secondary to disease progression. The patient is currently on treatment with systemic chemotherapy with cisplatin 30 mg/M2 and irinotecan 65 mg/M2 on days 1 and 8 every 3 weeks status post 5 cycles. She has been tolerating the treatment well except for fatigue and shortness of breath with exertion. Her absolute neutrophil count as well as platelets count are low today.  We will delay the start of cycle number 6 by 1 week to start next week. The patient will come back for follow-up visit in 1 week for evaluation before starting her treatment. For the chemotherapy-induced anemia, we will arrange for the patient to receive 2 units of PRBCs transfusion later this week. For the hypomagnesemia, we will monitor her magnesium level and replace it as needed. The patient was  advised to call immediately if she has any concerning symptoms in the interval. All questions were answered. The patient knows to call the clinic with any problems, questions or concerns. We can certainly see the patient much sooner if necessary.  Disclaimer: This note was dictated with voice recognition software. Similar sounding words can inadvertently be transcribed and may not be  corrected upon review.

## 2019-02-26 ENCOUNTER — Other Ambulatory Visit: Payer: Self-pay

## 2019-02-26 ENCOUNTER — Ambulatory Visit
Admission: RE | Admit: 2019-02-26 | Discharge: 2019-02-26 | Disposition: A | Discharge status: Home / Self Care | Payer: Medicaid Other | Source: Ambulatory Visit | Attending: Radiation Oncology | Admitting: Radiation Oncology

## 2019-02-26 ENCOUNTER — Encounter: Payer: Self-pay | Admitting: Radiation Oncology

## 2019-02-26 DIAGNOSIS — Z510 Encounter for antineoplastic radiation therapy: Secondary | ICD-10-CM | POA: Diagnosis not present

## 2019-02-26 LAB — TYPE AND SCREEN
ABO/RH(D): O POS
Antibody Screen: NEGATIVE
Unit division: 0
Unit division: 0

## 2019-02-26 LAB — BPAM RBC
Blood Product Expiration Date: 202012062359
Blood Product Expiration Date: 202012062359
ISSUE DATE / TIME: 202011101136
ISSUE DATE / TIME: 202011101136
Unit Type and Rh: 5100
Unit Type and Rh: 5100

## 2019-02-27 ENCOUNTER — Ambulatory Visit: Payer: Medicaid Other

## 2019-03-03 ENCOUNTER — Telehealth: Payer: Self-pay | Admitting: Physician Assistant

## 2019-03-03 ENCOUNTER — Other Ambulatory Visit: Payer: Self-pay

## 2019-03-03 ENCOUNTER — Encounter: Payer: Self-pay | Admitting: Physician Assistant

## 2019-03-03 ENCOUNTER — Inpatient Hospital Stay: Payer: Medicaid Other

## 2019-03-03 ENCOUNTER — Inpatient Hospital Stay (HOSPITAL_BASED_OUTPATIENT_CLINIC_OR_DEPARTMENT_OTHER): Payer: Medicaid Other | Admitting: Physician Assistant

## 2019-03-03 VITALS — BP 141/99 | HR 86 | Temp 98.5°F | Resp 18 | Ht 67.0 in | Wt 270.0 lb

## 2019-03-03 DIAGNOSIS — C3490 Malignant neoplasm of unspecified part of unspecified bronchus or lung: Secondary | ICD-10-CM | POA: Diagnosis not present

## 2019-03-03 DIAGNOSIS — Z95828 Presence of other vascular implants and grafts: Secondary | ICD-10-CM

## 2019-03-03 DIAGNOSIS — C3412 Malignant neoplasm of upper lobe, left bronchus or lung: Secondary | ICD-10-CM

## 2019-03-03 DIAGNOSIS — D6959 Other secondary thrombocytopenia: Secondary | ICD-10-CM | POA: Diagnosis not present

## 2019-03-03 DIAGNOSIS — D701 Agranulocytosis secondary to cancer chemotherapy: Secondary | ICD-10-CM | POA: Diagnosis not present

## 2019-03-03 DIAGNOSIS — D696 Thrombocytopenia, unspecified: Secondary | ICD-10-CM

## 2019-03-03 DIAGNOSIS — T451X5A Adverse effect of antineoplastic and immunosuppressive drugs, initial encounter: Secondary | ICD-10-CM | POA: Insufficient documentation

## 2019-03-03 LAB — CMP (CANCER CENTER ONLY)
ALT: 8 U/L (ref 0–44)
AST: <6 U/L — ABNORMAL LOW (ref 15–41)
Albumin: 3.3 g/dL — ABNORMAL LOW (ref 3.5–5.0)
Alkaline Phosphatase: 86 U/L (ref 38–126)
Anion gap: 10 (ref 5–15)
BUN: 13 mg/dL (ref 6–20)
CO2: 28 mmol/L (ref 22–32)
Calcium: 8.2 mg/dL — ABNORMAL LOW (ref 8.9–10.3)
Chloride: 104 mmol/L (ref 98–111)
Creatinine: 1.4 mg/dL — ABNORMAL HIGH (ref 0.44–1.00)
GFR, Est AFR Am: 48 mL/min — ABNORMAL LOW (ref >60)
GFR, Estimated: 41 mL/min — ABNORMAL LOW (ref >60)
Glucose, Bld: 96 mg/dL (ref 70–99)
Potassium: 3.4 mmol/L — ABNORMAL LOW (ref 3.5–5.1)
Sodium: 142 mmol/L (ref 135–145)
Total Bilirubin: 1.3 mg/dL — ABNORMAL HIGH (ref 0.3–1.2)
Total Protein: 6.9 g/dL (ref 6.5–8.1)

## 2019-03-03 LAB — CBC WITH DIFFERENTIAL (CANCER CENTER ONLY)
Abs Immature Granulocytes: 0 10*3/uL (ref 0.00–0.07)
Basophils Absolute: 0 10*3/uL (ref 0.0–0.1)
Basophils Relative: 0 %
Eosinophils Absolute: 0 10*3/uL (ref 0.0–0.5)
Eosinophils Relative: 1 %
HCT: 25.7 % — ABNORMAL LOW (ref 36.0–46.0)
Hemoglobin: 8.6 g/dL — ABNORMAL LOW (ref 12.0–15.0)
Immature Granulocytes: 0 %
Lymphocytes Relative: 37 %
Lymphs Abs: 0.7 10*3/uL (ref 0.7–4.0)
MCH: 27.8 pg (ref 26.0–34.0)
MCHC: 33.5 g/dL (ref 30.0–36.0)
MCV: 83.2 fL (ref 80.0–100.0)
Monocytes Absolute: 0.2 10*3/uL (ref 0.1–1.0)
Monocytes Relative: 10 %
Neutro Abs: 1 10*3/uL — ABNORMAL LOW (ref 1.7–7.7)
Neutrophils Relative %: 52 %
Platelet Count: 40 10*3/uL — ABNORMAL LOW (ref 150–400)
RBC: 3.09 MIL/uL — ABNORMAL LOW (ref 3.87–5.11)
RDW: 17.9 % — ABNORMAL HIGH (ref 11.5–15.5)
WBC Count: 1.9 10*3/uL — ABNORMAL LOW (ref 4.0–10.5)
nRBC: 0 % (ref 0.0–0.2)

## 2019-03-03 MED ORDER — TBO-FILGRASTIM 480 MCG/0.8ML ~~LOC~~ SOSY
480.0000 ug | PREFILLED_SYRINGE | Freq: Once | SUBCUTANEOUS | Status: AC
  Administered 2019-03-03: 480 ug via SUBCUTANEOUS

## 2019-03-03 MED ORDER — RIVAROXABAN 20 MG PO TABS: 20.0000 mg | ORAL_TABLET | Freq: Every day | ORAL | 2 refills | Status: AC

## 2019-03-03 MED ORDER — METHYLPREDNISOLONE 4 MG PO TBPK: ORAL_TABLET | ORAL | 0 refills | Status: DC

## 2019-03-03 MED ORDER — TBO-FILGRASTIM 480 MCG/0.8ML ~~LOC~~ SOSY
PREFILLED_SYRINGE | SUBCUTANEOUS | Status: AC
  Filled 2019-03-03: qty 0.8

## 2019-03-03 NOTE — Patient Instructions (Signed)
Tbo-Filgrastim injection What is this medicine? TBO-FILGRASTIM (T B O fil GRA stim) is a granulocyte colony-stimulating factor that helps you make more neutrophils, a type of white blood cell. Neutrophils are important for fighting infections. Some chemotherapy affects your bone marrow and lowers your neutrophils. This medicine helps decrease the length of time that neutrophils are very low (severe neutropenia). This medicine may be used for other purposes; ask your health care provider or pharmacist if you have questions. COMMON BRAND NAME(S): Granix What should I tell my health care provider before I take this medicine? They need to know if you have any of these conditions:  bone scan or tests planned  kidney disease  sickle cell anemia  an unusual or allergic reaction to tbo-filgrastim, filgrastim, pegfilgrastim, other medicines, foods, dyes, or preservatives  pregnant or trying to get pregnant  breast-feeding How should I use this medicine? This medicine is for injection under the skin. If you get this medicine at home, you will be taught how to prepare and give this medicine. Refer to the Instructions for Use that come with your medication packaging. Use exactly as directed. Take your medicine at regular intervals. Do not take your medicine more often than directed. It is important that you put your used needles and syringes in a special sharps container. Do not put them in a trash can. If you do not have a sharps container, call your pharmacist or healthcare provider to get one. Talk to your pediatrician regarding the use of this medicine in children. While this drug may be prescribed for children as young as 39 month of age for selected conditions, precautions do apply. Overdosage: If you think you have taken too much of this medicine contact a poison control center or emergency room at once. NOTE: This medicine is only for you. Do not share this medicine with others. What if I miss a  dose? It is important not to miss your dose. Call your doctor or health care professional if you miss a dose. What may interact with this medicine? This medicine may interact with the following medications:  medicines that may cause a release of neutrophils, such as lithium This list may not describe all possible interactions. Give your health care provider a list of all the medicines, herbs, non-prescription drugs, or dietary supplements you use. Also tell them if you smoke, drink alcohol, or use illegal drugs. Some items may interact with your medicine. What should I watch for while using this medicine? You may need blood work done while you are taking this medicine. What side effects may I notice from receiving this medicine? Side effects that you should report to your doctor or health care professional as soon as possible:  allergic reactions like skin rash, itching or hives, swelling of the face, lips, or tongue  back pain  blood in the urine  dark urine  dizziness  fast heartbeat  feeling faint  shortness of breath or breathing problems  signs and symptoms of infection like fever or chills; cough; or sore throat  signs and symptoms of kidney injury like trouble passing urine or change in the amount of urine  stomach or side pain, or pain at the shoulder  sweating  swelling of the legs, ankles, or abdomen  tiredness Side effects that usually do not require medical attention (report to your doctor or health care professional if they continue or are bothersome):  bone pain  diarrhea  headache  muscle pain  vomiting This list may  not describe all possible side effects. Call your doctor for medical advice about side effects. You may report side effects to FDA at 1-800-FDA-1088. Where should I keep my medicine? Keep out of the reach of children. Store in a refrigerator between 2 and 8 degrees C (36 and 46 degrees F). Keep in carton to protect from light. Throw  away this medicine if it is left out of the refrigerator for more than 5 consecutive days. Throw away any unused medicine after the expiration date. NOTE: This sheet is a summary. It may not cover all possible information. If you have questions about this medicine, talk to your doctor, pharmacist, or health care provider.  2020 Elsevier/Gold Standard (2018-02-02 19:58:39)

## 2019-03-03 NOTE — Progress Notes (Signed)
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  PRIOR THERAPY:  1) Carboplatin for AUC of 5 on day 1, etoposide 100 mg/M2 on days 1, 2 and 3 with Neulasta support in addition to [place].First dose February 10th, 2020. Status post 4 cycles. 2) Maintenance treatment with Tecentriq 1200 mg IV every 3 weeks.  First dose Sep 10, 2018.  Status post 3 cycles.  This was discontinued secondary to disease progression.  CURRENT THERAPY: Systemic chemotherapy with cisplatin 30 mg/M2 and irinotecan 65 mg/M2 on days 1 and 8 every 3 weeks. Her dose was reduced to cisplatin 25 mg/m2 and Irinotecan 50 mg/m2 starting from cycle #2 with Udenyca support. First dose October 28, 2018.  Status post 5 cycles.  INTERVAL HISTORY: Samantha Santos 58 y.o. female returns to the clinic for a follow-up visit accompanied by her daughter. The patient is feeling fairly well today without any concerning complaints.  Cycle #6 of her chemotherapy was supposed to be given last week; however, she was unable to receive her treatment secondary to neutropenia and thrombocytopenia.  Her treatment was delayed by 1 week to today.   Today, the patient is feeling well except the plantar aspect of her foot is a little tender this morning. She has had this on and off in the past. She states that she has not been able to get an appointment with her primary provider for several months and would be interested in being referred to a different clinic nearby. The patient denies any recent fever, chills, night sweats, or weight loss. She does report a decreased appetite. She denies any chest pain, shortness of breath, cough, or hemoptysis. She had received palliative radiotherapy that was recently completed. She denies any nausea, vomiting, diarrhea, or  constipation.  She denies any current headaches or visual changes.  She is here today for evaluation before starting cycle #6.  MEDICAL HISTORY: Past Medical History:  Diagnosis Date  . Anemia   . Arthritis   . GERD (gastroesophageal reflux disease)   . Hypertension   . Morbid obesity ([place]) 05/06/2018  . Supraclavicular adenopathy     ALLERGIES:  is allergic to penicillins and oxycodone.  MEDICATIONS:  Current Outpatient Medications  Medication Sig Dispense Refill  . acetaminophen (TYLENOL) 500 MG tablet Take 500 mg by mouth every 6 (six) hours as needed.    . diphenoxylate-atropine (LOMOTIL) 2.5-0.025 MG tablet 1 to 2 tablets 4 times daily as needed for diarrhea 40 tablet 1  . gabapentin (NEURONTIN) 100 MG capsule Take 1 capsule (100 mg total) by mouth at bedtime. 30 capsule 2  . HYDROcodone-acetaminophen (NORCO) 5-325 MG tablet Take 1 tablet by mouth every 6 (six) hours as needed for severe pain. 30 tablet 0  . lidocaine-prilocaine (EMLA) cream Apply 1 application topically as needed (chemo port). 30 g 0  . loratadine (CLARITIN) 10 MG tablet Take 10 mg by mouth daily.    Marland Kitchen LORazepam (ATIVAN) 0.5 MG tablet Place 1 tablet (0.5 mg total) under the tongue every 6 (six) hours as needed (Nausea). 30 tablet 1  . losartan-hydrochlorothiazide (HYZAAR) 100-12.5 MG tablet Take 1 tablet by mouth daily.    . methylPREDNISolone (MEDROL DOSEPAK) 4 MG TBPK tablet Use as instructed 21 tablet 0  . prochlorperazine (COMPAZINE) 10 MG tablet Take 1 tablet (10 mg total)  by mouth every 6 (six) hours as needed for nausea or vomiting. 50 tablet 3  . rivaroxaban (XARELTO) 20 MG TABS tablet Take 1 tablet (20 mg total) by mouth daily with supper. 30 tablet 2  . traMADol (ULTRAM) 50 MG tablet Take 1 tablet (50 mg total) by mouth every 6 (six) hours as needed. 20 tablet 0   No current facility-administered medications for this visit.     SURGICAL HISTORY:  Past Surgical History:  Procedure Laterality Date  .  CESAREAN SECTION  1988  . HYSTEROSCOPY W/D&C  06/14/2011   Procedure: DILATATION AND CURETTAGE /HYSTEROSCOPY;  Surgeon: Frederico Hamman, MD;  Location: [facility];  Service: Gynecology;  Laterality: N/A;  . IR CV LINE INJECTION  06/25/2018  . PORTACATH PLACEMENT Left 06/13/2018   Procedure: INSERTION PORT-A-CATH;  Surgeon: [name], MD;  Location: [facility];  Service: Thoracic;  Laterality: Left;  . SUPRACLAVICAL NODE BIOPSY Right 05/10/2018   Procedure: SUPRACLAVICAL NODE BIOPSY;  Surgeon: [name], MD;  Location: [facility];  Service: Thoracic;  Laterality: Right;  . TUBAL LIGATION      REVIEW OF SYSTEMS:   Review of Systems  Constitutional: Positive for decreased appetite. Negative for chills, fatigue, fever and unexpected weight change.  HENT: Negative for mouth sores, nosebleeds, sore throat and trouble swallowing.   Eyes: Negative for eye problems and icterus.  Respiratory: Negative for cough, hemoptysis, shortness of breath and wheezing.   Cardiovascular: Negative for chest pain and leg swelling.  Gastrointestinal: Negative for abdominal pain, constipation, diarrhea, nausea and vomiting.  Genitourinary: Negative for bladder incontinence, difficulty urinating, dysuria, frequency and hematuria.   Musculoskeletal: Tenderness on the plantar aspect of her left foot. No erythema, swelling, wounds, or drainage. Negative for back pain, gait problem, neck pain and neck stiffness.  Skin: Negative for itching and rash.  Neurological: Negative for dizziness, extremity weakness, gait problem, headaches, light-headedness and seizures.  Hematological: Negative for adenopathy. Does not bruise/bleed easily.  Psychiatric/Behavioral: Negative for confusion, depression and sleep disturbance. The patient is not nervous/anxious.     PHYSICAL EXAMINATION:  Blood pressure (!) 141/99, pulse 86, temperature 98.5 F (36.9 C), temperature source Temporal, resp. rate 18, height 5\' 7"  (1.702 m),  weight 270 lb (122.5 kg), SpO2 100 %.  ECOG PERFORMANCE STATUS: 1 - Symptomatic but completely ambulatory  Physical Exam  Constitutional: Oriented to person, place, and time and well-developed, well-nourished, and in no distress.  HENT:  Head: Normocephalic and atraumatic.  Mouth/Throat: Oropharynx is clear and moist. No oropharyngeal exudate.  Eyes: Conjunctivae are normal. Right eye exhibits no discharge. Left eye exhibits no discharge. No scleral icterus.  Neck: Normal range of motion. Neck supple.  Cardiovascular: Normal rate, regular rhythm, normal heart sounds and intact distal pulses.   Pulmonary/Chest: Effort normal. Decreased breath sounds in the left lower lobe. No respiratory distress. No wheezes. No rales.  Abdominal: Soft. Bowel sounds are normal. Exhibits no distension and no mass. There is no tenderness.  Musculoskeletal: Tender knot in the plantar aspect of her left foot. No overlying skin changes, swelling, erythema, or wounds. Normal range of motion. Exhibits no edema.  Lymphadenopathy:    No cervical adenopathy.  Neurological: Alert and oriented to person, place, and time. Exhibits normal muscle tone. Gait normal. Coordination normal.  Skin: Skin is warm and dry. No rash noted. Not diaphoretic. No erythema. No pallor.  Psychiatric: Mood, memory and judgment normal.  Vitals reviewed.  LABORATORY DATA: Lab Results  Component Value Date  WBC 1.9 (L) 03/03/2019   HGB 8.6 (L) 03/03/2019   HCT 25.7 (L) 03/03/2019   MCV 83.2 03/03/2019   PLT 40 (L) 03/03/2019      Chemistry      Component Value Date/Time   NA 142 03/03/2019 1056   NA 144 04/08/2018 1145   K 3.4 (L) 03/03/2019 1056   CL 104 03/03/2019 1056   CO2 28 03/03/2019 1056   BUN 13 03/03/2019 1056   BUN 10 04/08/2018 1145   CREATININE 1.40 (H) 03/03/2019 1056      Component Value Date/Time   CALCIUM 8.2 (L) 03/03/2019 1056   ALKPHOS 86 03/03/2019 1056   AST <6 (L) 03/03/2019 1056   ALT 8 03/03/2019  1056   BILITOT 1.3 (H) 03/03/2019 1056       RADIOGRAPHIC STUDIES:  No results found.   ASSESSMENT/PLAN:  This isa very pleasant 58 year old African-American female with stage IV (T2a,N3, M1C)high-grade neuroendocrine carcinoma with focal areas of small cell lung cancer. She was diagnosed in January 2020.  Shepreviously underwenttreatment with carboplatin for an AUC of 5 on day 1, etoposide 100 mg/m on days 1, 2, and 3 with Tecentriq and Neulasta support. She is status post7cycles. Starting from cycle #5, the patient had been on maintenance tecentriq. This was discontinued due to evidence of disease progression.   She is currently undergoing systemic chemotherapy withcisplatin 30 mg/m andirinotecan65 mg/m on days 1 and 8 every 3 weeks.She is status post5cycles. Her dose of cisplatin was decreased to 25 mg/m2 of Cisplatin and 50 mg/m2 of irinotecan starting from cycle #2 with Udenyca support.   The patient was seen with Dr. Julien Nordmann today. Labs were reviewed. Her labs continue to show thrombocytopenia and neutropenia. We will delay her treatment by 1 week until there is improvement in her blood counts.   For the neutropenia, she will receive 3 daily granix injections starting today.   I have sent a medrol dosepak to her pharmacy for the thrombocytopenia. In the meantime, she was instructed to avoid OTC medications like NSAIDs or herbal supplements due to her platelet count.   We will see her back for a follow up visit in 1 weeks for evaluation before starting cycle #6.   I have sent a refill for her Xarelto to her pharmacy.   I send a referral to the internal medicine residency clinic to establish care at the patient's request.   The patient was advised to call immediately if she has any concerning symptoms in the interval. The patient voices understanding of current disease status and treatment options and is in agreement with the current care plan. All questions were  answered. The patient knows to call the clinic with any problems, questions or concerns. We can certainly see the patient much sooner if necessary   Orders Placed This Encounter  Procedures  . CBC with Differential (Cancer Center Only)    Standing Status:   Standing    Number of Occurrences:   8    Standing Expiration Date:   03/02/2020  . Magnesium    Standing Status:   Standing    Number of Occurrences:   8    Standing Expiration Date:   03/02/2020  . Ambulatory referral to Internal Medicine    Referral Priority:   Routine    Referral Type:   Consultation    Referral Reason:   Specialty Services Required    Requested Specialty:   Internal Medicine    Number of Visits Requested:  [place], PA-C 03/03/19  ADDENDUM: Hematology/Oncology Attending: I had a face-to-face encounter with the patient today.  I recommended her care plan.  This is a very pleasant 58 years old African-American female with high-grade neuroendocrine carcinoma status post induction systemic chemotherapy with carboplatin and 2 etoposide with partial response followed by disease progression. The patient is currently undergoing systemic chemotherapy with reduced dose cisplatin and irinotecan.  She has been tolerating the treatment well but continues to have pancytopenia with significant neutropenia and thrombocytopenia. I recommended for the patient to hold her treatment for now until improvement of her platelets and white blood count. For the chemotherapy-induced neutropenia, we will arrange for the patient to receive Granix 480 mcg subcutaneously daily for the next 3 days. For the thrombocytopenia, we will start her on a Medrol Dosepak with the hope to improve her count by the next visit next week. The patient will come back for follow-up visit in 1 week for reevaluation before resuming her treatment. She was advised to call immediately if she has any concerning symptoms in the  interval.  Disclaimer: This note was dictated with voice recognition software. Similar sounding words can inadvertently be transcribed and may be missed upon review. Eilleen Kempf, MD 03/03/19

## 2019-03-03 NOTE — Telephone Encounter (Signed)
Scheduled appt per 11/16 los.  Spoke with pt and she is aware of her appt dates and time.

## 2019-03-04 ENCOUNTER — Inpatient Hospital Stay: Payer: Medicaid Other

## 2019-03-04 ENCOUNTER — Other Ambulatory Visit: Payer: Medicaid Other

## 2019-03-04 ENCOUNTER — Other Ambulatory Visit: Payer: Self-pay

## 2019-03-04 VITALS — BP 125/94 | HR 89 | Temp 98.5°F | Resp 20

## 2019-03-04 DIAGNOSIS — C3490 Malignant neoplasm of unspecified part of unspecified bronchus or lung: Secondary | ICD-10-CM | POA: Diagnosis not present

## 2019-03-04 DIAGNOSIS — Z95828 Presence of other vascular implants and grafts: Secondary | ICD-10-CM

## 2019-03-04 MED ORDER — TBO-FILGRASTIM 480 MCG/0.8ML ~~LOC~~ SOSY
480.0000 ug | PREFILLED_SYRINGE | Freq: Once | SUBCUTANEOUS | Status: AC
  Administered 2019-03-04: 11:00:00 480 ug via SUBCUTANEOUS

## 2019-03-04 MED ORDER — TBO-FILGRASTIM 480 MCG/0.8ML ~~LOC~~ SOSY
PREFILLED_SYRINGE | SUBCUTANEOUS | Status: AC
  Filled 2019-03-04: qty 0.8

## 2019-03-04 NOTE — Patient Instructions (Signed)
Tbo-Filgrastim injection What is this medicine? TBO-FILGRASTIM (T B O fil GRA stim) is a granulocyte colony-stimulating factor that helps you make more neutrophils, a type of white blood cell. Neutrophils are important for fighting infections. Some chemotherapy affects your bone marrow and lowers your neutrophils. This medicine helps decrease the length of time that neutrophils are very low (severe neutropenia). This medicine may be used for other purposes; ask your health care provider or pharmacist if you have questions. COMMON BRAND NAME(S): Granix What should I tell my health care provider before I take this medicine? They need to know if you have any of these conditions:  bone scan or tests planned  kidney disease  sickle cell anemia  an unusual or allergic reaction to tbo-filgrastim, filgrastim, pegfilgrastim, other medicines, foods, dyes, or preservatives  pregnant or trying to get pregnant  breast-feeding How should I use this medicine? This medicine is for injection under the skin. If you get this medicine at home, you will be taught how to prepare and give this medicine. Refer to the Instructions for Use that come with your medication packaging. Use exactly as directed. Take your medicine at regular intervals. Do not take your medicine more often than directed. It is important that you put your used needles and syringes in a special sharps container. Do not put them in a trash can. If you do not have a sharps container, call your pharmacist or healthcare provider to get one. Talk to your pediatrician regarding the use of this medicine in children. While this drug may be prescribed for children as young as 68 month of age for selected conditions, precautions do apply. Overdosage: If you think you have taken too much of this medicine contact a poison control center or emergency room at once. NOTE: This medicine is only for you. Do not share this medicine with others. What if I miss a  dose? It is important not to miss your dose. Call your doctor or health care professional if you miss a dose. What may interact with this medicine? This medicine may interact with the following medications:  medicines that may cause a release of neutrophils, such as lithium This list may not describe all possible interactions. Give your health care provider a list of all the medicines, herbs, non-prescription drugs, or dietary supplements you use. Also tell them if you smoke, drink alcohol, or use illegal drugs. Some items may interact with your medicine. What should I watch for while using this medicine? You may need blood work done while you are taking this medicine. What side effects may I notice from receiving this medicine? Side effects that you should report to your doctor or health care professional as soon as possible:  allergic reactions like skin rash, itching or hives, swelling of the face, lips, or tongue  back pain  blood in the urine  dark urine  dizziness  fast heartbeat  feeling faint  shortness of breath or breathing problems  signs and symptoms of infection like fever or chills; cough; or sore throat  signs and symptoms of kidney injury like trouble passing urine or change in the amount of urine  stomach or side pain, or pain at the shoulder  sweating  swelling of the legs, ankles, or abdomen  tiredness Side effects that usually do not require medical attention (report to your doctor or health care professional if they continue or are bothersome):  bone pain  diarrhea  headache  muscle pain  vomiting This list may  not describe all possible side effects. Call your doctor for medical advice about side effects. You may report side effects to FDA at 1-800-FDA-1088. Where should I keep my medicine? Keep out of the reach of children. Store in a refrigerator between 2 and 8 degrees C (36 and 46 degrees F). Keep in carton to protect from light. Throw  away this medicine if it is left out of the refrigerator for more than 5 consecutive days. Throw away any unused medicine after the expiration date. NOTE: This sheet is a summary. It may not cover all possible information. If you have questions about this medicine, talk to your doctor, pharmacist, or health care provider.  2020 Elsevier/Gold Standard (2018-02-02 19:58:39)

## 2019-03-05 ENCOUNTER — Other Ambulatory Visit: Payer: Self-pay

## 2019-03-05 ENCOUNTER — Ambulatory Visit: Payer: Medicaid Other

## 2019-03-05 ENCOUNTER — Inpatient Hospital Stay: Payer: Medicaid Other

## 2019-03-05 VITALS — BP 136/92 | HR 85 | Temp 98.0°F | Resp 18

## 2019-03-05 DIAGNOSIS — C3490 Malignant neoplasm of unspecified part of unspecified bronchus or lung: Secondary | ICD-10-CM | POA: Diagnosis not present

## 2019-03-05 DIAGNOSIS — Z95828 Presence of other vascular implants and grafts: Secondary | ICD-10-CM

## 2019-03-05 MED ORDER — TBO-FILGRASTIM 480 MCG/0.8ML ~~LOC~~ SOSY
480.0000 ug | PREFILLED_SYRINGE | Freq: Once | SUBCUTANEOUS | Status: AC
  Administered 2019-03-05: 480 ug via SUBCUTANEOUS

## 2019-03-05 NOTE — Progress Notes (Incomplete)
°  Patient Name: Samantha Santos MRN: [id number] DOB: [birth year] Referring Physician: Roosevelt Locks Date of Service: 02/26/2019 [facility], [place]                                                        End Of Treatment Note  Diagnoses: C34.12-Malignant neoplasm of upper lobe, left bronchus or lung  Cancer Staging: Stage IV (T2a, N3, M1c) high-grade neuroendocrine carcinoma with focal areas of small cell carcinoma  Intent: Palliative  Radiation Treatment Dates: 02/13/2019 through 02/26/2019 Site Technique Total Dose (Gy) Dose per Fx (Gy) Completed Fx Beam Energies  Thorax: Chest 3D 30/30 3 10/10 6X, 10X, 15X   Narrative: The patient tolerated radiation therapy relatively well. On 02/24/2019, patient reported some mild fatigue and shortness of breath with exertion. No other complaints. No significant exam findings throughout.  Plan: The patient will follow-up with radiation oncology in one month.  ________________________________________________   Blair Promise, PhD, MD  This document serves as a record of services personally performed by Gery Pray, MD. It was created on his behalf by Clerance Lav, a trained medical scribe. The creation of this record is based on the scribe's personal observations and the provider's statements to them. This document has been checked and approved by the attending provider.

## 2019-03-05 NOTE — Patient Instructions (Signed)
Tbo-Filgrastim injection What is this medicine? TBO-FILGRASTIM (T B O fil GRA stim) is a granulocyte colony-stimulating factor that helps you make more neutrophils, a type of white blood cell. Neutrophils are important for fighting infections. Some chemotherapy affects your bone marrow and lowers your neutrophils. This medicine helps decrease the length of time that neutrophils are very low (severe neutropenia). This medicine may be used for other purposes; ask your health care provider or pharmacist if you have questions. COMMON BRAND NAME(S): Granix What should I tell my health care provider before I take this medicine? They need to know if you have any of these conditions:  bone scan or tests planned  kidney disease  sickle cell anemia  an unusual or allergic reaction to tbo-filgrastim, filgrastim, pegfilgrastim, other medicines, foods, dyes, or preservatives  pregnant or trying to get pregnant  breast-feeding How should I use this medicine? This medicine is for injection under the skin. If you get this medicine at home, you will be taught how to prepare and give this medicine. Refer to the Instructions for Use that come with your medication packaging. Use exactly as directed. Take your medicine at regular intervals. Do not take your medicine more often than directed. It is important that you put your used needles and syringes in a special sharps container. Do not put them in a trash can. If you do not have a sharps container, call your pharmacist or healthcare provider to get one. Talk to your pediatrician regarding the use of this medicine in children. While this drug may be prescribed for children as young as 24 month of age for selected conditions, precautions do apply. Overdosage: If you think you have taken too much of this medicine contact a poison control center or emergency room at once. NOTE: This medicine is only for you. Do not share this medicine with others. What if I miss a  dose? It is important not to miss your dose. Call your doctor or health care professional if you miss a dose. What may interact with this medicine? This medicine may interact with the following medications:  medicines that may cause a release of neutrophils, such as lithium This list may not describe all possible interactions. Give your health care provider a list of all the medicines, herbs, non-prescription drugs, or dietary supplements you use. Also tell them if you smoke, drink alcohol, or use illegal drugs. Some items may interact with your medicine. What should I watch for while using this medicine? You may need blood work done while you are taking this medicine. What side effects may I notice from receiving this medicine? Side effects that you should report to your doctor or health care professional as soon as possible:  allergic reactions like skin rash, itching or hives, swelling of the face, lips, or tongue  back pain  blood in the urine  dark urine  dizziness  fast heartbeat  feeling faint  shortness of breath or breathing problems  signs and symptoms of infection like fever or chills; cough; or sore throat  signs and symptoms of kidney injury like trouble passing urine or change in the amount of urine  stomach or side pain, or pain at the shoulder  sweating  swelling of the legs, ankles, or abdomen  tiredness Side effects that usually do not require medical attention (report to your doctor or health care professional if they continue or are bothersome):  bone pain  diarrhea  headache  muscle pain  vomiting This list may  not describe all possible side effects. Call your doctor for medical advice about side effects. You may report side effects to FDA at 1-800-FDA-1088. Where should I keep my medicine? Keep out of the reach of children. Store in a refrigerator between 2 and 8 degrees C (36 and 46 degrees F). Keep in carton to protect from light. Throw  away this medicine if it is left out of the refrigerator for more than 5 consecutive days. Throw away any unused medicine after the expiration date. NOTE: This sheet is a summary. It may not cover all possible information. If you have questions about this medicine, talk to your doctor, pharmacist, or health care provider.  2020 Elsevier/Gold Standard (2018-02-02 19:58:39)

## 2019-03-11 ENCOUNTER — Ambulatory Visit: Payer: Medicaid Other

## 2019-03-11 ENCOUNTER — Inpatient Hospital Stay: Payer: Medicaid Other

## 2019-03-11 ENCOUNTER — Other Ambulatory Visit: Payer: Medicaid Other

## 2019-03-11 ENCOUNTER — Other Ambulatory Visit: Payer: Self-pay

## 2019-03-11 ENCOUNTER — Other Ambulatory Visit: Payer: Self-pay | Admitting: Medical Oncology

## 2019-03-11 ENCOUNTER — Telehealth: Payer: Self-pay | Admitting: Internal Medicine

## 2019-03-11 ENCOUNTER — Inpatient Hospital Stay (HOSPITAL_BASED_OUTPATIENT_CLINIC_OR_DEPARTMENT_OTHER): Payer: Medicaid Other | Admitting: Internal Medicine

## 2019-03-11 ENCOUNTER — Encounter: Payer: Self-pay | Admitting: Internal Medicine

## 2019-03-11 VITALS — BP 132/94 | HR 92 | Temp 98.5°F | Resp 19 | Ht 67.0 in | Wt 271.8 lb

## 2019-03-11 DIAGNOSIS — C3490 Malignant neoplasm of unspecified part of unspecified bronchus or lung: Secondary | ICD-10-CM | POA: Diagnosis not present

## 2019-03-11 DIAGNOSIS — Z95828 Presence of other vascular implants and grafts: Secondary | ICD-10-CM

## 2019-03-11 DIAGNOSIS — C3412 Malignant neoplasm of upper lobe, left bronchus or lung: Secondary | ICD-10-CM

## 2019-03-11 DIAGNOSIS — Z5111 Encounter for antineoplastic chemotherapy: Secondary | ICD-10-CM

## 2019-03-11 DIAGNOSIS — D6481 Anemia due to antineoplastic chemotherapy: Secondary | ICD-10-CM

## 2019-03-11 DIAGNOSIS — C3492 Malignant neoplasm of unspecified part of left bronchus or lung: Secondary | ICD-10-CM | POA: Diagnosis not present

## 2019-03-11 DIAGNOSIS — T451X5A Adverse effect of antineoplastic and immunosuppressive drugs, initial encounter: Secondary | ICD-10-CM

## 2019-03-11 DIAGNOSIS — I10 Essential (primary) hypertension: Secondary | ICD-10-CM

## 2019-03-11 DIAGNOSIS — D6959 Other secondary thrombocytopenia: Secondary | ICD-10-CM

## 2019-03-11 LAB — CBC WITH DIFFERENTIAL (CANCER CENTER ONLY)
Abs Immature Granulocytes: 0.24 10*3/uL — ABNORMAL HIGH (ref 0.00–0.07)
Basophils Absolute: 0 10*3/uL (ref 0.0–0.1)
Basophils Relative: 0 %
Eosinophils Absolute: 0 10*3/uL (ref 0.0–0.5)
Eosinophils Relative: 0 %
HCT: 24.8 % — ABNORMAL LOW (ref 36.0–46.0)
Hemoglobin: 8.1 g/dL — ABNORMAL LOW (ref 12.0–15.0)
Immature Granulocytes: 5 %
Lymphocytes Relative: 30 %
Lymphs Abs: 1.3 10*3/uL (ref 0.7–4.0)
MCH: 28.2 pg (ref 26.0–34.0)
MCHC: 32.7 g/dL (ref 30.0–36.0)
MCV: 86.4 fL (ref 80.0–100.0)
Monocytes Absolute: 0.4 10*3/uL (ref 0.1–1.0)
Monocytes Relative: 10 %
Neutro Abs: 2.5 10*3/uL (ref 1.7–7.7)
Neutrophils Relative %: 55 %
Platelet Count: 56 10*3/uL — ABNORMAL LOW (ref 150–400)
RBC: 2.87 MIL/uL — ABNORMAL LOW (ref 3.87–5.11)
RDW: 19.8 % — ABNORMAL HIGH (ref 11.5–15.5)
WBC Count: 4.5 10*3/uL (ref 4.0–10.5)
nRBC: 0 % (ref 0.0–0.2)

## 2019-03-11 LAB — CMP (CANCER CENTER ONLY)
ALT: 7 U/L (ref 0–44)
AST: 6 U/L — ABNORMAL LOW (ref 15–41)
Albumin: 3.3 g/dL — ABNORMAL LOW (ref 3.5–5.0)
Alkaline Phosphatase: 84 U/L (ref 38–126)
Anion gap: 9 (ref 5–15)
BUN: 21 mg/dL — ABNORMAL HIGH (ref 6–20)
CO2: 28 mmol/L (ref 22–32)
Calcium: 7.4 mg/dL — ABNORMAL LOW (ref 8.9–10.3)
Chloride: 106 mmol/L (ref 98–111)
Creatinine: 1.43 mg/dL — ABNORMAL HIGH (ref 0.44–1.00)
GFR, Est AFR Am: 47 mL/min — ABNORMAL LOW (ref >60)
GFR, Estimated: 40 mL/min — ABNORMAL LOW (ref >60)
Glucose, Bld: 94 mg/dL (ref 70–99)
Potassium: 3.4 mmol/L — ABNORMAL LOW (ref 3.5–5.1)
Sodium: 143 mmol/L (ref 135–145)
Total Bilirubin: 0.7 mg/dL (ref 0.3–1.2)
Total Protein: 6.5 g/dL (ref 6.5–8.1)

## 2019-03-11 LAB — MAGNESIUM: Magnesium: 1.3 mg/dL — CL (ref 1.7–2.4)

## 2019-03-11 MED ORDER — SODIUM CHLORIDE 0.9% FLUSH
10.0000 mL | INTRAVENOUS | Status: DC | PRN
  Administered 2019-03-11: 10 mL
  Filled 2019-03-11: qty 10

## 2019-03-11 MED ORDER — MAGNESIUM OXIDE 400 (241.3 MG) MG PO TABS: 400.0000 mg | ORAL_TABLET | Freq: Three times a day (TID) | ORAL | 0 refills | Status: DC

## 2019-03-11 MED ORDER — HEPARIN SOD (PORK) LOCK FLUSH 100 UNIT/ML IV SOLN
500.0000 [IU] | Freq: Once | INTRAVENOUS | Status: AC | PRN
  Administered 2019-03-11: 500 [IU]
  Filled 2019-03-11: qty 5

## 2019-03-11 NOTE — Progress Notes (Signed)
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  PRIOR THERAPY:  1) Carboplatin for AUC of 5 on day 1, etoposide 100 mg/M2 on days 1, 2 and 3 with Neulasta support in addition to [place].First dose February 10th, 2020. Status post 4 cycles. 2) Maintenance treatment with Tecentriq 1200 mg IV every 3 weeks.  First dose Sep 10, 2018.  Status post 3 cycles.  This was discontinued secondary to disease progression.   CURRENT THERAPY:  Systemic chemotherapy with cisplatin 30 mg/M2 and irinotecan 65 mg/M2 on days 1 and 8 every 3 weeks.  First dose October 28, 2018.  Status post 5 cycles.  INTERVAL HISTORY: Samantha Santos 58 y.o. female returns to the clinic today for follow-up visit.  The patient is feeling fine today with no concerning complaints.  She completed a Medrol Dosepak recently with the hope to improve her platelets count.  She denied having any bleeding issues.  She has no chest pain, shortness of breath, cough or hemoptysis.  She denied having any skin rash.  She has no nausea, vomiting, diarrhea or constipation.  Her treatment has been delayed by several weeks because of neutropenia and thrombocytopenia.  The patient is here today for evaluation before resuming her treatment.   MEDICAL HISTORY: Past Medical History:  Diagnosis Date  . Anemia   . Arthritis   . GERD (gastroesophageal reflux disease)   . Hypertension   . Morbid obesity ([place]) 05/06/2018  . Supraclavicular adenopathy     ALLERGIES:  is allergic to penicillins and oxycodone.  MEDICATIONS:  Current Outpatient Medications  Medication Sig Dispense Refill  . acetaminophen (TYLENOL) 500 MG tablet Take 500 mg by mouth every 6 (six) hours as needed.    .  diphenoxylate-atropine (LOMOTIL) 2.5-0.025 MG tablet 1 to 2 tablets 4 times daily as needed for diarrhea 40 tablet 1  . gabapentin (NEURONTIN) 100 MG capsule Take 1 capsule (100 mg total) by mouth at bedtime. 30 capsule 2  . HYDROcodone-acetaminophen (NORCO) 5-325 MG tablet Take 1 tablet by mouth every 6 (six) hours as needed for severe pain. 30 tablet 0  . lidocaine-prilocaine (EMLA) cream Apply 1 application topically as needed (chemo port). 30 g 0  . loratadine (CLARITIN) 10 MG tablet Take 10 mg by mouth daily.    Marland Kitchen LORazepam (ATIVAN) 0.5 MG tablet Place 1 tablet (0.5 mg total) under the tongue every 6 (six) hours as needed (Nausea). 30 tablet 1  . losartan-hydrochlorothiazide (HYZAAR) 100-12.5 MG tablet Take 1 tablet by mouth daily.    . methylPREDNISolone (MEDROL DOSEPAK) 4 MG TBPK tablet Use as instructed 21 tablet 0  . prochlorperazine (COMPAZINE) 10 MG tablet Take 1 tablet (10 mg total) by mouth every 6 (six) hours as needed for nausea or vomiting. 50 tablet 3  . rivaroxaban (XARELTO) 20 MG TABS tablet Take 1 tablet (20 mg total) by mouth daily with supper. 30 tablet 2  . traMADol (ULTRAM) 50 MG tablet Take 1 tablet (50 mg total) by mouth every 6 (six) hours as needed. 20 tablet 0   No current facility-administered medications for this visit.    Facility-Administered Medications Ordered in Other Visits  Medication Dose Route Frequency Provider Last Rate Last Dose  . sodium chloride flush (NS) 0.9 %  injection 10 mL  10 mL Intracatheter PRN Curt Bears, MD   10 mL at 03/11/19 5809    SURGICAL HISTORY:  Past Surgical History:  Procedure Laterality Date  . CESAREAN SECTION  1988  . HYSTEROSCOPY W/D&C  06/14/2011   Procedure: DILATATION AND CURETTAGE /HYSTEROSCOPY;  Surgeon: Frederico Hamman, MD;  Location: [facility];  Service: Gynecology;  Laterality: N/A;  . IR CV LINE INJECTION  06/25/2018  . PORTACATH PLACEMENT Left 06/13/2018   Procedure: INSERTION PORT-A-CATH;  Surgeon:  [name], MD;  Location: [facility];  Service: Thoracic;  Laterality: Left;  . SUPRACLAVICAL NODE BIOPSY Right 05/10/2018   Procedure: SUPRACLAVICAL NODE BIOPSY;  Surgeon: [name], MD;  Location: [facility];  Service: Thoracic;  Laterality: Right;  . TUBAL LIGATION      REVIEW OF SYSTEMS:  Constitutional: positive for fatigue Eyes: negative Ears, nose, mouth, throat, and face: negative Respiratory: negative Cardiovascular: negative Gastrointestinal: negative Genitourinary:negative Integument/breast: negative Hematologic/lymphatic: negative Musculoskeletal:positive for back pain Neurological: negative Behavioral/Psych: negative Endocrine: negative Allergic/Immunologic: negative   PHYSICAL EXAMINATION: General appearance: alert, cooperative, fatigued and no distress Head: Normocephalic, without obvious abnormality, atraumatic Neck: no adenopathy, no JVD, supple, symmetrical, trachea midline and thyroid not enlarged, symmetric, no tenderness/mass/nodules Lymph nodes: Cervical, supraclavicular, and axillary nodes normal. Resp: clear to auscultation bilaterally Back: symmetric, no curvature. ROM normal. No CVA tenderness. Cardio: regular rate and rhythm, S1, S2 normal, no murmur, click, rub or gallop GI: soft, non-tender; bowel sounds normal; no masses,  no organomegaly Extremities: extremities normal, atraumatic, no cyanosis or edema Neurologic: Mental status: Alert, oriented, thought content appropriate  ECOG PERFORMANCE STATUS: 1 - Symptomatic but completely ambulatory  Blood pressure (!) 132/94, pulse 92, temperature 98.5 F (36.9 C), temperature source Temporal, resp. rate 19, height 5\' 7"  (1.702 m), weight 271 lb 12.8 oz (123.3 kg), SpO2 100 %.  LABORATORY DATA: Lab Results  Component Value Date   WBC 4.5 03/11/2019   HGB 8.1 (L) 03/11/2019   HCT 24.8 (L) 03/11/2019   MCV 86.4 03/11/2019   PLT 56 (L) 03/11/2019      Chemistry      Component Value  Date/Time   NA 142 03/03/2019 1056   NA 144 04/08/2018 1145   K 3.4 (L) 03/03/2019 1056   CL 104 03/03/2019 1056   CO2 28 03/03/2019 1056   BUN 13 03/03/2019 1056   BUN 10 04/08/2018 1145   CREATININE 1.40 (H) 03/03/2019 1056      Component Value Date/Time   CALCIUM 8.2 (L) 03/03/2019 1056   ALKPHOS 86 03/03/2019 1056   AST <6 (L) 03/03/2019 1056   ALT 8 03/03/2019 1056   BILITOT 1.3 (H) 03/03/2019 1056       RADIOGRAPHIC STUDIES: No results found.  ASSESSMENT AND PLAN: This is a very pleasant 58 years old African-American female with stage IV high-grade neuroendocrine carcinoma and underwent systemic chemotherapy with carboplatin and etoposide and Tecentriq status post 7 cycles. Starting from cycle #5 the patient has been on treatment with maintenance Tecentriq status post 3 cycles. This treatment was discontinued secondary to disease progression. The patient is currently on treatment with systemic chemotherapy with cisplatin 30 mg/M2 and irinotecan 65 mg/M2 on days 1 and 8 every 3 weeks status post 5 cycles. The patient has been tolerating this treatment well.  She has been off treatment for several weeks secondary to significant neutropenia and thrombocytopenia.  She had improvement in her total white blood count and absolute neutrophil count but  platelets count are still low at 56,000. I recommended for the patient to continue holding her treatment for the next 2 weeks to enjoy the holiday and will have repeat blood work and evaluation in 2 weeks. For the chemotherapy-induced anemia, we will continue to monitor her hemoglobin and hematocrit closely and consider the patient for transfusion if needed. For the electrolyte abnormalities, will monitor her magnesium and potassium closely and consider her for replacement if needed. The patient was advised to call immediately if she has any concerning symptoms in the interval. For the chemotherapy-induced anemia, we will arrange for the  patient to receive 2 units of PRBCs transfusion later this week. For the hypomagnesemia, we will monitor her magnesium level and replace it as needed. The patient was advised to call immediately if she has any concerning symptoms in the interval. All questions were answered. The patient knows to call the clinic with any problems, questions or concerns. We can certainly see the patient much sooner if necessary.  Disclaimer: This note was dictated with voice recognition software. Similar sounding words can inadvertently be transcribed and may not be corrected upon review.

## 2019-03-11 NOTE — Telephone Encounter (Signed)
Scheduled per los. Gave avs and calendar  

## 2019-03-11 NOTE — Patient Instructions (Signed)

## 2019-03-12 ENCOUNTER — Ambulatory Visit: Payer: Medicaid Other

## 2019-03-18 ENCOUNTER — Ambulatory Visit: Payer: Medicaid Other

## 2019-03-18 ENCOUNTER — Other Ambulatory Visit: Payer: Medicaid Other

## 2019-03-18 ENCOUNTER — Ambulatory Visit: Payer: Medicaid Other | Admitting: Internal Medicine

## 2019-03-19 ENCOUNTER — Ambulatory Visit: Payer: Medicaid Other

## 2019-03-24 ENCOUNTER — Telehealth: Payer: Self-pay

## 2019-03-24 ENCOUNTER — Other Ambulatory Visit: Payer: Self-pay

## 2019-03-24 DIAGNOSIS — Z5111 Encounter for antineoplastic chemotherapy: Secondary | ICD-10-CM

## 2019-03-24 DIAGNOSIS — C3492 Malignant neoplasm of unspecified part of left bronchus or lung: Secondary | ICD-10-CM

## 2019-03-24 NOTE — Telephone Encounter (Signed)
CMN for RW and commode faxed to [facility]

## 2019-03-25 ENCOUNTER — Other Ambulatory Visit: Payer: Medicaid Other

## 2019-03-25 ENCOUNTER — Other Ambulatory Visit: Payer: Self-pay | Admitting: Emergency Medicine

## 2019-03-25 ENCOUNTER — Inpatient Hospital Stay: Payer: Medicaid Other

## 2019-03-25 ENCOUNTER — Inpatient Hospital Stay (HOSPITAL_BASED_OUTPATIENT_CLINIC_OR_DEPARTMENT_OTHER): Payer: Medicaid Other | Admitting: Internal Medicine

## 2019-03-25 ENCOUNTER — Encounter: Payer: Self-pay | Admitting: Internal Medicine

## 2019-03-25 ENCOUNTER — Ambulatory Visit: Payer: Medicaid Other | Admitting: Internal Medicine

## 2019-03-25 ENCOUNTER — Inpatient Hospital Stay: Payer: Medicaid Other | Attending: Internal Medicine

## 2019-03-25 ENCOUNTER — Other Ambulatory Visit: Payer: Self-pay

## 2019-03-25 VITALS — BP 129/87 | HR 96 | Temp 97.8°F | Resp 17 | Ht 67.0 in | Wt 268.1 lb

## 2019-03-25 DIAGNOSIS — Z5111 Encounter for antineoplastic chemotherapy: Secondary | ICD-10-CM | POA: Diagnosis present

## 2019-03-25 DIAGNOSIS — C3492 Malignant neoplasm of unspecified part of left bronchus or lung: Secondary | ICD-10-CM

## 2019-03-25 DIAGNOSIS — D6481 Anemia due to antineoplastic chemotherapy: Secondary | ICD-10-CM | POA: Insufficient documentation

## 2019-03-25 DIAGNOSIS — D6959 Other secondary thrombocytopenia: Secondary | ICD-10-CM | POA: Diagnosis not present

## 2019-03-25 DIAGNOSIS — M1990 Unspecified osteoarthritis, unspecified site: Secondary | ICD-10-CM | POA: Insufficient documentation

## 2019-03-25 DIAGNOSIS — E878 Other disorders of electrolyte and fluid balance, not elsewhere classified: Secondary | ICD-10-CM | POA: Insufficient documentation

## 2019-03-25 DIAGNOSIS — C3490 Malignant neoplasm of unspecified part of unspecified bronchus or lung: Secondary | ICD-10-CM | POA: Insufficient documentation

## 2019-03-25 DIAGNOSIS — D649 Anemia, unspecified: Secondary | ICD-10-CM

## 2019-03-25 DIAGNOSIS — Z79899 Other long term (current) drug therapy: Secondary | ICD-10-CM | POA: Insufficient documentation

## 2019-03-25 DIAGNOSIS — T451X5A Adverse effect of antineoplastic and immunosuppressive drugs, initial encounter: Secondary | ICD-10-CM

## 2019-03-25 DIAGNOSIS — Z95828 Presence of other vascular implants and grafts: Secondary | ICD-10-CM

## 2019-03-25 DIAGNOSIS — I10 Essential (primary) hypertension: Secondary | ICD-10-CM | POA: Diagnosis not present

## 2019-03-25 DIAGNOSIS — C7A1 Malignant poorly differentiated neuroendocrine tumors: Secondary | ICD-10-CM | POA: Diagnosis not present

## 2019-03-25 DIAGNOSIS — D61818 Other pancytopenia: Secondary | ICD-10-CM | POA: Insufficient documentation

## 2019-03-25 DIAGNOSIS — K219 Gastro-esophageal reflux disease without esophagitis: Secondary | ICD-10-CM | POA: Diagnosis not present

## 2019-03-25 DIAGNOSIS — C3412 Malignant neoplasm of upper lobe, left bronchus or lung: Secondary | ICD-10-CM

## 2019-03-25 LAB — CMP (CANCER CENTER ONLY)
ALT: 8 U/L (ref 0–44)
AST: 7 U/L — ABNORMAL LOW (ref 15–41)
Albumin: 3.2 g/dL — ABNORMAL LOW (ref 3.5–5.0)
Alkaline Phosphatase: 82 U/L (ref 38–126)
Anion gap: 8 (ref 5–15)
BUN: 13 mg/dL (ref 6–20)
CO2: 26 mmol/L (ref 22–32)
Calcium: 8.6 mg/dL — ABNORMAL LOW (ref 8.9–10.3)
Chloride: 109 mmol/L (ref 98–111)
Creatinine: 1.58 mg/dL — ABNORMAL HIGH (ref 0.44–1.00)
GFR, Est AFR Am: 41 mL/min — ABNORMAL LOW (ref >60)
GFR, Estimated: 36 mL/min — ABNORMAL LOW (ref >60)
Glucose, Bld: 105 mg/dL — ABNORMAL HIGH (ref 70–99)
Potassium: 3.6 mmol/L (ref 3.5–5.1)
Sodium: 143 mmol/L (ref 135–145)
Total Bilirubin: 0.8 mg/dL (ref 0.3–1.2)
Total Protein: 6.9 g/dL (ref 6.5–8.1)

## 2019-03-25 LAB — CBC WITH DIFFERENTIAL (CANCER CENTER ONLY)
Abs Immature Granulocytes: 0.04 10*3/uL (ref 0.00–0.07)
Basophils Absolute: 0 10*3/uL (ref 0.0–0.1)
Basophils Relative: 0 %
Eosinophils Absolute: 0 10*3/uL (ref 0.0–0.5)
Eosinophils Relative: 1 %
HCT: 21.8 % — ABNORMAL LOW (ref 36.0–46.0)
Hemoglobin: 7.1 g/dL — ABNORMAL LOW (ref 12.0–15.0)
Immature Granulocytes: 1 %
Lymphocytes Relative: 18 %
Lymphs Abs: 0.7 10*3/uL (ref 0.7–4.0)
MCH: 28 pg (ref 26.0–34.0)
MCHC: 32.6 g/dL (ref 30.0–36.0)
MCV: 85.8 fL (ref 80.0–100.0)
Monocytes Absolute: 0.4 10*3/uL (ref 0.1–1.0)
Monocytes Relative: 9 %
Neutro Abs: 2.8 10*3/uL (ref 1.7–7.7)
Neutrophils Relative %: 71 %
Platelet Count: 123 10*3/uL — ABNORMAL LOW (ref 150–400)
RBC: 2.54 MIL/uL — ABNORMAL LOW (ref 3.87–5.11)
RDW: 21.6 % — ABNORMAL HIGH (ref 11.5–15.5)
WBC Count: 3.9 10*3/uL — ABNORMAL LOW (ref 4.0–10.5)
nRBC: 0 % (ref 0.0–0.2)

## 2019-03-25 LAB — SAMPLE TO BLOOD BANK

## 2019-03-25 LAB — MAGNESIUM: Magnesium: 1.6 mg/dL — ABNORMAL LOW (ref 1.7–2.4)

## 2019-03-25 MED ORDER — PALONOSETRON HCL INJECTION 0.25 MG/5ML
0.2500 mg | Freq: Once | INTRAVENOUS | Status: AC
  Administered 2019-03-25: 0.25 mg via INTRAVENOUS

## 2019-03-25 MED ORDER — IRINOTECAN HCL CHEMO INJECTION 100 MG/5ML
50.0000 mg/m2 | Freq: Once | INTRAVENOUS | Status: AC
  Administered 2019-03-25: 120 mg via INTRAVENOUS
  Filled 2019-03-25: qty 6

## 2019-03-25 MED ORDER — HEPARIN SOD (PORK) LOCK FLUSH 100 UNIT/ML IV SOLN
500.0000 [IU] | Freq: Once | INTRAVENOUS | Status: AC | PRN
  Administered 2019-03-25: 500 [IU]
  Filled 2019-03-25: qty 5

## 2019-03-25 MED ORDER — PALONOSETRON HCL INJECTION 0.25 MG/5ML
INTRAVENOUS | Status: AC
  Filled 2019-03-25: qty 5

## 2019-03-25 MED ORDER — SODIUM CHLORIDE 0.9% FLUSH
10.0000 mL | INTRAVENOUS | Status: DC | PRN
  Administered 2019-03-25: 09:00:00 10 mL
  Filled 2019-03-25: qty 10

## 2019-03-25 MED ORDER — SODIUM CHLORIDE 0.9 % IV SOLN
Freq: Once | INTRAVENOUS | Status: AC
  Administered 2019-03-25: 13:00:00 via INTRAVENOUS
  Filled 2019-03-25: qty 5

## 2019-03-25 MED ORDER — SODIUM CHLORIDE 0.9 % IV SOLN
25.0000 mg/m2 | Freq: Once | INTRAVENOUS | Status: AC
  Administered 2019-03-25: 62 mg via INTRAVENOUS
  Filled 2019-03-25: qty 62

## 2019-03-25 MED ORDER — SODIUM CHLORIDE 0.9% FLUSH
10.0000 mL | INTRAVENOUS | Status: DC | PRN
  Administered 2019-03-25: 10 mL
  Filled 2019-03-25: qty 10

## 2019-03-25 MED ORDER — ATROPINE SULFATE 1 MG/ML IJ SOLN
0.5000 mg | Freq: Once | INTRAMUSCULAR | Status: AC | PRN
  Administered 2019-03-25: 0.5 mg via INTRAVENOUS

## 2019-03-25 MED ORDER — POTASSIUM CHLORIDE 2 MEQ/ML IV SOLN
Freq: Once | INTRAVENOUS | Status: AC
  Administered 2019-03-25: 10:00:00 via INTRAVENOUS
  Filled 2019-03-25: qty 1000

## 2019-03-25 MED ORDER — ATROPINE SULFATE 1 MG/ML IJ SOLN
INTRAMUSCULAR | Status: AC
  Filled 2019-03-25: qty 1

## 2019-03-25 MED ORDER — SODIUM CHLORIDE 0.9 % IV SOLN
Freq: Once | INTRAVENOUS | Status: AC
  Administered 2019-03-25: 10:00:00 via INTRAVENOUS
  Filled 2019-03-25: qty 250

## 2019-03-25 NOTE — Progress Notes (Signed)
[facility] Telephone:([phone]   Fax:(336) [phone]  OFFICE PROGRESS NOTE  [name], MD [place] [address]  DIAGNOSIS: Stage IV (T2 a, N3, M1 C) high-grade neuroendocrine carcinoma with focal areas of small cell carcinoma diagnosed in January 2020.  PRIOR THERAPY:  1) Carboplatin for AUC of 5 on day 1, etoposide 100 mg/M2 on days 1, 2 and 3 with Neulasta support in addition to [place].First dose February 10th, 2020. Status post 4 cycles. 2) Maintenance treatment with Tecentriq 1200 mg IV every 3 weeks.  First dose Sep 10, 2018.  Status post 3 cycles.  This was discontinued secondary to disease progression.   CURRENT THERAPY:  Systemic chemotherapy with cisplatin 30 mg/M2 and irinotecan 65 mg/M2 on days 1 and 8 every 3 weeks.  First dose October 28, 2018.  Status post 5 cycles.  INTERVAL HISTORY: Samantha Santos 58 y.o. female returns to the clinic today for follow-up visit.  The patient is feeling fine today with no concerning complaints except for fatigue.  She enjoyed her Thanksgiving.  She has been off treatment for several weeks because of the significant pancytopenia.  She denied having any current chest pain, shortness of breath, cough or hemoptysis.  She denied having any fever or chills.  She has no nausea, vomiting, diarrhea or constipation.  She has no headache or visual changes.  She is here today for reevaluation before resuming her treatment with systemic chemotherapy.  MEDICAL HISTORY: Past Medical History:  Diagnosis Date  . Anemia   . Arthritis   . GERD (gastroesophageal reflux disease)   . Hypertension   . Morbid obesity ([place]) 05/06/2018  . Supraclavicular adenopathy     ALLERGIES:  is allergic to penicillins and oxycodone.  MEDICATIONS:  Current Outpatient Medications  Medication Sig Dispense Refill  . acetaminophen (TYLENOL) 500 MG tablet Take 500 mg by mouth every 6 (six) hours as needed.    .  diphenoxylate-atropine (LOMOTIL) 2.5-0.025 MG tablet 1 to 2 tablets 4 times daily as needed for diarrhea (Patient not taking: Reported on 03/11/2019) 40 tablet 1  . gabapentin (NEURONTIN) 100 MG capsule Take 1 capsule (100 mg total) by mouth at bedtime. 30 capsule 2  . HYDROcodone-acetaminophen (NORCO) 5-325 MG tablet Take 1 tablet by mouth every 6 (six) hours as needed for severe pain. (Patient not taking: Reported on 03/11/2019) 30 tablet 0  . lidocaine-prilocaine (EMLA) cream Apply 1 application topically as needed (chemo port). 30 g 0  . loratadine (CLARITIN) 10 MG tablet Take 10 mg by mouth daily.    Marland Kitchen LORazepam (ATIVAN) 0.5 MG tablet Place 1 tablet (0.5 mg total) under the tongue every 6 (six) hours as needed (Nausea). (Patient not taking: Reported on 03/11/2019) 30 tablet 1  . losartan-hydrochlorothiazide (HYZAAR) 100-12.5 MG tablet Take 1 tablet by mouth daily.    . magnesium oxide (MAG-OX) 400 (241.3 Mg) MG tablet Take 1 tablet (400 mg total) by mouth 3 (three) times daily. 90 tablet 0  . prochlorperazine (COMPAZINE) 10 MG tablet Take 1 tablet (10 mg total) by mouth every 6 (six) hours as needed for nausea or vomiting. (Patient not taking: Reported on 03/11/2019) 50 tablet 3  . rivaroxaban (XARELTO) 20 MG TABS tablet Take 1 tablet (20 mg total) by mouth daily with supper. 30 tablet 2  . traMADol (ULTRAM) 50 MG tablet Take 1 tablet (50 mg total) by mouth every 6 (six) hours as needed. (Patient not taking: Reported on 03/11/2019)  20 tablet 0   No current facility-administered medications for this visit.    Facility-Administered Medications Ordered in Other Visits  Medication Dose Route Frequency Provider Last Rate Last Dose  . sodium chloride flush (NS) 0.9 % injection 10 mL  10 mL Intracatheter PRN Curt Bears, MD   10 mL at 03/25/19 2426    SURGICAL HISTORY:  Past Surgical History:  Procedure Laterality Date  . CESAREAN SECTION  1988  . HYSTEROSCOPY W/D&C  06/14/2011   Procedure:  DILATATION AND CURETTAGE /HYSTEROSCOPY;  Surgeon: Frederico Hamman, MD;  Location: [facility];  Service: Gynecology;  Laterality: N/A;  . IR CV LINE INJECTION  06/25/2018  . PORTACATH PLACEMENT Left 06/13/2018   Procedure: INSERTION PORT-A-CATH;  Surgeon: [name], MD;  Location: [facility];  Service: Thoracic;  Laterality: Left;  . SUPRACLAVICAL NODE BIOPSY Right 05/10/2018   Procedure: SUPRACLAVICAL NODE BIOPSY;  Surgeon: [name], MD;  Location: [facility];  Service: Thoracic;  Laterality: Right;  . TUBAL LIGATION      REVIEW OF SYSTEMS:  Constitutional: positive for fatigue Eyes: negative Ears, nose, mouth, throat, and face: negative Respiratory: negative Cardiovascular: negative Gastrointestinal: negative Genitourinary:negative Integument/breast: negative Hematologic/lymphatic: negative Musculoskeletal:positive for muscle weakness Neurological: negative Behavioral/Psych: negative Endocrine: negative Allergic/Immunologic: negative   PHYSICAL EXAMINATION: General appearance: alert, cooperative, fatigued and no distress Head: Normocephalic, without obvious abnormality, atraumatic Neck: no adenopathy, no JVD, supple, symmetrical, trachea midline and thyroid not enlarged, symmetric, no tenderness/mass/nodules Lymph nodes: Cervical, supraclavicular, and axillary nodes normal. Resp: clear to auscultation bilaterally Back: symmetric, no curvature. ROM normal. No CVA tenderness. Cardio: regular rate and rhythm, S1, S2 normal, no murmur, click, rub or gallop GI: soft, non-tender; bowel sounds normal; no masses,  no organomegaly Extremities: extremities normal, atraumatic, no cyanosis or edema Neurologic: Mental status: Alert, oriented, thought content appropriate  ECOG PERFORMANCE STATUS: 1 - Symptomatic but completely ambulatory  Blood pressure 129/87, pulse 96, temperature 97.8 F (36.6 C), temperature source Oral, resp. rate 17, height 5\' 7"  (1.702 m), weight 268 lb 1.6  oz (121.6 kg), SpO2 100 %.  LABORATORY DATA: Lab Results  Component Value Date   WBC 3.9 (L) 03/25/2019   HGB 7.1 (L) 03/25/2019   HCT 21.8 (L) 03/25/2019   MCV 85.8 03/25/2019   PLT 123 (L) 03/25/2019      Chemistry      Component Value Date/Time   NA 143 03/11/2019 0815   NA 144 04/08/2018 1145   K 3.4 (L) 03/11/2019 0815   CL 106 03/11/2019 0815   CO2 28 03/11/2019 0815   BUN 21 (H) 03/11/2019 0815   BUN 10 04/08/2018 1145   CREATININE 1.43 (H) 03/11/2019 0815      Component Value Date/Time   CALCIUM 7.4 (L) 03/11/2019 0815   ALKPHOS 84 03/11/2019 0815   AST 6 (L) 03/11/2019 0815   ALT 7 03/11/2019 0815   BILITOT 0.7 03/11/2019 0815       RADIOGRAPHIC STUDIES: No results found.  ASSESSMENT AND PLAN: This is a very pleasant 58 years old African-American female with stage IV high-grade neuroendocrine carcinoma and underwent systemic chemotherapy with carboplatin and etoposide and Tecentriq status post 7 cycles. Starting from cycle #5 the patient has been on treatment with maintenance Tecentriq status post 3 cycles. This treatment was discontinued secondary to disease progression. The patient is currently on treatment with systemic chemotherapy with cisplatin 30 mg/M2 and irinotecan 65 mg/M2 on days 1 and 8 every 3 weeks status post 5  cycles. The patient has been off treatment for several weeks because of the significant pancytopenia. She is feeling much better today and there is improvement in her platelets count as well as total white blood count. She continue to have severe anemia. I recommended for the patient to proceed with day 1 of cycle #6 today as planned. For the chemotherapy-induced anemia, we will arrange for the patient to receive 2 units of PRBCs transfusion. For the electrolyte abnormalities, we'll continue to monitor closely and replace on as-needed basis. The patient will come back for follow-up visit in 3 weeks for evaluation before starting cycle #7.  The patient was advised to call immediately if she has any concerning symptoms in the interval.  All questions were answered. The patient knows to call the clinic with any problems, questions or concerns. We can certainly see the patient much sooner if necessary.  Disclaimer: This note was dictated with voice recognition software. Similar sounding words can inadvertently be transcribed and may not be corrected upon review.

## 2019-03-25 NOTE — Patient Instructions (Signed)
[facility] Discharge Instructions for Patients Receiving Chemotherapy  Today you received the following chemotherapy agents :  Irinotecan,  Cisplatin.  To help prevent nausea and vomiting after your treatment, we encourage you to take your nausea medication as prescribed.   If you develop nausea and vomiting that is not controlled by your nausea medication, call the clinic.   BELOW ARE SYMPTOMS THAT SHOULD BE REPORTED IMMEDIATELY:  *FEVER GREATER THAN 100.5 F  *CHILLS WITH OR WITHOUT FEVER  NAUSEA AND VOMITING THAT IS NOT CONTROLLED WITH YOUR NAUSEA MEDICATION  *UNUSUAL SHORTNESS OF BREATH  *UNUSUAL BRUISING OR BLEEDING  TENDERNESS IN MOUTH AND THROAT WITH OR WITHOUT PRESENCE OF ULCERS  *URINARY PROBLEMS  *BOWEL PROBLEMS  UNUSUAL RASH Items with * indicate a potential emergency and should be followed up as soon as possible.  Feel free to call the clinic should you have any questions or concerns. The clinic phone number is [phone].  Please show the [facility] at check-in to the Emergency Department and triage nurse.

## 2019-03-25 NOTE — Progress Notes (Signed)
Dr. Julien Nordmann reviewed lab results today.  OK to treat per MD with all lab results today.  Pt to receive 2 units of blood this week.  Spoke with Carolann Littler, pharmacist to add additional 2g Mag to IVF today as per MD.  Lonn Georgia, RN infusion notified.

## 2019-03-26 ENCOUNTER — Other Ambulatory Visit: Payer: Self-pay | Admitting: Physician Assistant

## 2019-03-26 ENCOUNTER — Encounter: Payer: Self-pay | Admitting: *Deleted

## 2019-03-26 ENCOUNTER — Other Ambulatory Visit: Payer: Self-pay

## 2019-03-26 ENCOUNTER — Inpatient Hospital Stay: Payer: Medicaid Other

## 2019-03-26 ENCOUNTER — Telehealth: Payer: Self-pay | Admitting: Emergency Medicine

## 2019-03-26 ENCOUNTER — Telehealth: Payer: Self-pay

## 2019-03-26 ENCOUNTER — Telehealth: Payer: Self-pay | Admitting: Internal Medicine

## 2019-03-26 DIAGNOSIS — T451X5A Adverse effect of antineoplastic and immunosuppressive drugs, initial encounter: Secondary | ICD-10-CM

## 2019-03-26 DIAGNOSIS — G893 Neoplasm related pain (acute) (chronic): Secondary | ICD-10-CM

## 2019-03-26 DIAGNOSIS — C3492 Malignant neoplasm of unspecified part of left bronchus or lung: Secondary | ICD-10-CM

## 2019-03-26 DIAGNOSIS — C3412 Malignant neoplasm of upper lobe, left bronchus or lung: Secondary | ICD-10-CM

## 2019-03-26 DIAGNOSIS — Z5111 Encounter for antineoplastic chemotherapy: Secondary | ICD-10-CM | POA: Diagnosis not present

## 2019-03-26 MED ORDER — DIPHENHYDRAMINE HCL 25 MG PO CAPS
25.0000 mg | ORAL_CAPSULE | Freq: Once | ORAL | Status: AC
  Administered 2019-03-26: 25 mg via ORAL

## 2019-03-26 MED ORDER — SODIUM CHLORIDE 0.9% IV SOLUTION
250.0000 mL | Freq: Once | INTRAVENOUS | Status: AC
  Administered 2019-03-26: 250 mL via INTRAVENOUS
  Filled 2019-03-26: qty 250

## 2019-03-26 MED ORDER — ACETAMINOPHEN 325 MG PO TABS
650.0000 mg | ORAL_TABLET | Freq: Once | ORAL | Status: AC
  Administered 2019-03-26: 650 mg via ORAL

## 2019-03-26 MED ORDER — SODIUM CHLORIDE 0.9% FLUSH
10.0000 mL | INTRAVENOUS | Status: AC | PRN
  Administered 2019-03-26: 10 mL
  Filled 2019-03-26: qty 10

## 2019-03-26 MED ORDER — GABAPENTIN 100 MG PO CAPS: 100.0000 mg | ORAL_CAPSULE | Freq: Every day | ORAL | 2 refills | Status: AC

## 2019-03-26 MED ORDER — ACETAMINOPHEN 325 MG PO TABS
ORAL_TABLET | ORAL | Status: AC
  Filled 2019-03-26: qty 2

## 2019-03-26 MED ORDER — SODIUM CHLORIDE 0.9% FLUSH
10.0000 mL | Freq: Once | INTRAVENOUS | Status: DC
  Filled 2019-03-26: qty 10

## 2019-03-26 MED ORDER — HEPARIN SOD (PORK) LOCK FLUSH 100 UNIT/ML IV SOLN
500.0000 [IU] | Freq: Every day | INTRAVENOUS | Status: AC | PRN
  Administered 2019-03-26: 500 [IU]
  Filled 2019-03-26: qty 5

## 2019-03-26 MED ORDER — HYDROCODONE-ACETAMINOPHEN 5-325 MG PO TABS: 1.0000 | ORAL_TABLET | Freq: Four times a day (QID) | ORAL | 0 refills | Status: DC | PRN

## 2019-03-26 MED ORDER — HEPARIN SOD (PORK) LOCK FLUSH 100 UNIT/ML IV SOLN
500.0000 [IU] | Freq: Once | INTRAVENOUS | Status: DC
  Filled 2019-03-26: qty 5

## 2019-03-26 MED ORDER — DIPHENHYDRAMINE HCL 25 MG PO CAPS
ORAL_CAPSULE | ORAL | Status: AC
  Filled 2019-03-26: qty 1

## 2019-03-26 NOTE — Telephone Encounter (Signed)
Scheduled per los. Called and left msg. Mailed prntout

## 2019-03-26 NOTE — Patient Instructions (Signed)
Blood Transfusion, Adult, Care After This sheet gives you information about how to care for yourself after your procedure. Your doctor may also give you more specific instructions. If you have problems or questions, contact your doctor. Follow these instructions at home:   Take over-the-counter and prescription medicines only as told by your doctor.  Go back to your normal activities as told by your doctor.  Follow instructions from your doctor about how to take care of the area where an IV tube was put into your vein (insertion site). Make sure you: ? Wash your hands with soap and water before you change your bandage (dressing). If there is no soap and water, use hand sanitizer. ? Change your bandage as told by your doctor.  Check your IV insertion site every day for signs of infection. Check for: ? More redness, swelling, or pain. ? More fluid or blood. ? Warmth. ? Pus or a bad smell. Contact a doctor if:  You have more redness, swelling, or pain around the IV insertion site.  You have more fluid or blood coming from the IV insertion site.  Your IV insertion site feels warm to the touch.  You have pus or a bad smell coming from the IV insertion site.  Your pee (urine) turns pink, red, or brown.  You feel weak after doing your normal activities. Get help right away if:  You have signs of a serious allergic or body defense (immune) system reaction, including: ? Itchiness. ? Hives. ? Trouble breathing. ? Anxiety. ? Pain in your chest or lower back. ? Fever, flushing, and chills. ? Fast pulse. ? Rash. ? Watery poop (diarrhea). ? Throwing up (vomiting). ? Dark pee. ? Serious headache. ? Dizziness. ? Stiff neck. ? Yellow color in your face or the white parts of your eyes (jaundice). Summary  After a blood transfusion, return to your normal activities as told by your doctor.  Every day, check for signs of infection where the IV tube was put into your vein.  Some  signs of infection are warm skin, more redness and pain, more fluid or blood, and pus or a bad smell where the needle went in.  Contact your doctor if you feel weak or have any unusual symptoms. This information is not intended to replace advice given to you by your health care provider. Make sure you discuss any questions you have with your health care provider. Document Released: 04/24/2014 Document Revised: 08/08/2017 Document Reviewed: 11/26/2015 Elsevier Patient Education  2020 Elsevier Inc.  

## 2019-03-26 NOTE — Telephone Encounter (Signed)
Pt requesting refill for norco and neurontin.

## 2019-03-26 NOTE — Progress Notes (Signed)
Pt received 2 units PRBCs today, tolerated well.  Reports feeling better at end of tx with VSS.  Declined any food or drink or needing to use restroom during transfusions.  Verbalizes understanding of d/c and f/u information, denies any questions or concerns at time of d/c.

## 2019-03-26 NOTE — Telephone Encounter (Signed)
Called pt regarding requested refills (norco & gabapentin) during transfusions today to alert her to refills being sent to pharmacy.  Pt verbalizes understanding and denies any further needs at this time.

## 2019-03-27 LAB — BPAM RBC
Blood Product Expiration Date: 202101102359
Blood Product Expiration Date: 202101102359
ISSUE DATE / TIME: 202012090910
ISSUE DATE / TIME: 202012090910
Unit Type and Rh: 5100
Unit Type and Rh: 5100

## 2019-03-27 LAB — TYPE AND SCREEN
ABO/RH(D): O POS
Antibody Screen: NEGATIVE
Unit division: 0
Unit division: 0

## 2019-03-31 ENCOUNTER — Ambulatory Visit
Admission: RE | Admit: 2019-03-31 | Discharge: 2019-03-31 | Disposition: A | Discharge status: Home / Self Care | Payer: Medicaid Other | Source: Ambulatory Visit | Attending: Radiation Oncology | Admitting: Radiation Oncology

## 2019-03-31 ENCOUNTER — Other Ambulatory Visit: Payer: Self-pay

## 2019-03-31 ENCOUNTER — Encounter: Payer: Self-pay | Admitting: Radiation Oncology

## 2019-03-31 ENCOUNTER — Ambulatory Visit: Payer: Medicaid Other | Admitting: Radiation Oncology

## 2019-03-31 VITALS — BP 131/91 | HR 79 | Temp 98.3°F | Resp 20 | Ht 67.0 in

## 2019-03-31 DIAGNOSIS — D696 Thrombocytopenia, unspecified: Secondary | ICD-10-CM | POA: Insufficient documentation

## 2019-03-31 DIAGNOSIS — Z79899 Other long term (current) drug therapy: Secondary | ICD-10-CM | POA: Diagnosis not present

## 2019-03-31 DIAGNOSIS — C3412 Malignant neoplasm of upper lobe, left bronchus or lung: Secondary | ICD-10-CM | POA: Diagnosis not present

## 2019-03-31 DIAGNOSIS — Z7901 Long term (current) use of anticoagulants: Secondary | ICD-10-CM | POA: Insufficient documentation

## 2019-03-31 DIAGNOSIS — Z923 Personal history of irradiation: Secondary | ICD-10-CM | POA: Diagnosis not present

## 2019-03-31 NOTE — Patient Instructions (Signed)
Coronavirus (COVID-19) Are you at risk?  Are you at risk for the Coronavirus (COVID-19)?  To be considered HIGH RISK for Coronavirus (COVID-19), you have to meet the following criteria:  . Traveled to [place], [place], [place], [place] or [place]; or in the [place] to [place], [place], [place], or [place]; and have fever, cough, and shortness of breath within the last 2 weeks of travel OR . Been in close contact with a person diagnosed with COVID-19 within the last 2 weeks and have fever, cough, and shortness of breath . IF YOU DO NOT MEET THESE CRITERIA, YOU ARE CONSIDERED LOW RISK FOR COVID-19.  What to do if you are HIGH RISK for COVID-19?  . If you are having a medical emergency, call 911. . Seek medical care right away. Before you go to a doctor's office, urgent care or emergency department, call ahead and tell them about your recent travel, contact with someone diagnosed with COVID-19, and your symptoms. You should receive instructions from your physician's office regarding next steps of care.  . When you arrive at healthcare provider, tell the healthcare staff immediately you have returned from visiting [place], [place], [place], [place] or [place]; or traveled in the [place] to [place], [place], [place], or [place]; in the last two weeks or you have been in close contact with a person diagnosed with COVID-19 in the last 2 weeks.   . Tell the health care staff about your symptoms: fever, cough and shortness of breath. . After you have been seen by a medical provider, you will be either: o Tested for (COVID-19) and discharged home on quarantine except to seek medical care if symptoms worsen, and asked to  - Stay home and avoid contact with others until you get your results (4-5 days)  - Avoid travel on public transportation if possible (such as bus, train, or airplane) or o Sent to the Emergency Department by EMS for evaluation, COVID-19 testing, and possible  admission depending on your condition and test results.  What to do if you are LOW RISK for COVID-19?  Reduce your risk of any infection by using the same precautions used for avoiding the common cold or flu:  . Wash your hands often with soap and warm water for at least 20 seconds.  If soap and water are not readily available, use an alcohol-based hand sanitizer with at least 60% alcohol.  . If coughing or sneezing, cover your mouth and nose by coughing or sneezing into the elbow areas of your shirt or coat, into a tissue or into your sleeve (not your hands). . Avoid shaking hands with others and consider head nods or verbal greetings only. . Avoid touching your eyes, nose, or mouth with unwashed hands.  . Avoid close contact with people who are sick. . Avoid places or events with large numbers of people in one location, like concerts or sporting events. . Carefully consider travel plans you have or are making. . If you are planning any travel outside or inside the [place], visit the CDC's Travelers' Health webpage for the latest health notices. . If you have some symptoms but not all symptoms, continue to monitor at home and seek medical attention if your symptoms worsen. . If you are having a medical emergency, call 911.   ADDITIONAL HEALTHCARE OPTIONS FOR PATIENTS  [facility] / e-Visit: [email or web address]         [facility]: [phone]  [facility]: [phone]                   [facility]: [phone]   

## 2019-03-31 NOTE — Progress Notes (Signed)
Radiation Oncology         [phone] ________________________________  Name: Samantha Santos MRN: [id number]  Date: 03/31/2019  DOB: [birth year]  Follow-Up Visit Note  CC: [name], MD  Heilingoetter, Cassandr*    ICD-10-CM   1. 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  Radiation Treatment Dates: 02/13/2019 through 02/26/2019 Site Technique Total Dose (Gy) Dose per Fx (Gy) Completed Fx Beam Energies  Thorax: Chest 3D 30/30 3 10/10 6X, 10X, 15X    Narrative:  The patient returns today for routine follow-up. She is doing well overall. Since end of treatment, patient has been under the care of Dr. Julien Nordmann, whom she last saw on 03/25/2019. She is being treated with systemic chemotherapy with Cisplatin 30 mg/M2 and Irinotecan 65 mg/M2. Patient's chemotherapy treatment was postponed by several weeks secondary to significant neutropenia and thrombocytopenia.   On review of systems, patient reports no breathing issues. Patient denies discomfort with swallowing or difficulty with swallowing.              ALLERGIES:  is allergic to penicillins and oxycodone.  Meds: Current Outpatient Medications  Medication Sig Dispense Refill  . acetaminophen (TYLENOL) 500 MG tablet Take 500 mg by mouth every 6 (six) hours as needed.    . diphenoxylate-atropine (LOMOTIL) 2.5-0.025 MG tablet 1 to 2 tablets 4 times daily as needed for diarrhea 40 tablet 1  . gabapentin (NEURONTIN) 100 MG capsule Take 1 capsule (100 mg total) by mouth at bedtime. 30 capsule 2  . HYDROcodone-acetaminophen (NORCO) 5-325 MG tablet Take 1 tablet by mouth every 6 (six) hours as needed for severe pain. 30 tablet 0  . lidocaine-prilocaine (EMLA) cream Apply 1 application topically as needed (chemo port). 30 g 0  . loratadine  (CLARITIN) 10 MG tablet Take 10 mg by mouth daily.    Marland Kitchen LORazepam (ATIVAN) 0.5 MG tablet Place 1 tablet (0.5 mg total) under the tongue every 6 (six) hours as needed (Nausea). 30 tablet 1  . losartan-hydrochlorothiazide (HYZAAR) 100-12.5 MG tablet Take 1 tablet by mouth daily.    . magnesium oxide (MAG-OX) 400 (241.3 Mg) MG tablet Take 1 tablet (400 mg total) by mouth 3 (three) times daily. 90 tablet 0  . prochlorperazine (COMPAZINE) 10 MG tablet Take 1 tablet (10 mg total) by mouth every 6 (six) hours as needed for nausea or vomiting. 50 tablet 3  . rivaroxaban (XARELTO) 20 MG TABS tablet Take 1 tablet (20 mg total) by mouth daily with supper. 30 tablet 2  . traMADol (ULTRAM) 50 MG tablet Take 1 tablet (50 mg total) by mouth every 6 (six) hours as needed. (Patient not taking: Reported on 03/11/2019) 20 tablet 0   No current facility-administered medications for this encounter.    Physical Findings: The patient is in no acute distress. Patient is alert and oriented.  height is 5\' 7"  (1.702 m). Her temporal temperature is 98.3 F (36.8 C). Her blood pressure is 131/91 (abnormal) and her pulse is 79. Her respiration is 20 and oxygen saturation is 100%. .  No significant changes. Lungs are clear to auscultation bilaterally. Heart has regular rate and rhythm. No palpable cervical, supraclavicular, or axillary adenopathy. Abdomen soft, non-tender, normal bowel sounds.   Lab Findings: Lab Results  Component Value Date   WBC 3.9 (  L) 03/25/2019   HGB 7.1 (L) 03/25/2019   HCT 21.8 (L) 03/25/2019   MCV 85.8 03/25/2019   PLT 123 (L) 03/25/2019    Radiographic Findings: No results found.  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  Her breathing is stable at this time.  Plan: Patient will follow up with Dr. Julien Nordmann on 04/15/2019 and radiation oncology prn  follow-up.  ____________________________________   Blair Promise, PhD, MD  This document serves as a record of services personally performed by Gery Pray, MD. It was created on his behalf by Clerance Lav, a trained medical scribe. The creation of this record is based on the scribe's personal observations and the provider's statements to them. This document has been checked and approved by the attending provider.

## 2019-03-31 NOTE — Progress Notes (Signed)
Pt presents today for f/u with Dr. Sondra Come. Pt denies c/o pain, but states "stomach is messed up". Pt denies cough, hemoptysis. Pt reports occasional SOB with exertion. Pt denies difficulty swallowing. Pt reports anxiety with chemotherapy.   BP (!) 131/91 (BP Location: Right Arm, Patient Position: Sitting)   Pulse 79   Temp 98.3 F (36.8 C) (Temporal)   Resp 20   Ht 5\' 7"  (1.702 m)   SpO2 100%   BMI 42.73 kg/m   Wt Readings from Last 3 Encounters:  03/26/19 272 lb 12.8 oz (123.7 kg)  03/25/19 268 lb 1.6 oz (121.6 kg)  03/11/19 271 lb 12.8 oz (123.3 kg)   Loma Sousa, RN BSN

## 2019-04-01 ENCOUNTER — Inpatient Hospital Stay: Payer: Medicaid Other

## 2019-04-01 ENCOUNTER — Other Ambulatory Visit: Payer: Self-pay

## 2019-04-01 VITALS — BP 130/96 | HR 83 | Temp 98.0°F | Resp 18

## 2019-04-01 DIAGNOSIS — Z95828 Presence of other vascular implants and grafts: Secondary | ICD-10-CM

## 2019-04-01 DIAGNOSIS — C3412 Malignant neoplasm of upper lobe, left bronchus or lung: Secondary | ICD-10-CM

## 2019-04-01 DIAGNOSIS — Z5111 Encounter for antineoplastic chemotherapy: Secondary | ICD-10-CM

## 2019-04-01 DIAGNOSIS — C3492 Malignant neoplasm of unspecified part of left bronchus or lung: Secondary | ICD-10-CM

## 2019-04-01 LAB — CMP (CANCER CENTER ONLY)
ALT: 8 U/L (ref 0–44)
AST: <6 U/L — ABNORMAL LOW (ref 15–41)
Albumin: 3.2 g/dL — ABNORMAL LOW (ref 3.5–5.0)
Alkaline Phosphatase: 79 U/L (ref 38–126)
Anion gap: 12 (ref 5–15)
BUN: 18 mg/dL (ref 6–20)
CO2: 24 mmol/L (ref 22–32)
Calcium: 8.4 mg/dL — ABNORMAL LOW (ref 8.9–10.3)
Chloride: 105 mmol/L (ref 98–111)
Creatinine: 1.51 mg/dL — ABNORMAL HIGH (ref 0.44–1.00)
GFR, Est AFR Am: 44 mL/min — ABNORMAL LOW (ref >60)
GFR, Estimated: 38 mL/min — ABNORMAL LOW (ref >60)
Glucose, Bld: 128 mg/dL — ABNORMAL HIGH (ref 70–99)
Potassium: 3.6 mmol/L (ref 3.5–5.1)
Sodium: 141 mmol/L (ref 135–145)
Total Bilirubin: 0.6 mg/dL (ref 0.3–1.2)
Total Protein: 6.7 g/dL (ref 6.5–8.1)

## 2019-04-01 LAB — CBC WITH DIFFERENTIAL (CANCER CENTER ONLY)
Abs Immature Granulocytes: 0.04 10*3/uL (ref 0.00–0.07)
Basophils Absolute: 0 10*3/uL (ref 0.0–0.1)
Basophils Relative: 0 %
Eosinophils Absolute: 0 10*3/uL (ref 0.0–0.5)
Eosinophils Relative: 0 %
HCT: 26.1 % — ABNORMAL LOW (ref 36.0–46.0)
Hemoglobin: 8.7 g/dL — ABNORMAL LOW (ref 12.0–15.0)
Immature Granulocytes: 1 %
Lymphocytes Relative: 21 %
Lymphs Abs: 0.9 10*3/uL (ref 0.7–4.0)
MCH: 28.9 pg (ref 26.0–34.0)
MCHC: 33.3 g/dL (ref 30.0–36.0)
MCV: 86.7 fL (ref 80.0–100.0)
Monocytes Absolute: 0.2 10*3/uL (ref 0.1–1.0)
Monocytes Relative: 5 %
Neutro Abs: 3 10*3/uL (ref 1.7–7.7)
Neutrophils Relative %: 73 %
Platelet Count: 120 10*3/uL — ABNORMAL LOW (ref 150–400)
RBC: 3.01 MIL/uL — ABNORMAL LOW (ref 3.87–5.11)
RDW: 18.6 % — ABNORMAL HIGH (ref 11.5–15.5)
WBC Count: 4.1 10*3/uL (ref 4.0–10.5)
nRBC: 0 % (ref 0.0–0.2)

## 2019-04-01 LAB — MAGNESIUM: Magnesium: 1.5 mg/dL — ABNORMAL LOW (ref 1.7–2.4)

## 2019-04-01 MED ORDER — SODIUM CHLORIDE 0.9 % IV SOLN
Freq: Once | INTRAVENOUS | Status: AC
  Filled 2019-04-01: qty 5

## 2019-04-01 MED ORDER — SODIUM CHLORIDE 0.9% FLUSH
10.0000 mL | INTRAVENOUS | Status: DC | PRN
  Administered 2019-04-01: 10 mL
  Filled 2019-04-01: qty 10

## 2019-04-01 MED ORDER — IRINOTECAN HCL CHEMO INJECTION 100 MG/5ML
50.0000 mg/m2 | Freq: Once | INTRAVENOUS | Status: AC
  Administered 2019-04-01: 120 mg via INTRAVENOUS
  Filled 2019-04-01: qty 6

## 2019-04-01 MED ORDER — HEPARIN SOD (PORK) LOCK FLUSH 100 UNIT/ML IV SOLN
500.0000 [IU] | Freq: Once | INTRAVENOUS | Status: AC | PRN
  Administered 2019-04-01: 500 [IU]
  Filled 2019-04-01: qty 5

## 2019-04-01 MED ORDER — PALONOSETRON HCL INJECTION 0.25 MG/5ML
0.2500 mg | Freq: Once | INTRAVENOUS | Status: AC
  Administered 2019-04-01: 0.25 mg via INTRAVENOUS

## 2019-04-01 MED ORDER — PALONOSETRON HCL INJECTION 0.25 MG/5ML
INTRAVENOUS | Status: AC
  Filled 2019-04-01: qty 5

## 2019-04-01 MED ORDER — POTASSIUM CHLORIDE 2 MEQ/ML IV SOLN
Freq: Once | INTRAVENOUS | Status: AC
  Filled 2019-04-01: qty 10

## 2019-04-01 MED ORDER — SODIUM CHLORIDE 0.9 % IV SOLN
25.0000 mg/m2 | Freq: Once | INTRAVENOUS | Status: AC
  Administered 2019-04-01: 62 mg via INTRAVENOUS
  Filled 2019-04-01: qty 62

## 2019-04-01 MED ORDER — ATROPINE SULFATE 1 MG/ML IJ SOLN
INTRAMUSCULAR | Status: AC
  Filled 2019-04-01: qty 1

## 2019-04-01 MED ORDER — ATROPINE SULFATE 1 MG/ML IJ SOLN
0.5000 mg | Freq: Once | INTRAMUSCULAR | Status: AC | PRN
  Administered 2019-04-01: 0.5 mg via INTRAVENOUS

## 2019-04-01 MED ORDER — SODIUM CHLORIDE 0.9 % IV SOLN
Freq: Once | INTRAVENOUS | Status: AC
  Filled 2019-04-01: qty 250

## 2019-04-01 NOTE — Progress Notes (Signed)
Mg = 1.5 MD would like pt to receive Mg Sulf 4 g in her hydration fluids today. Order updated.  Kennith Center, Pharm.D., CPP 04/01/2019@9 :50 AM

## 2019-04-01 NOTE — Progress Notes (Signed)
Per Dr. Julien Nordmann patient is OK to treat with creatinine of 1.51  Per Dr. Julien Nordmann Patient is OK to proceed to with UO of 168ml

## 2019-04-02 ENCOUNTER — Inpatient Hospital Stay: Payer: Medicaid Other

## 2019-04-02 VITALS — BP 137/89 | HR 79 | Temp 98.3°F | Resp 16

## 2019-04-02 DIAGNOSIS — Z5111 Encounter for antineoplastic chemotherapy: Secondary | ICD-10-CM | POA: Diagnosis not present

## 2019-04-02 DIAGNOSIS — C3412 Malignant neoplasm of upper lobe, left bronchus or lung: Secondary | ICD-10-CM

## 2019-04-02 MED ORDER — PEGFILGRASTIM-CBQV 6 MG/0.6ML ~~LOC~~ SOSY
6.0000 mg | PREFILLED_SYRINGE | Freq: Once | SUBCUTANEOUS | Status: AC
  Administered 2019-04-02: 6 mg via SUBCUTANEOUS

## 2019-04-02 MED ORDER — PEGFILGRASTIM-CBQV 6 MG/0.6ML ~~LOC~~ SOSY
PREFILLED_SYRINGE | SUBCUTANEOUS | Status: AC
  Filled 2019-04-02: qty 0.6

## 2019-04-02 NOTE — Patient Instructions (Signed)

## 2019-04-14 ENCOUNTER — Telehealth: Payer: Self-pay | Admitting: Medical Oncology

## 2019-04-14 NOTE — Telephone Encounter (Signed)
Generalized Pain -very bad over the weekend. Was told by on call to go to ED-pt declined. Pain is somewhat better this am except in Left arm. Her pain started below the waist then upper body described as "bones hurt". I told daughter it may be delayed effect of Neulasta from the 16th and to call me back after she wakes up with an update. Marland Kitchen

## 2019-04-15 ENCOUNTER — Encounter: Payer: Self-pay | Admitting: Internal Medicine

## 2019-04-15 ENCOUNTER — Inpatient Hospital Stay: Payer: Medicaid Other

## 2019-04-15 ENCOUNTER — Other Ambulatory Visit: Payer: Self-pay

## 2019-04-15 ENCOUNTER — Other Ambulatory Visit: Payer: Self-pay | Admitting: Internal Medicine

## 2019-04-15 ENCOUNTER — Encounter: Payer: Self-pay | Admitting: *Deleted

## 2019-04-15 ENCOUNTER — Other Ambulatory Visit: Payer: Self-pay | Admitting: Medical Oncology

## 2019-04-15 ENCOUNTER — Other Ambulatory Visit: Payer: Self-pay | Admitting: *Deleted

## 2019-04-15 ENCOUNTER — Inpatient Hospital Stay (HOSPITAL_BASED_OUTPATIENT_CLINIC_OR_DEPARTMENT_OTHER): Payer: Medicaid Other | Admitting: Internal Medicine

## 2019-04-15 VITALS — BP 124/93 | HR 113 | Temp 98.2°F | Resp 18 | Ht 67.0 in | Wt 265.8 lb

## 2019-04-15 DIAGNOSIS — D649 Anemia, unspecified: Secondary | ICD-10-CM

## 2019-04-15 DIAGNOSIS — Z5111 Encounter for antineoplastic chemotherapy: Secondary | ICD-10-CM | POA: Diagnosis not present

## 2019-04-15 DIAGNOSIS — C3412 Malignant neoplasm of upper lobe, left bronchus or lung: Secondary | ICD-10-CM

## 2019-04-15 DIAGNOSIS — C3492 Malignant neoplasm of unspecified part of left bronchus or lung: Secondary | ICD-10-CM

## 2019-04-15 DIAGNOSIS — Z95828 Presence of other vascular implants and grafts: Secondary | ICD-10-CM

## 2019-04-15 LAB — CBC WITH DIFFERENTIAL (CANCER CENTER ONLY)
Abs Immature Granulocytes: 0.01 10*3/uL (ref 0.00–0.07)
Basophils Absolute: 0 10*3/uL (ref 0.0–0.1)
Basophils Relative: 0 %
Eosinophils Absolute: 0 10*3/uL (ref 0.0–0.5)
Eosinophils Relative: 0 %
HCT: 21.7 % — ABNORMAL LOW (ref 36.0–46.0)
Hemoglobin: 7.3 g/dL — ABNORMAL LOW (ref 12.0–15.0)
Immature Granulocytes: 0 %
Lymphocytes Relative: 13 %
Lymphs Abs: 0.5 10*3/uL — ABNORMAL LOW (ref 0.7–4.0)
MCH: 29.6 pg (ref 26.0–34.0)
MCHC: 33.6 g/dL (ref 30.0–36.0)
MCV: 87.9 fL (ref 80.0–100.0)
Monocytes Absolute: 0.4 10*3/uL (ref 0.1–1.0)
Monocytes Relative: 12 %
Neutro Abs: 2.8 10*3/uL (ref 1.7–7.7)
Neutrophils Relative %: 75 %
Platelet Count: 83 10*3/uL — ABNORMAL LOW (ref 150–400)
RBC: 2.47 MIL/uL — ABNORMAL LOW (ref 3.87–5.11)
RDW: 19.6 % — ABNORMAL HIGH (ref 11.5–15.5)
WBC Count: 3.8 10*3/uL — ABNORMAL LOW (ref 4.0–10.5)
nRBC: 0 % (ref 0.0–0.2)

## 2019-04-15 LAB — CMP (CANCER CENTER ONLY)
ALT: 9 U/L (ref 0–44)
AST: 7 U/L — ABNORMAL LOW (ref 15–41)
Albumin: 3.1 g/dL — ABNORMAL LOW (ref 3.5–5.0)
Alkaline Phosphatase: 93 U/L (ref 38–126)
Anion gap: 10 (ref 5–15)
BUN: 16 mg/dL (ref 6–20)
CO2: 24 mmol/L (ref 22–32)
Calcium: 8.4 mg/dL — ABNORMAL LOW (ref 8.9–10.3)
Chloride: 106 mmol/L (ref 98–111)
Creatinine: 1.59 mg/dL — ABNORMAL HIGH (ref 0.44–1.00)
GFR, Est AFR Am: 41 mL/min — ABNORMAL LOW (ref >60)
GFR, Estimated: 35 mL/min — ABNORMAL LOW (ref >60)
Glucose, Bld: 116 mg/dL — ABNORMAL HIGH (ref 70–99)
Potassium: 3.5 mmol/L (ref 3.5–5.1)
Sodium: 140 mmol/L (ref 135–145)
Total Bilirubin: 1 mg/dL (ref 0.3–1.2)
Total Protein: 6.7 g/dL (ref 6.5–8.1)

## 2019-04-15 LAB — MAGNESIUM: Magnesium: 1.4 mg/dL — CL (ref 1.7–2.4)

## 2019-04-15 LAB — PREPARE RBC (CROSSMATCH)

## 2019-04-15 MED ORDER — SODIUM CHLORIDE 0.9% FLUSH
10.0000 mL | INTRAVENOUS | Status: DC | PRN
  Administered 2019-04-15: 08:00:00 10 mL
  Filled 2019-04-15: qty 10

## 2019-04-15 MED ORDER — SODIUM CHLORIDE 0.9 % IV SOLN
Freq: Once | INTRAVENOUS | Status: AC
  Filled 2019-04-15: qty 5

## 2019-04-15 MED ORDER — FENTANYL 25 MCG/HR TD PT72: 1.0000 | MEDICATED_PATCH | TRANSDERMAL | 0 refills | Status: DC

## 2019-04-15 MED ORDER — SODIUM CHLORIDE 0.9% IV SOLUTION
250.0000 mL | Freq: Once | INTRAVENOUS | Status: DC
  Filled 2019-04-15: qty 250

## 2019-04-15 MED ORDER — SODIUM CHLORIDE 0.9% FLUSH
10.0000 mL | INTRAVENOUS | Status: AC | PRN
  Administered 2019-04-15: 10 mL
  Filled 2019-04-15: qty 10

## 2019-04-15 MED ORDER — HEPARIN SOD (PORK) LOCK FLUSH 100 UNIT/ML IV SOLN
500.0000 [IU] | Freq: Every day | INTRAVENOUS | Status: AC | PRN
  Administered 2019-04-15: 500 [IU]
  Filled 2019-04-15: qty 5

## 2019-04-15 MED ORDER — ATROPINE SULFATE 1 MG/ML IJ SOLN
0.5000 mg | Freq: Once | INTRAMUSCULAR | Status: AC | PRN
  Administered 2019-04-15: 0.5 mg via INTRAVENOUS

## 2019-04-15 MED ORDER — SODIUM CHLORIDE 0.9 % IV SOLN
25.0000 mg/m2 | Freq: Once | INTRAVENOUS | Status: AC
  Administered 2019-04-15: 62 mg via INTRAVENOUS
  Filled 2019-04-15: qty 62

## 2019-04-15 MED ORDER — POTASSIUM CHLORIDE 2 MEQ/ML IV SOLN
Freq: Once | INTRAVENOUS | Status: AC
  Filled 2019-04-15: qty 10

## 2019-04-15 MED ORDER — PALONOSETRON HCL INJECTION 0.25 MG/5ML
0.2500 mg | Freq: Once | INTRAVENOUS | Status: AC
  Administered 2019-04-15: 0.25 mg via INTRAVENOUS

## 2019-04-15 MED ORDER — ACETAMINOPHEN 325 MG PO TABS: 650.0000 mg | ORAL_TABLET | Freq: Once | ORAL | Status: DC

## 2019-04-15 MED ORDER — ATROPINE SULFATE 1 MG/ML IJ SOLN
INTRAMUSCULAR | Status: AC
  Filled 2019-04-15: qty 1

## 2019-04-15 MED ORDER — IRINOTECAN HCL CHEMO INJECTION 100 MG/5ML
50.0000 mg/m2 | Freq: Once | INTRAVENOUS | Status: AC
  Administered 2019-04-15: 120 mg via INTRAVENOUS
  Filled 2019-04-15: qty 6

## 2019-04-15 MED ORDER — DIPHENHYDRAMINE HCL 25 MG PO CAPS: 25.0000 mg | ORAL_CAPSULE | Freq: Once | ORAL | Status: DC

## 2019-04-15 MED ORDER — SODIUM CHLORIDE 0.9% FLUSH
10.0000 mL | INTRAVENOUS | Status: DC | PRN
  Filled 2019-04-15: qty 10

## 2019-04-15 MED ORDER — PALONOSETRON HCL INJECTION 0.25 MG/5ML
INTRAVENOUS | Status: AC
  Filled 2019-04-15: qty 5

## 2019-04-15 MED ORDER — HEPARIN SOD (PORK) LOCK FLUSH 100 UNIT/ML IV SOLN
500.0000 [IU] | Freq: Once | INTRAVENOUS | Status: DC | PRN
  Filled 2019-04-15: qty 5

## 2019-04-15 MED ORDER — SODIUM CHLORIDE 0.9 % IV SOLN
Freq: Once | INTRAVENOUS | Status: AC
  Filled 2019-04-15: qty 250

## 2019-04-15 NOTE — Patient Instructions (Signed)

## 2019-04-15 NOTE — Progress Notes (Signed)
Per [facility] please transfuse one unit of blood today . May give post hydration fluids with Cisplatin and  It is okay to give cisplatin and irinotecan with todays creatinine result.

## 2019-04-15 NOTE — Progress Notes (Signed)
Per Dr Julien Nordmann it is okay to treat pt today with cisplatin and irinotecan and todays plts count. Priority it to receive chemotherapy and mag today and blood transfusion tomorrow .

## 2019-04-15 NOTE — Progress Notes (Signed)
Mg = 1.4 today MD would like Mg Sulf 4 g IV in her IVF today. Orders adjusted accordingly.  Kennith Center, Pharm.D., CPP 04/15/2019@9 :57 AM

## 2019-04-15 NOTE — Progress Notes (Signed)
Per Dr Julien Nordmann it is okay to treat pt today with chemotherapy and pulse of 113.

## 2019-04-15 NOTE — Patient Instructions (Signed)
[facility] Discharge Instructions for Patients Receiving Chemotherapy  Today you received the following chemotherapy agents: Irinotecan, Cisplatin  To help prevent nausea and vomiting after your treatment, we encourage you to take your nausea medication as directed.   If you develop nausea and vomiting that is not controlled by your nausea medication, call the clinic.   BELOW ARE SYMPTOMS THAT SHOULD BE REPORTED IMMEDIATELY:  *FEVER GREATER THAN 100.5 F  *CHILLS WITH OR WITHOUT FEVER  NAUSEA AND VOMITING THAT IS NOT CONTROLLED WITH YOUR NAUSEA MEDICATION  *UNUSUAL SHORTNESS OF BREATH  *UNUSUAL BRUISING OR BLEEDING  TENDERNESS IN MOUTH AND THROAT WITH OR WITHOUT PRESENCE OF ULCERS  *URINARY PROBLEMS  *BOWEL PROBLEMS  UNUSUAL RASH Items with * indicate a potential emergency and should be followed up as soon as possible.  Feel free to call the clinic should you have any questions or concerns. The clinic phone number is [phone].  Please show the [facility] at check-in to the Emergency Department and triage nurse.

## 2019-04-15 NOTE — Progress Notes (Signed)
[facility] Telephone:([phone]) [phone]   Fax:(336) [phone]  OFFICE PROGRESS NOTE  Samantha Blackbird, MD [place] [address]  DIAGNOSIS: Stage IV (T2 a, N3, M1 C) high-grade neuroendocrine carcinoma with focal areas of small cell carcinoma diagnosed in January 2020.  PRIOR THERAPY:  1) Carboplatin for AUC of 5 on day 1, etoposide 100 mg/M2 on days 1, 2 and 3 with Neulasta support in addition to [place].First dose February 10th, 2020. Status post 4 cycles. 2) Maintenance treatment with Tecentriq 1200 mg IV every 3 weeks.  First dose Sep 10, 2018.  Status post 3 cycles.  This was discontinued secondary to disease progression.   CURRENT THERAPY:  Systemic chemotherapy with cisplatin 30 mg/M2 and irinotecan 65 mg/M2 on days 1 and 8 every 3 weeks.  First dose October 28, 2018.  Status post 6 cycles.  INTERVAL HISTORY: Samantha Santos 58 y.o. female returns to the clinic today for follow-up visit.  The patient continues to complain of pain in the left shoulder area as well as increasing fatigue.  She also has some shortness of breath with exertion but no cough or hemoptysis.  She denied having any nausea, vomiting, diarrhea or constipation.  She has no headache or visual changes.  She lost few pounds since her last visit.  The patient is here today for evaluation before starting cycle #7 of her treatment.  MEDICAL HISTORY: Past Medical History:  Diagnosis Date  . Anemia   . Arthritis   . GERD (gastroesophageal reflux disease)   . Hypertension   . Morbid obesity ([place]) 05/06/2018  . Supraclavicular adenopathy     ALLERGIES:  is allergic to penicillins and oxycodone.  MEDICATIONS:  Current Outpatient Medications  Medication Sig Dispense Refill  . acetaminophen (TYLENOL) 500 MG tablet Take 500 mg by mouth every 6 (six) hours as needed.    . diphenoxylate-atropine (LOMOTIL) 2.5-0.025 MG tablet 1 to 2 tablets 4 times daily as needed for diarrhea 40 tablet  1  . gabapentin (NEURONTIN) 100 MG capsule Take 1 capsule (100 mg total) by mouth at bedtime. 30 capsule 2  . HYDROcodone-acetaminophen (NORCO) 5-325 MG tablet Take 1 tablet by mouth every 6 (six) hours as needed for severe pain. 30 tablet 0  . lidocaine-prilocaine (EMLA) cream Apply 1 application topically as needed (chemo port). 30 g 0  . loratadine (CLARITIN) 10 MG tablet Take 10 mg by mouth daily.    Marland Kitchen LORazepam (ATIVAN) 0.5 MG tablet Place 1 tablet (0.5 mg total) under the tongue every 6 (six) hours as needed (Nausea). 30 tablet 1  . losartan-hydrochlorothiazide (HYZAAR) 100-12.5 MG tablet Take 1 tablet by mouth daily.    . magnesium oxide (MAG-OX) 400 (241.3 Mg) MG tablet Take 1 tablet (400 mg total) by mouth 3 (three) times daily. 90 tablet 0  . prochlorperazine (COMPAZINE) 10 MG tablet Take 1 tablet (10 mg total) by mouth every 6 (six) hours as needed for nausea or vomiting. 50 tablet 3  . rivaroxaban (XARELTO) 20 MG TABS tablet Take 1 tablet (20 mg total) by mouth daily with supper. 30 tablet 2  . traMADol (ULTRAM) 50 MG tablet Take 1 tablet (50 mg total) by mouth every 6 (six) hours as needed. (Patient not taking: Reported on 03/11/2019) 20 tablet 0   No current facility-administered medications for this visit.   Facility-Administered Medications Ordered in Other Visits  Medication Dose Route Frequency Provider Last Rate Last Admin  . sodium chloride flush (  NS) 0.9 % injection 10 mL  10 mL Intracatheter PRN Curt Bears, MD   10 mL at 04/15/19 0813    SURGICAL HISTORY:  Past Surgical History:  Procedure Laterality Date  . CESAREAN SECTION  1988  . HYSTEROSCOPY WITH D & C  06/14/2011   Procedure: DILATATION AND CURETTAGE /HYSTEROSCOPY;  Surgeon: Frederico Hamman, MD;  Location: [facility];  Service: Gynecology;  Laterality: N/A;  . IR CV LINE INJECTION  06/25/2018  . PORTACATH PLACEMENT Left 06/13/2018   Procedure: INSERTION PORT-A-CATH;  Surgeon: [name], MD;   Location: [facility];  Service: Thoracic;  Laterality: Left;  . SUPRACLAVICAL NODE BIOPSY Right 05/10/2018   Procedure: SUPRACLAVICAL NODE BIOPSY;  Surgeon: [name], MD;  Location: [facility];  Service: Thoracic;  Laterality: Right;  . TUBAL LIGATION      REVIEW OF SYSTEMS:  Constitutional: positive for fatigue and weight loss Eyes: negative Ears, nose, mouth, throat, and face: negative Respiratory: positive for pleurisy/chest pain Cardiovascular: negative Gastrointestinal: negative Genitourinary:negative Integument/breast: negative Hematologic/lymphatic: negative Musculoskeletal:positive for bone pain and muscle weakness Neurological: negative Behavioral/Psych: negative Endocrine: negative Allergic/Immunologic: negative   PHYSICAL EXAMINATION: General appearance: alert, cooperative, fatigued and no distress Head: Normocephalic, without obvious abnormality, atraumatic Neck: no adenopathy, no JVD, supple, symmetrical, trachea midline and thyroid not enlarged, symmetric, no tenderness/mass/nodules Lymph nodes: Cervical, supraclavicular, and axillary nodes normal. Resp: clear to auscultation bilaterally Back: symmetric, no curvature. ROM normal. No CVA tenderness. Cardio: regular rate and rhythm, S1, S2 normal, no murmur, click, rub or gallop GI: soft, non-tender; bowel sounds normal; no masses,  no organomegaly Extremities: extremities normal, atraumatic, no cyanosis or edema Neurologic: Mental status: Alert, oriented, thought content appropriate  ECOG PERFORMANCE STATUS: 1 - Symptomatic but completely ambulatory  Blood pressure (!) 124/93, pulse (!) 113, temperature 98.2 F (36.8 C), temperature source Oral, resp. rate 18, height 5\' 7"  (1.702 m), weight 265 lb 12.8 oz (120.6 kg), SpO2 100 %.  LABORATORY DATA: Lab Results  Component Value Date   WBC 4.1 04/01/2019   HGB 8.7 (L) 04/01/2019   HCT 26.1 (L) 04/01/2019   MCV 86.7 04/01/2019   PLT 120 (L) 04/01/2019       Chemistry      Component Value Date/Time   NA 141 04/01/2019 0814   NA 144 04/08/2018 1145   K 3.6 04/01/2019 0814   CL 105 04/01/2019 0814   CO2 24 04/01/2019 0814   BUN 18 04/01/2019 0814   BUN 10 04/08/2018 1145   CREATININE 1.51 (H) 04/01/2019 0814      Component Value Date/Time   CALCIUM 8.4 (L) 04/01/2019 0814   ALKPHOS 79 04/01/2019 0814   AST <6 (L) 04/01/2019 0814   ALT 8 04/01/2019 0814   BILITOT 0.6 04/01/2019 0814       RADIOGRAPHIC STUDIES: No results found.  ASSESSMENT AND PLAN: This is a very pleasant 58 years old African-American female with stage IV high-grade neuroendocrine carcinoma and underwent systemic chemotherapy with carboplatin and etoposide and Tecentriq status post 7 cycles. Starting from cycle #5 the patient has been on treatment with maintenance Tecentriq status post 3 cycles. This treatment was discontinued secondary to disease progression. The patient is currently on treatment with systemic chemotherapy with cisplatin 30 mg/M2 and irinotecan 65 mg/M2 on days 1 and 8 every 3 weeks status post 6 cycles. She has been tolerating this treatment well except for fatigue.  She also has left shoulder pain. Her CBC today showed persistent chemotherapy-induced anemia  as well as mild thrombocytopenia. I recommended for the patient to proceed with cycle #7 today as planned. For the chemotherapy-induced anemia, I will arrange for the patient to receive 2 units of PRBCs transfusion in the next few days. I will see the patient back for follow-up visit in 3 weeks with repeat CT scan of the chest, abdomen pelvis for restaging of her disease. For pain management I will add fentanyl patch 25 mcg/hour every 3 days in addition to oxycodone for the breakthrough pain. The patient will come back for follow-up visit in 3 weeks for evaluation before the next cycle of her treatment. She was advised to call immediately if she has any concerning symptoms in the interval. All  questions were answered. The patient knows to call the clinic with any problems, questions or concerns. We can certainly see the patient much sooner if necessary.  Disclaimer: This note was dictated with voice recognition software. Similar sounding words can inadvertently be transcribed and may not be corrected upon review.

## 2019-04-16 ENCOUNTER — Ambulatory Visit: Payer: Medicaid Other

## 2019-04-16 ENCOUNTER — Inpatient Hospital Stay: Payer: Medicaid Other

## 2019-04-16 ENCOUNTER — Other Ambulatory Visit: Payer: Self-pay

## 2019-04-16 DIAGNOSIS — Z5111 Encounter for antineoplastic chemotherapy: Secondary | ICD-10-CM | POA: Diagnosis not present

## 2019-04-16 DIAGNOSIS — D649 Anemia, unspecified: Secondary | ICD-10-CM

## 2019-04-16 LAB — PREPARE RBC (CROSSMATCH)

## 2019-04-16 MED ORDER — DIPHENHYDRAMINE HCL 25 MG PO CAPS
25.0000 mg | ORAL_CAPSULE | Freq: Once | ORAL | Status: AC
  Administered 2019-04-16: 25 mg via ORAL

## 2019-04-16 MED ORDER — ACETAMINOPHEN 325 MG PO TABS
ORAL_TABLET | ORAL | Status: AC
  Filled 2019-04-16: qty 2

## 2019-04-16 MED ORDER — SODIUM CHLORIDE 0.9% IV SOLUTION
250.0000 mL | Freq: Once | INTRAVENOUS | Status: AC
  Administered 2019-04-16: 250 mL via INTRAVENOUS
  Filled 2019-04-16: qty 250

## 2019-04-16 MED ORDER — ACETAMINOPHEN 325 MG PO TABS
650.0000 mg | ORAL_TABLET | Freq: Once | ORAL | Status: AC
  Administered 2019-04-16: 08:00:00 650 mg via ORAL

## 2019-04-16 MED ORDER — HEPARIN SOD (PORK) LOCK FLUSH 100 UNIT/ML IV SOLN
500.0000 [IU] | Freq: Every day | INTRAVENOUS | Status: AC | PRN
  Administered 2019-04-16: 500 [IU]
  Filled 2019-04-16: qty 5

## 2019-04-16 MED ORDER — SODIUM CHLORIDE 0.9% FLUSH
10.0000 mL | INTRAVENOUS | Status: AC | PRN
  Administered 2019-04-16: 10 mL
  Filled 2019-04-16: qty 10

## 2019-04-16 MED ORDER — DIPHENHYDRAMINE HCL 25 MG PO CAPS
ORAL_CAPSULE | ORAL | Status: AC
  Filled 2019-04-16: qty 1

## 2019-04-16 NOTE — Patient Instructions (Signed)
Blood Transfusion, Adult, Care After This sheet gives you information about how to care for yourself after your procedure. Your doctor may also give you more specific instructions. If you have problems or questions, contact your doctor. Follow these instructions at home:   Take over-the-counter and prescription medicines only as told by your doctor.  Go back to your normal activities as told by your doctor.  Follow instructions from your doctor about how to take care of the area where an IV tube was put into your vein (insertion site). Make sure you: ? Wash your hands with soap and water before you change your bandage (dressing). If there is no soap and water, use hand sanitizer. ? Change your bandage as told by your doctor.  Check your IV insertion site every day for signs of infection. Check for: ? More redness, swelling, or pain. ? More fluid or blood. ? Warmth. ? Pus or a bad smell. Contact a doctor if:  You have more redness, swelling, or pain around the IV insertion site.  You have more fluid or blood coming from the IV insertion site.  Your IV insertion site feels warm to the touch.  You have pus or a bad smell coming from the IV insertion site.  Your pee (urine) turns pink, red, or brown.  You feel weak after doing your normal activities. Get help right away if:  You have signs of a serious allergic or body defense (immune) system reaction, including: ? Itchiness. ? Hives. ? Trouble breathing. ? Anxiety. ? Pain in your chest or lower back. ? Fever, flushing, and chills. ? Fast pulse. ? Rash. ? Watery poop (diarrhea). ? Throwing up (vomiting). ? Dark pee. ? Serious headache. ? Dizziness. ? Stiff neck. ? Yellow color in your face or the white parts of your eyes (jaundice). Summary  After a blood transfusion, return to your normal activities as told by your doctor.  Every day, check for signs of infection where the IV tube was put into your vein.  Some  signs of infection are warm skin, more redness and pain, more fluid or blood, and pus or a bad smell where the needle went in.  Contact your doctor if you feel weak or have any unusual symptoms. This information is not intended to replace advice given to you by your health care provider. Make sure you discuss any questions you have with your health care provider. Document Released: 04/24/2014 Document Revised: 08/08/2017 Document Reviewed: 11/26/2015 Elsevier Patient Education  2020 Elsevier Inc.  

## 2019-04-17 LAB — TYPE AND SCREEN
ABO/RH(D): O POS
Antibody Screen: NEGATIVE
Unit division: 0
Unit division: 0

## 2019-04-17 LAB — BPAM RBC
Blood Product Expiration Date: 202101292359
Blood Product Expiration Date: 202101302359
ISSUE DATE / TIME: 202012300807
ISSUE DATE / TIME: 202012300807
Unit Type and Rh: 5100
Unit Type and Rh: 5100

## 2019-04-22 ENCOUNTER — Other Ambulatory Visit: Payer: Self-pay

## 2019-04-22 ENCOUNTER — Inpatient Hospital Stay: Payer: Medicaid Other

## 2019-04-22 ENCOUNTER — Inpatient Hospital Stay: Payer: Medicaid Other | Attending: Internal Medicine

## 2019-04-22 ENCOUNTER — Other Ambulatory Visit: Payer: Self-pay | Admitting: Physician Assistant

## 2019-04-22 ENCOUNTER — Other Ambulatory Visit: Payer: Self-pay | Admitting: *Deleted

## 2019-04-22 VITALS — BP 137/88 | HR 77 | Temp 97.6°F | Resp 16

## 2019-04-22 DIAGNOSIS — C787 Secondary malignant neoplasm of liver and intrahepatic bile duct: Secondary | ICD-10-CM | POA: Diagnosis not present

## 2019-04-22 DIAGNOSIS — D649 Anemia, unspecified: Secondary | ICD-10-CM

## 2019-04-22 DIAGNOSIS — C7A1 Malignant poorly differentiated neuroendocrine tumors: Secondary | ICD-10-CM | POA: Insufficient documentation

## 2019-04-22 DIAGNOSIS — Z95828 Presence of other vascular implants and grafts: Secondary | ICD-10-CM

## 2019-04-22 DIAGNOSIS — C3412 Malignant neoplasm of upper lobe, left bronchus or lung: Secondary | ICD-10-CM

## 2019-04-22 DIAGNOSIS — K219 Gastro-esophageal reflux disease without esophagitis: Secondary | ICD-10-CM | POA: Diagnosis not present

## 2019-04-22 DIAGNOSIS — C771 Secondary and unspecified malignant neoplasm of intrathoracic lymph nodes: Secondary | ICD-10-CM | POA: Insufficient documentation

## 2019-04-22 DIAGNOSIS — C3490 Malignant neoplasm of unspecified part of unspecified bronchus or lung: Secondary | ICD-10-CM | POA: Insufficient documentation

## 2019-04-22 DIAGNOSIS — R5383 Other fatigue: Secondary | ICD-10-CM | POA: Insufficient documentation

## 2019-04-22 DIAGNOSIS — Z86711 Personal history of pulmonary embolism: Secondary | ICD-10-CM | POA: Diagnosis not present

## 2019-04-22 DIAGNOSIS — Z7901 Long term (current) use of anticoagulants: Secondary | ICD-10-CM | POA: Insufficient documentation

## 2019-04-22 DIAGNOSIS — Z5111 Encounter for antineoplastic chemotherapy: Secondary | ICD-10-CM

## 2019-04-22 DIAGNOSIS — Z79899 Other long term (current) drug therapy: Secondary | ICD-10-CM | POA: Diagnosis not present

## 2019-04-22 DIAGNOSIS — I10 Essential (primary) hypertension: Secondary | ICD-10-CM | POA: Insufficient documentation

## 2019-04-22 DIAGNOSIS — D61810 Antineoplastic chemotherapy induced pancytopenia: Secondary | ICD-10-CM | POA: Diagnosis not present

## 2019-04-22 DIAGNOSIS — C3492 Malignant neoplasm of unspecified part of left bronchus or lung: Secondary | ICD-10-CM

## 2019-04-22 LAB — CBC WITH DIFFERENTIAL (CANCER CENTER ONLY)
Abs Immature Granulocytes: 0 10*3/uL (ref 0.00–0.07)
Basophils Absolute: 0 10*3/uL (ref 0.0–0.1)
Basophils Relative: 0 %
Eosinophils Absolute: 0 10*3/uL (ref 0.0–0.5)
Eosinophils Relative: 0 %
HCT: 23.4 % — ABNORMAL LOW (ref 36.0–46.0)
Hemoglobin: 7.9 g/dL — ABNORMAL LOW (ref 12.0–15.0)
Immature Granulocytes: 0 %
Lymphocytes Relative: 57 %
Lymphs Abs: 0.6 10*3/uL — ABNORMAL LOW (ref 0.7–4.0)
MCH: 29.3 pg (ref 26.0–34.0)
MCHC: 33.8 g/dL (ref 30.0–36.0)
MCV: 86.7 fL (ref 80.0–100.0)
Monocytes Absolute: 0.1 10*3/uL (ref 0.1–1.0)
Monocytes Relative: 9 %
Neutro Abs: 0.4 10*3/uL — CL (ref 1.7–7.7)
Neutrophils Relative %: 34 %
Platelet Count: 58 10*3/uL — ABNORMAL LOW (ref 150–400)
RBC: 2.7 MIL/uL — ABNORMAL LOW (ref 3.87–5.11)
RDW: 17.2 % — ABNORMAL HIGH (ref 11.5–15.5)
WBC Count: 1.1 10*3/uL — ABNORMAL LOW (ref 4.0–10.5)
nRBC: 0 % (ref 0.0–0.2)

## 2019-04-22 LAB — CMP (CANCER CENTER ONLY)
ALT: 17 U/L (ref 0–44)
AST: 9 U/L — ABNORMAL LOW (ref 15–41)
Albumin: 3 g/dL — ABNORMAL LOW (ref 3.5–5.0)
Alkaline Phosphatase: 73 U/L (ref 38–126)
Anion gap: 8 (ref 5–15)
BUN: 24 mg/dL — ABNORMAL HIGH (ref 6–20)
CO2: 26 mmol/L (ref 22–32)
Calcium: 8.6 mg/dL — ABNORMAL LOW (ref 8.9–10.3)
Chloride: 105 mmol/L (ref 98–111)
Creatinine: 1.73 mg/dL — ABNORMAL HIGH (ref 0.44–1.00)
GFR, Est AFR Am: 37 mL/min — ABNORMAL LOW (ref >60)
GFR, Estimated: 32 mL/min — ABNORMAL LOW (ref >60)
Glucose, Bld: 131 mg/dL — ABNORMAL HIGH (ref 70–99)
Potassium: 3.5 mmol/L (ref 3.5–5.1)
Sodium: 139 mmol/L (ref 135–145)
Total Bilirubin: 1 mg/dL (ref 0.3–1.2)
Total Protein: 6.3 g/dL — ABNORMAL LOW (ref 6.5–8.1)

## 2019-04-22 LAB — PREPARE RBC (CROSSMATCH)

## 2019-04-22 LAB — MAGNESIUM: Magnesium: 1.3 mg/dL — ABNORMAL LOW (ref 1.7–2.4)

## 2019-04-22 MED ORDER — SODIUM CHLORIDE 0.9 % IV SOLN
Freq: Once | INTRAVENOUS | Status: DC
  Filled 2019-04-22: qty 1000

## 2019-04-22 MED ORDER — TBO-FILGRASTIM 480 MCG/0.8ML ~~LOC~~ SOSY
480.0000 ug | PREFILLED_SYRINGE | Freq: Once | SUBCUTANEOUS | Status: AC
  Administered 2019-04-22: 13:00:00 480 ug via SUBCUTANEOUS

## 2019-04-22 MED ORDER — SODIUM CHLORIDE 0.9% FLUSH
10.0000 mL | INTRAVENOUS | Status: DC | PRN
  Administered 2019-04-22: 10 mL
  Filled 2019-04-22: qty 10

## 2019-04-22 MED ORDER — MAGNESIUM SULFATE 4 GM/100ML IV SOLN
4.0000 g | Freq: Once | INTRAVENOUS | Status: AC
  Administered 2019-04-22: 11:00:00 4 g via INTRAVENOUS
  Filled 2019-04-22: qty 100

## 2019-04-22 MED ORDER — HEPARIN SOD (PORK) LOCK FLUSH 100 UNIT/ML IV SOLN
500.0000 [IU] | Freq: Once | INTRAVENOUS | Status: AC | PRN
  Administered 2019-04-22: 17:00:00 500 [IU]
  Filled 2019-04-22: qty 5

## 2019-04-22 MED ORDER — ACETAMINOPHEN 325 MG PO TABS
ORAL_TABLET | ORAL | Status: AC
  Filled 2019-04-22: qty 2

## 2019-04-22 MED ORDER — SODIUM CHLORIDE 0.9% FLUSH
10.0000 mL | INTRAVENOUS | Status: DC | PRN
  Administered 2019-04-22: 17:00:00 10 mL
  Filled 2019-04-22: qty 10

## 2019-04-22 MED ORDER — SODIUM CHLORIDE 0.9% IV SOLUTION
250.0000 mL | Freq: Once | INTRAVENOUS | Status: AC
  Administered 2019-04-22: 250 mL via INTRAVENOUS
  Filled 2019-04-22: qty 250

## 2019-04-22 MED ORDER — SODIUM CHLORIDE 0.9 % IV SOLN
1000.0000 mL | Freq: Once | INTRAVENOUS | Status: AC
  Administered 2019-04-22: 11:00:00 1000 mL via INTRAVENOUS
  Filled 2019-04-22: qty 1000

## 2019-04-22 MED ORDER — ACETAMINOPHEN 325 MG PO TABS
650.0000 mg | ORAL_TABLET | Freq: Once | ORAL | Status: AC
  Administered 2019-04-22: 13:00:00 650 mg via ORAL

## 2019-04-22 MED ORDER — DIPHENHYDRAMINE HCL 25 MG PO CAPS
25.0000 mg | ORAL_CAPSULE | Freq: Once | ORAL | Status: AC
  Administered 2019-04-22: 25 mg via ORAL

## 2019-04-22 MED ORDER — TBO-FILGRASTIM 480 MCG/0.8ML ~~LOC~~ SOSY
PREFILLED_SYRINGE | SUBCUTANEOUS | Status: AC
  Filled 2019-04-22: qty 0.8

## 2019-04-22 MED ORDER — DIPHENHYDRAMINE HCL 25 MG PO CAPS
ORAL_CAPSULE | ORAL | Status: AC
  Filled 2019-04-22: qty 1

## 2019-04-22 NOTE — Patient Instructions (Signed)

## 2019-04-22 NOTE — Progress Notes (Signed)
Nutrition Assessment   Reason for Assessment:  Patient identified on Malnutrition screening report for weight loss   ASSESSMENT:  59 year old female with stage IV neuroendocrine carcinoma with focal areas of small cell carcinoma.  Patient receiving cisplatin and irinotecan.  Past medical history of GERD, arthritis, HTN. No plans for treatment today.     Met with patient in infusion to introduce self and service at [facility].  Patient reports poor appetite, no taste for food and abdominal pain/diarrhea.  Patient reports that she normally eats sausage biscuit or chicken biscuit for breakfast but this seems to upset her stomach.  Reports that she is lactose intolerant.  Has not tried lactaid milk or alternatives.  Has not tried oral nutrition supplements.     Medications: lomotil, ativan, compazine   Labs: reviewed   Anthropometrics:   Height: 67 inches Weight: 265 lb 12. 8 oz on 12/29 Sept 29/2020 noted weight of 282 lb BMI: 41  6% weight loss in the last 3 months, concerning   NUTRITION DIAGNOSIS: Inadequate oral intake related to cancer and cancer related treatment side effects as evidenced by 6% weight loss in the last 3 months   INTERVENTION:  Discussed strategies to help with diarrhea.  Provided Diarrhea Nutrition Therapy handout from [facility] to patient.   Provided oral nutrition supplement samples (ensure clear, boost soothe and orgain dairy free).   Encouraged small frequent meals, setting schedule to eat.  Contact information provided to patient   MONITORING, EVALUATION, GOAL: Patient will consume adequate calories and protein to maintain muscle mass   Next Visit: Jan 26 during infusion  Samantha Santos B. Zenia Resides, [place], [place] Registered Dietitian [phone] (pager)

## 2019-04-22 NOTE — Progress Notes (Signed)
Per Cassandra Heilingoetter, PA no chemo treatment today.  Needs Type and Screen, 2 units of PRBC and first of 3 daily injections for ANC of 0.4.

## 2019-04-22 NOTE — Patient Instructions (Signed)

## 2019-04-23 ENCOUNTER — Other Ambulatory Visit: Payer: Self-pay

## 2019-04-23 ENCOUNTER — Inpatient Hospital Stay: Payer: Medicaid Other

## 2019-04-23 ENCOUNTER — Ambulatory Visit (HOSPITAL_COMMUNITY)
Admission: RE | Admit: 2019-04-23 | Discharge: 2019-04-23 | Disposition: A | Discharge status: Home / Self Care | Payer: Medicaid Other | Source: Ambulatory Visit | Attending: Internal Medicine | Admitting: Internal Medicine

## 2019-04-23 ENCOUNTER — Encounter (HOSPITAL_COMMUNITY): Payer: Self-pay

## 2019-04-23 VITALS — BP 131/88 | HR 80 | Temp 97.1°F | Resp 16

## 2019-04-23 DIAGNOSIS — Z95828 Presence of other vascular implants and grafts: Secondary | ICD-10-CM

## 2019-04-23 DIAGNOSIS — C3490 Malignant neoplasm of unspecified part of unspecified bronchus or lung: Secondary | ICD-10-CM | POA: Diagnosis not present

## 2019-04-23 DIAGNOSIS — C3412 Malignant neoplasm of upper lobe, left bronchus or lung: Secondary | ICD-10-CM | POA: Insufficient documentation

## 2019-04-23 HISTORY — DX: Malignant (primary) neoplasm, unspecified: C80.1

## 2019-04-23 LAB — BPAM RBC
Blood Product Expiration Date: 202102022359
Blood Product Expiration Date: 202102032359
ISSUE DATE / TIME: 202101051312
ISSUE DATE / TIME: 202101051312
Unit Type and Rh: 5100
Unit Type and Rh: 5100

## 2019-04-23 LAB — TYPE AND SCREEN
ABO/RH(D): O POS
Antibody Screen: NEGATIVE
Unit division: 0
Unit division: 0

## 2019-04-23 MED ORDER — HEPARIN SOD (PORK) LOCK FLUSH 100 UNIT/ML IV SOLN
INTRAVENOUS | Status: AC
  Filled 2019-04-23: qty 5

## 2019-04-23 MED ORDER — HEPARIN SOD (PORK) LOCK FLUSH 100 UNIT/ML IV SOLN
500.0000 [IU] | Freq: Once | INTRAVENOUS | Status: AC
  Administered 2019-04-23: 500 [IU] via INTRAVENOUS

## 2019-04-23 MED ORDER — TBO-FILGRASTIM 480 MCG/0.8ML ~~LOC~~ SOSY
480.0000 ug | PREFILLED_SYRINGE | Freq: Once | SUBCUTANEOUS | Status: AC
  Administered 2019-04-23: 09:00:00 480 ug via SUBCUTANEOUS

## 2019-04-23 MED ORDER — TBO-FILGRASTIM 480 MCG/0.8ML ~~LOC~~ SOSY
PREFILLED_SYRINGE | SUBCUTANEOUS | Status: AC
  Filled 2019-04-23: qty 0.8

## 2019-04-23 MED ORDER — SODIUM CHLORIDE (PF) 0.9 % IJ SOLN
INTRAMUSCULAR | Status: AC
  Filled 2019-04-23: qty 50

## 2019-04-23 MED ORDER — IOHEXOL 300 MG/ML  SOLN
75.0000 mL | Freq: Once | INTRAMUSCULAR | Status: AC | PRN
  Administered 2019-04-23: 75 mL via INTRAVENOUS

## 2019-04-23 NOTE — Patient Instructions (Signed)
Tbo-Filgrastim injection What is this medicine? TBO-FILGRASTIM (T B O fil GRA stim) is a granulocyte colony-stimulating factor that helps you make more neutrophils, a type of white blood cell. Neutrophils are important for fighting infections. Some chemotherapy affects your bone marrow and lowers your neutrophils. This medicine helps decrease the length of time that neutrophils are very low (severe neutropenia). This medicine may be used for other purposes; ask your health care provider or pharmacist if you have questions. COMMON BRAND NAME(S): Granix What should I tell my health care provider before I take this medicine? They need to know if you have any of these conditions:  bone scan or tests planned  kidney disease  sickle cell anemia  an unusual or allergic reaction to tbo-filgrastim, filgrastim, pegfilgrastim, other medicines, foods, dyes, or preservatives  pregnant or trying to get pregnant  breast-feeding How should I use this medicine? This medicine is for injection under the skin. If you get this medicine at home, you will be taught how to prepare and give this medicine. Refer to the Instructions for Use that come with your medication packaging. Use exactly as directed. Take your medicine at regular intervals. Do not take your medicine more often than directed. It is important that you put your used needles and syringes in a special sharps container. Do not put them in a trash can. If you do not have a sharps container, call your pharmacist or healthcare provider to get one. Talk to your pediatrician regarding the use of this medicine in children. While this drug may be prescribed for children as young as 49 month of age for selected conditions, precautions do apply. Overdosage: If you think you have taken too much of this medicine contact a poison control center or emergency room at once. NOTE: This medicine is only for you. Do not share this medicine with others. What if I miss a  dose? It is important not to miss your dose. Call your doctor or health care professional if you miss a dose. What may interact with this medicine? This medicine may interact with the following medications:  medicines that may cause a release of neutrophils, such as lithium This list may not describe all possible interactions. Give your health care provider a list of all the medicines, herbs, non-prescription drugs, or dietary supplements you use. Also tell them if you smoke, drink alcohol, or use illegal drugs. Some items may interact with your medicine. What should I watch for while using this medicine? You may need blood work done while you are taking this medicine. What side effects may I notice from receiving this medicine? Side effects that you should report to your doctor or health care professional as soon as possible:  allergic reactions like skin rash, itching or hives, swelling of the face, lips, or tongue  back pain  blood in the urine  dark urine  dizziness  fast heartbeat  feeling faint  shortness of breath or breathing problems  signs and symptoms of infection like fever or chills; cough; or sore throat  signs and symptoms of kidney injury like trouble passing urine or change in the amount of urine  stomach or side pain, or pain at the shoulder  sweating  swelling of the legs, ankles, or abdomen  tiredness Side effects that usually do not require medical attention (report to your doctor or health care professional if they continue or are bothersome):  bone pain  diarrhea  headache  muscle pain  vomiting This list may  not describe all possible side effects. Call your doctor for medical advice about side effects. You may report side effects to FDA at 1-800-FDA-1088. Where should I keep my medicine? Keep out of the reach of children. Store in a refrigerator between 2 and 8 degrees C (36 and 46 degrees F). Keep in carton to protect from light. Throw  away this medicine if it is left out of the refrigerator for more than 5 consecutive days. Throw away any unused medicine after the expiration date. NOTE: This sheet is a summary. It may not cover all possible information. If you have questions about this medicine, talk to your doctor, pharmacist, or health care provider.  2020 Elsevier/Gold Standard (2018-02-02 19:58:39)

## 2019-04-24 ENCOUNTER — Other Ambulatory Visit: Payer: Self-pay

## 2019-04-24 ENCOUNTER — Inpatient Hospital Stay: Payer: Medicaid Other

## 2019-04-24 VITALS — BP 150/95 | HR 92 | Temp 97.4°F | Resp 16

## 2019-04-24 DIAGNOSIS — C3490 Malignant neoplasm of unspecified part of unspecified bronchus or lung: Secondary | ICD-10-CM | POA: Diagnosis not present

## 2019-04-24 DIAGNOSIS — Z95828 Presence of other vascular implants and grafts: Secondary | ICD-10-CM

## 2019-04-24 MED ORDER — TBO-FILGRASTIM 480 MCG/0.8ML ~~LOC~~ SOSY: 480.0000 ug | PREFILLED_SYRINGE | Freq: Once | SUBCUTANEOUS | Status: DC

## 2019-04-24 MED ORDER — FILGRASTIM-AAFI 480 MCG/0.8ML IJ SOSY
480.0000 ug | PREFILLED_SYRINGE | Freq: Once | INTRAMUSCULAR | Status: AC
  Administered 2019-04-24: 13:00:00 480 ug via SUBCUTANEOUS
  Filled 2019-04-24: qty 0.8

## 2019-04-24 NOTE — Patient Instructions (Signed)

## 2019-04-30 ENCOUNTER — Other Ambulatory Visit: Payer: Self-pay | Admitting: Internal Medicine

## 2019-04-30 ENCOUNTER — Telehealth: Payer: Self-pay | Admitting: Medical Oncology

## 2019-04-30 ENCOUNTER — Encounter: Payer: Self-pay | Admitting: Internal Medicine

## 2019-04-30 ENCOUNTER — Ambulatory Visit: Payer: Medicaid Other | Admitting: Internal Medicine

## 2019-04-30 ENCOUNTER — Other Ambulatory Visit: Payer: Self-pay

## 2019-04-30 ENCOUNTER — Ambulatory Visit (HOSPITAL_COMMUNITY)
Admission: RE | Admit: 2019-04-30 | Discharge: 2019-04-30 | Disposition: A | Discharge status: Home / Self Care | Payer: Medicaid Other | Source: Ambulatory Visit | Attending: Internal Medicine | Admitting: Internal Medicine

## 2019-04-30 VITALS — BP 137/94 | HR 120 | Temp 98.2°F | Ht 67.0 in | Wt 265.1 lb

## 2019-04-30 DIAGNOSIS — C3492 Malignant neoplasm of unspecified part of left bronchus or lung: Secondary | ICD-10-CM

## 2019-04-30 DIAGNOSIS — R000 Tachycardia, unspecified: Secondary | ICD-10-CM

## 2019-04-30 DIAGNOSIS — N182 Chronic kidney disease, stage 2 (mild): Secondary | ICD-10-CM

## 2019-04-30 DIAGNOSIS — I129 Hypertensive chronic kidney disease with stage 1 through stage 4 chronic kidney disease, or unspecified chronic kidney disease: Secondary | ICD-10-CM

## 2019-04-30 DIAGNOSIS — Z79899 Other long term (current) drug therapy: Secondary | ICD-10-CM | POA: Diagnosis not present

## 2019-04-30 DIAGNOSIS — I10 Essential (primary) hypertension: Secondary | ICD-10-CM

## 2019-04-30 DIAGNOSIS — G8929 Other chronic pain: Secondary | ICD-10-CM

## 2019-04-30 DIAGNOSIS — M545 Low back pain: Secondary | ICD-10-CM | POA: Diagnosis not present

## 2019-04-30 DIAGNOSIS — M79673 Pain in unspecified foot: Secondary | ICD-10-CM

## 2019-04-30 DIAGNOSIS — Z79891 Long term (current) use of opiate analgesic: Secondary | ICD-10-CM | POA: Diagnosis not present

## 2019-04-30 DIAGNOSIS — M791 Myalgia, unspecified site: Secondary | ICD-10-CM

## 2019-04-30 DIAGNOSIS — C3490 Malignant neoplasm of unspecified part of unspecified bronchus or lung: Secondary | ICD-10-CM | POA: Diagnosis not present

## 2019-04-30 MED ORDER — DICLOFENAC SODIUM 1 % EX GEL: 2.0000 g | Freq: Three times a day (TID) | CUTANEOUS | 2 refills | Status: AC | PRN

## 2019-04-30 MED ORDER — FENTANYL 50 MCG/HR TD PT72: 1.0000 | MEDICATED_PATCH | TRANSDERMAL | 0 refills | Status: AC

## 2019-04-30 NOTE — Assessment & Plan Note (Signed)
Known history of HTN. She was on Losartan-HCTZ 100-12.5 mg QD. She was hospitalized in 06/2018 for hypotension and since that time she only takes her medications when her AM BP is >140/90. In the past 2 weeks she has only taken her medication once. Most times her BP is between 130-140/80-90. She has had no further hypotension.   A/P: - Given other health concerns we will discontinue her losartan-HCTZ and monitor her off BP medications.

## 2019-04-30 NOTE — Assessment & Plan Note (Signed)
Patient noted to be tachycardic. She has been tachycardic at multiple visits with heme/onc. Regular on PE. EKG illustrating sinus tachycardia.   Patient feels her HR is increased due to anxiety. At home it is typically in the low 90s.

## 2019-04-30 NOTE — Assessment & Plan Note (Signed)
Follows with Dr. Earlie Server. She is going to follow-up with him on Tuesday.

## 2019-04-30 NOTE — Patient Instructions (Signed)
Thank you for allowing [place] to provide your care. Today I discontinued her blood pressure medication. We will hold off on starting any further medications. You can continue to check your blood pressure periodically if you notice that it is consistently above 140/80 please give [place] a call and we can discuss whether or not we should start a medication.  I have sent out a medication that you can use for joint pains. It is topical so that way it does not hurt your kidneys.  I would like you to come back to see [place] in three months or sooner if any issues arise.

## 2019-04-30 NOTE — Assessment & Plan Note (Signed)
Patient with a significant amount of lumbar back pain and foot pain. She is on hydrocodone and fentanyl patches prescribed by heme/onc. She is hesitant to take more of these medication and would prefer to limit them.   A/P: - Trial of Voltaren gel

## 2019-04-30 NOTE — Progress Notes (Signed)
   CC: HTN, tachycardia, CKD  HPI:  [name] is a 59 y.o. female with PMHx listed below presenting for HTN, CKD, and tachycardia. Please see the A&P for the status of the patient's chronic medical problems.  Past Medical History:  Diagnosis Date  . Anemia   . Arthritis   . GERD (gastroesophageal reflux disease)   . Hypertension   . lung ca dx'd 04/2018  . Morbid obesity ([place]) 05/06/2018  . Supraclavicular adenopathy    Past Surgical History:  Procedure Laterality Date  . CESAREAN SECTION  1988  . HYSTEROSCOPY WITH D & C  06/14/2011   Procedure: DILATATION AND CURETTAGE /HYSTEROSCOPY;  Surgeon: Frederico Hamman, MD;  Location: [facility];  Service: Gynecology;  Laterality: N/A;  . IR CV LINE INJECTION  06/25/2018  . PORTACATH PLACEMENT Left 06/13/2018   Procedure: INSERTION PORT-A-CATH;  Surgeon: [name], MD;  Location: [facility];  Service: Thoracic;  Laterality: Left;  . SUPRACLAVICAL NODE BIOPSY Right 05/10/2018   Procedure: SUPRACLAVICAL NODE BIOPSY;  Surgeon: [name], MD;  Location: [facility];  Service: Thoracic;  Laterality: Right;  . TUBAL LIGATION     Family History: Positive for DM, "bone" cancer, and lymphoma   Social History: Originally from [place]. Moved to [place] over 30 years ago. 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 Retired last January when she was diagnosed with cancer. 3 daughter and 3 grandchildren. 2 daughters and 1 grandchild live in [place]. 1 daughter and 2 grandchildren live in [place]. Denies the use of tobacco, alcohol, or illicit substances.   Review of Systems:  Performed and all others negative.  Physical Exam: Vitals:   04/30/19 1018  BP: (!) 137/94  Pulse: (!) 120  SpO2: 100%  Weight: 265 lb 1.9 oz (120.3 kg)   General: Obese female in no acute distress HENT: Normocephalic, atraumatic, moist mucus membranes Pulm: Good air movement with no wheezing or crackles  CV: RRR, no murmurs, no rubs  Abdomen: Active  bowel sounds, soft, non-distended, no tenderness to palpation  Extremities: Pulses palpable in all extremities, no LE edema  Skin: Warm and dry  Neuro: Alert and oriented x 3  Assessment & Plan:   See Encounters Tab for problem based charting.  Patient discussed with Dr. Lynnae January

## 2019-04-30 NOTE — Assessment & Plan Note (Addendum)
I have reviewed her prior BMPS. Renal function with a slow decline but GFR remains above 30. We will continue to monitor. Patient to avoid NSAIDs and nephrotoxic medications.

## 2019-04-30 NOTE — Telephone Encounter (Signed)
Duragesic increased dose requested. Pts pain is not controlled with 25 mcg. Dtr asking for consideration of a stronger dose. Pt put on her last patch yesterday

## 2019-05-02 ENCOUNTER — Telehealth: Payer: Self-pay | Admitting: Medical Oncology

## 2019-05-02 ENCOUNTER — Other Ambulatory Visit: Payer: Self-pay | Admitting: Physician Assistant

## 2019-05-02 DIAGNOSIS — G893 Neoplasm related pain (acute) (chronic): Secondary | ICD-10-CM

## 2019-05-02 DIAGNOSIS — C3412 Malignant neoplasm of upper lobe, left bronchus or lung: Secondary | ICD-10-CM

## 2019-05-02 MED ORDER — HYDROCODONE-ACETAMINOPHEN 5-325 MG PO TABS: 1.0000 | ORAL_TABLET | Freq: Four times a day (QID) | ORAL | 0 refills | Status: AC | PRN

## 2019-05-02 NOTE — Progress Notes (Signed)
Internal Medicine Clinic Attending  Case discussed with Dr. Helberg at the time of the visit.  We reviewed the resident's history and exam and pertinent patient test results.  I agree with the assessment, diagnosis, and plan of care documented in the resident's note.    

## 2019-05-02 NOTE — Telephone Encounter (Signed)
Med refill requested Hydrocodone

## 2019-05-06 ENCOUNTER — Inpatient Hospital Stay: Payer: Medicaid Other

## 2019-05-06 ENCOUNTER — Telehealth: Payer: Self-pay | Admitting: Medical Oncology

## 2019-05-06 ENCOUNTER — Encounter: Payer: Self-pay | Admitting: Internal Medicine

## 2019-05-06 ENCOUNTER — Inpatient Hospital Stay (HOSPITAL_BASED_OUTPATIENT_CLINIC_OR_DEPARTMENT_OTHER): Payer: Medicaid Other | Admitting: Internal Medicine

## 2019-05-06 ENCOUNTER — Other Ambulatory Visit: Payer: Self-pay

## 2019-05-06 ENCOUNTER — Emergency Department (HOSPITAL_COMMUNITY)
Admission: EM | Admit: 2019-05-06 | Discharge: 2019-05-06 | Disposition: A | Discharge status: Home / Self Care | Payer: Medicaid Other

## 2019-05-06 VITALS — BP 139/100 | HR 105 | Temp 98.5°F | Resp 19

## 2019-05-06 DIAGNOSIS — C3492 Malignant neoplasm of unspecified part of left bronchus or lung: Secondary | ICD-10-CM

## 2019-05-06 DIAGNOSIS — C3412 Malignant neoplasm of upper lobe, left bronchus or lung: Secondary | ICD-10-CM

## 2019-05-06 DIAGNOSIS — Z95828 Presence of other vascular implants and grafts: Secondary | ICD-10-CM

## 2019-05-06 DIAGNOSIS — I10 Essential (primary) hypertension: Secondary | ICD-10-CM

## 2019-05-06 DIAGNOSIS — N182 Chronic kidney disease, stage 2 (mild): Secondary | ICD-10-CM | POA: Diagnosis not present

## 2019-05-06 DIAGNOSIS — T451X5A Adverse effect of antineoplastic and immunosuppressive drugs, initial encounter: Secondary | ICD-10-CM

## 2019-05-06 DIAGNOSIS — C3490 Malignant neoplasm of unspecified part of unspecified bronchus or lung: Secondary | ICD-10-CM | POA: Diagnosis not present

## 2019-05-06 DIAGNOSIS — D6959 Other secondary thrombocytopenia: Secondary | ICD-10-CM

## 2019-05-06 DIAGNOSIS — D6481 Anemia due to antineoplastic chemotherapy: Secondary | ICD-10-CM

## 2019-05-06 DIAGNOSIS — Z5111 Encounter for antineoplastic chemotherapy: Secondary | ICD-10-CM

## 2019-05-06 LAB — CMP (CANCER CENTER ONLY)
ALT: 34 U/L (ref 0–44)
AST: 25 U/L (ref 15–41)
Albumin: 2.9 g/dL — ABNORMAL LOW (ref 3.5–5.0)
Alkaline Phosphatase: 122 U/L (ref 38–126)
Anion gap: 12 (ref 5–15)
BUN: 17 mg/dL (ref 6–20)
CO2: 26 mmol/L (ref 22–32)
Calcium: 8.6 mg/dL — ABNORMAL LOW (ref 8.9–10.3)
Chloride: 105 mmol/L (ref 98–111)
Creatinine: 1.92 mg/dL — ABNORMAL HIGH (ref 0.44–1.00)
GFR, Est AFR Am: 33 mL/min — ABNORMAL LOW (ref >60)
GFR, Estimated: 28 mL/min — ABNORMAL LOW (ref >60)
Glucose, Bld: 106 mg/dL — ABNORMAL HIGH (ref 70–99)
Potassium: 3.5 mmol/L (ref 3.5–5.1)
Sodium: 143 mmol/L (ref 135–145)
Total Bilirubin: 0.8 mg/dL (ref 0.3–1.2)
Total Protein: 6.8 g/dL (ref 6.5–8.1)

## 2019-05-06 LAB — MAGNESIUM: Magnesium: 2.2 mg/dL (ref 1.7–2.4)

## 2019-05-06 LAB — CBC WITH DIFFERENTIAL (CANCER CENTER ONLY)
Abs Immature Granulocytes: 0.01 10*3/uL (ref 0.00–0.07)
Basophils Absolute: 0 10*3/uL (ref 0.0–0.1)
Basophils Relative: 0 %
Eosinophils Absolute: 0 10*3/uL (ref 0.0–0.5)
Eosinophils Relative: 1 %
HCT: 24.8 % — ABNORMAL LOW (ref 36.0–46.0)
Hemoglobin: 8.1 g/dL — ABNORMAL LOW (ref 12.0–15.0)
Immature Granulocytes: 0 %
Lymphocytes Relative: 20 %
Lymphs Abs: 0.7 10*3/uL (ref 0.7–4.0)
MCH: 29.5 pg (ref 26.0–34.0)
MCHC: 32.7 g/dL (ref 30.0–36.0)
MCV: 90.2 fL (ref 80.0–100.0)
Monocytes Absolute: 0.4 10*3/uL (ref 0.1–1.0)
Monocytes Relative: 10 %
Neutro Abs: 2.6 10*3/uL (ref 1.7–7.7)
Neutrophils Relative %: 69 %
Platelet Count: 73 10*3/uL — ABNORMAL LOW (ref 150–400)
RBC: 2.75 MIL/uL — ABNORMAL LOW (ref 3.87–5.11)
RDW: 16 % — ABNORMAL HIGH (ref 11.5–15.5)
WBC Count: 3.7 10*3/uL — ABNORMAL LOW (ref 4.0–10.5)
nRBC: 0 % (ref 0.0–0.2)

## 2019-05-06 MED ORDER — MORPHINE SULFATE (PF) 4 MG/ML IV SOLN
INTRAVENOUS | Status: AC
  Filled 2019-05-06: qty 1

## 2019-05-06 MED ORDER — HEPARIN SOD (PORK) LOCK FLUSH 100 UNIT/ML IV SOLN
500.0000 [IU] | Freq: Once | INTRAVENOUS | Status: AC | PRN
  Administered 2019-05-06: 500 [IU]
  Filled 2019-05-06: qty 5

## 2019-05-06 MED ORDER — MORPHINE SULFATE 4 MG/ML IJ SOLN
2.0000 mg | Freq: Once | INTRAMUSCULAR | Status: AC
  Administered 2019-05-06: 2 mg via INTRAVENOUS
  Filled 2019-05-06: qty 1

## 2019-05-06 MED ORDER — SODIUM CHLORIDE 0.9% FLUSH
10.0000 mL | INTRAVENOUS | Status: DC | PRN
  Administered 2019-05-06: 10 mL
  Filled 2019-05-06: qty 10

## 2019-05-06 MED ORDER — SODIUM CHLORIDE 0.9% FLUSH
10.0000 mL | INTRAVENOUS | Status: DC | PRN
  Filled 2019-05-06: qty 10

## 2019-05-06 NOTE — ED Triage Notes (Signed)
Patient brought in via EMS, having upper right leg pain. Patient placed in room. Patient's daughter in lobby stating patient will not be seen at ED as patient is a cancer center patient. Patient's daughter has coordinated with cancer center to get patient there. Patient in agreement to go.  Patient left facility with daughter prior to being triaged.

## 2019-05-06 NOTE — Telephone Encounter (Signed)
Hospice referral made to [facility].

## 2019-05-06 NOTE — Progress Notes (Signed)
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  PRIOR THERAPY:  1) Carboplatin for AUC of 5 on day 1, etoposide 100 mg/M2 on days 1, 2 and 3 with Neulasta support in addition to [place].First dose February 10th, 2020. Status post 4 cycles. 2) Maintenance treatment with Tecentriq 1200 mg IV every 3 weeks.  First dose Sep 10, 2018.  Status post 3 cycles.  This was discontinued secondary to disease progression. 3)  Systemic chemotherapy with cisplatin 30 mg/M2 and irinotecan 65 mg/M2 on days 1 and 8 every 3 weeks.  First dose October 28, 2018.  Status post 7 cycles.  This was discontinued secondary to disease progression.  CURRENT THERAPY:  Palliative and hospice care.  INTERVAL HISTORY: Samantha Santos 59 y.o. female came to the clinic today on a stretcher by EMS for follow-up visit accompanied by her daughter.  The patient mentioned that she has a lot of pain in the hips bilaterally.  She is currently on fentanyl patch as well as Norco for pain management.  She denied having any current chest pain, shortness of breath, cough or hemoptysis.  She denied having any fever or chills.  She continues to have general weakness.  She has no nausea, vomiting, diarrhea or constipation.  She denied having any headache or visual changes.  She has a rough time tolerating her previous systemic chemotherapy with increasing fatigue and weakness as well as pancytopenia.  The patient had repeat CT scan of the chest, abdomen pelvis performed recently and she is here for evaluation and discussion of her scan results.   MEDICAL HISTORY: Past Medical History:  Diagnosis Date  . Anemia   . Arthritis   . GERD (gastroesophageal reflux disease)   . Hypertension   .  lung ca dx'd 04/2018  . Morbid obesity ([facility]) 05/06/2018  . Supraclavicular adenopathy     ALLERGIES:  is allergic to penicillins and oxycodone.  MEDICATIONS:  Current Outpatient Medications  Medication Sig Dispense Refill  . acetaminophen (TYLENOL) 500 MG tablet Take 500 mg by mouth every 6 (six) hours as needed.    . diclofenac Sodium (VOLTAREN) 1 % GEL Apply 2 g topically 3 (three) times daily as needed. 150 g 2  . diphenoxylate-atropine (LOMOTIL) 2.5-0.025 MG tablet 1 to 2 tablets 4 times daily as needed for diarrhea (Patient not taking: Reported on 04/15/2019) 40 tablet 1  . fentaNYL (DURAGESIC) 50 MCG/HR Place 1 patch onto the skin every 3 (three) days. 5 patch 0  . gabapentin (NEURONTIN) 100 MG capsule Take 1 capsule (100 mg total) by mouth at bedtime. 30 capsule 2  . HYDROcodone-acetaminophen (NORCO) 5-325 MG tablet Take 1 tablet by mouth every 6 (six) hours as needed for severe pain. 30 tablet 0  . lidocaine-prilocaine (EMLA) cream Apply 1 application topically as needed (chemo port). 30 g 0  . loratadine (CLARITIN) 10 MG tablet Take 10 mg by mouth daily.    Marland Kitchen LORazepam (ATIVAN) 0.5 MG tablet Place 1 tablet (0.5 mg total) under the tongue every 6 (six) hours as needed (Nausea). 30 tablet 1  . prochlorperazine (COMPAZINE) 10 MG tablet Take 1 tablet (10 mg total) by mouth every 6 (six) hours as needed for nausea or vomiting. 50 tablet 3  .  rivaroxaban (XARELTO) 20 MG TABS tablet Take 1 tablet (20 mg total) by mouth daily with supper. 30 tablet 2   No current facility-administered medications for this visit.   Facility-Administered Medications Ordered in Other Visits  Medication Dose Route Frequency Provider Last Rate Last Admin  . sodium chloride flush (NS) 0.9 % injection 10 mL  10 mL Intracatheter PRN Curt Bears, MD        SURGICAL HISTORY:  Past Surgical History:  Procedure Laterality Date  . CESAREAN SECTION  1988  . HYSTEROSCOPY WITH D & C  06/14/2011   Procedure:  DILATATION AND CURETTAGE /HYSTEROSCOPY;  Surgeon: Frederico Hamman, MD;  Location: [facility];  Service: Gynecology;  Laterality: N/A;  . IR CV LINE INJECTION  06/25/2018  . PORTACATH PLACEMENT Left 06/13/2018   Procedure: INSERTION PORT-A-CATH;  Surgeon: [name], MD;  Location: [facility];  Service: Thoracic;  Laterality: Left;  . SUPRACLAVICAL NODE BIOPSY Right 05/10/2018   Procedure: SUPRACLAVICAL NODE BIOPSY;  Surgeon: [name], MD;  Location: [facility];  Service: Thoracic;  Laterality: Right;  . TUBAL LIGATION      REVIEW OF SYSTEMS:  Constitutional: positive for fatigue and weight loss Eyes: negative Ears, nose, mouth, throat, and face: negative Respiratory: negative Cardiovascular: negative Gastrointestinal: negative Genitourinary:negative Integument/breast: negative Hematologic/lymphatic: negative Musculoskeletal:positive for arthralgias, bone pain and muscle weakness Neurological: negative Behavioral/Psych: negative Endocrine: negative Allergic/Immunologic: negative   PHYSICAL EXAMINATION: General appearance: alert, cooperative, fatigued and no distress Head: Normocephalic, without obvious abnormality, atraumatic Neck: no adenopathy, no JVD, supple, symmetrical, trachea midline and thyroid not enlarged, symmetric, no tenderness/mass/nodules Lymph nodes: Cervical, supraclavicular, and axillary nodes normal. Resp: clear to auscultation bilaterally Back: symmetric, no curvature. ROM normal. No CVA tenderness. Cardio: regular rate and rhythm, S1, S2 normal, no murmur, click, rub or gallop GI: soft, non-tender; bowel sounds normal; no masses,  no organomegaly Extremities: extremities normal, atraumatic, no cyanosis or edema Neurologic: Alert and oriented X 3, normal strength and tone. Normal symmetric reflexes. Normal coordination and gait  ECOG PERFORMANCE STATUS: 1 - Symptomatic but completely ambulatory  There were no vitals taken for this visit.  LABORATORY  DATA: Lab Results  Component Value Date   WBC 1.1 (L) 04/22/2019   HGB 7.9 (L) 04/22/2019   HCT 23.4 (L) 04/22/2019   MCV 86.7 04/22/2019   PLT 58 (L) 04/22/2019      Chemistry      Component Value Date/Time   NA 139 04/22/2019 0810   NA 144 04/08/2018 1145   K 3.5 04/22/2019 0810   CL 105 04/22/2019 0810   CO2 26 04/22/2019 0810   BUN 24 (H) 04/22/2019 0810   BUN 10 04/08/2018 1145   CREATININE 1.73 (H) 04/22/2019 0810      Component Value Date/Time   CALCIUM 8.6 (L) 04/22/2019 0810   ALKPHOS 73 04/22/2019 0810   AST 9 (L) 04/22/2019 0810   ALT 17 04/22/2019 0810   BILITOT 1.0 04/22/2019 0810       RADIOGRAPHIC STUDIES: CT Chest W Contrast  Result Date: 04/23/2019 CLINICAL DATA:  Extensive stage small cell lung cancer, chemotherapy and immunotherapy ongoing, XRT complete EXAM: CT CHEST, ABDOMEN, AND PELVIS WITH CONTRAST TECHNIQUE: Multidetector CT imaging of the chest, abdomen and pelvis was performed following the standard protocol during bolus administration of intravenous contrast. CONTRAST:  53mL OMNIPAQUE IOHEXOL 300 MG/ML  SOLN COMPARISON:  01/28/2019 FINDINGS: CT CHEST FINDINGS Cardiovascular: Mild cardiomegaly. Small pericardial effusion. No evidence of thoracic aortic aneurysm. Mild  atherosclerotic calcifications of the aortic arch. Right chest port terminates the cavoatrial junction. Mediastinum/Nodes: Improving prevascular nodal soft tissue, measuring up to 2.0 cm short axis (series 2/image 18), previously 3.5 cm. Prior subcarinal node has essentially resolved. Mildly progressive right epicardial nodes, measuring up to 3.1 cm short axis (series 2/image 32), previously 2.1 cm. Visualized thyroid is unremarkable. Lungs/Pleura: No suspicious pulmonary nodules. Prior patchy right lower lobe opacity has resolved. Left lower lobe opacity with air bronchograms, favoring compressive atelectasis. Trace right basilar atelectasis. Small bilateral pleural effusions, unchanged. No  pneumothorax. Musculoskeletal: Mild degenerative changes of the thoracic spine. CT ABDOMEN PELVIS FINDINGS Hepatobiliary: Approximately 8-10 small liver lesions are suspected in both lobes of the liver, new, compatible with metastases. For example: --11 mm lesion in segment 8 (series 2/image 36) --13 mm lesion in segment 4B (series 2/image 49) Gallbladder is unremarkable. No intrahepatic or extrahepatic ductal dilatation. Pancreas: Within normal limits. Spleen: Within normal limits. Adrenals/Urinary Tract: Adrenal glands are within normal limits. Kidneys are within normal limits. No hydronephrosis. Bladder is underdistended but unremarkable. Stomach/Bowel: Stomach is notable for a tiny hiatal hernia. No evidence of bowel obstruction. Normal appendix (series 2/image 86). Vascular/Lymphatic: No evidence of abdominal aortic aneurysm. Epicardial/right juxtadiaphragmatic nodes, as above. Otherwise, no suspicious abdominopelvic lymphadenopathy. Reproductive: Mildly heterogeneous appearance of the uterus, possibly reflecting uterine fibroids (series 2/image 93), although these were not evident on the prior. Suspected 3.3 x 5.8 cm right adnexal mass (series 2/image 89), new, worrisome for metastatic disease. While there is prominent soft tissue in the left lower pelvis (series 2/image 91), much of this appearance is favored to reflect normal bowel loops, although superimposed mesenteric soft tissue is difficult to exclude. Other: Small volume pelvic ascites, new. Musculoskeletal: Mild degenerative changes of the lumbar spine. IMPRESSION: Improving mediastinal nodal metastases. Small bilateral pleural effusions. Progressive right epicardial/juxtadiaphragmatic nodal metastases. New hepatic metastases, with index lesions as above. Suspected new 5.8 cm right adnexal mass, worrisome for metastatic disease. Additional heterogeneous appearance of the uterus and left lower pelvis could reflect uterine fibroids and normal bowel  loops, although additional sites of metastases are not excluded. Attention on follow-up is suggested. Small volume pelvic ascites. Electronically Signed   By: Julian Hy M.D.   On: 04/23/2019 09:59   CT Abdomen Pelvis W Contrast  Result Date: 04/23/2019 CLINICAL DATA:  Extensive stage small cell lung cancer, chemotherapy and immunotherapy ongoing, XRT complete EXAM: CT CHEST, ABDOMEN, AND PELVIS WITH CONTRAST TECHNIQUE: Multidetector CT imaging of the chest, abdomen and pelvis was performed following the standard protocol during bolus administration of intravenous contrast. CONTRAST:  50mL OMNIPAQUE IOHEXOL 300 MG/ML  SOLN COMPARISON:  01/28/2019 FINDINGS: CT CHEST FINDINGS Cardiovascular: Mild cardiomegaly. Small pericardial effusion. No evidence of thoracic aortic aneurysm. Mild atherosclerotic calcifications of the aortic arch. Right chest port terminates the cavoatrial junction. Mediastinum/Nodes: Improving prevascular nodal soft tissue, measuring up to 2.0 cm short axis (series 2/image 18), previously 3.5 cm. Prior subcarinal node has essentially resolved. Mildly progressive right epicardial nodes, measuring up to 3.1 cm short axis (series 2/image 32), previously 2.1 cm. Visualized thyroid is unremarkable. Lungs/Pleura: No suspicious pulmonary nodules. Prior patchy right lower lobe opacity has resolved. Left lower lobe opacity with air bronchograms, favoring compressive atelectasis. Trace right basilar atelectasis. Small bilateral pleural effusions, unchanged. No pneumothorax. Musculoskeletal: Mild degenerative changes of the thoracic spine. CT ABDOMEN PELVIS FINDINGS Hepatobiliary: Approximately 8-10 small liver lesions are suspected in both lobes of the liver, new, compatible with metastases. For example: --11 mm  lesion in segment 8 (series 2/image 36) --13 mm lesion in segment 4B (series 2/image 49) Gallbladder is unremarkable. No intrahepatic or extrahepatic ductal dilatation. Pancreas: Within  normal limits. Spleen: Within normal limits. Adrenals/Urinary Tract: Adrenal glands are within normal limits. Kidneys are within normal limits. No hydronephrosis. Bladder is underdistended but unremarkable. Stomach/Bowel: Stomach is notable for a tiny hiatal hernia. No evidence of bowel obstruction. Normal appendix (series 2/image 86). Vascular/Lymphatic: No evidence of abdominal aortic aneurysm. Epicardial/right juxtadiaphragmatic nodes, as above. Otherwise, no suspicious abdominopelvic lymphadenopathy. Reproductive: Mildly heterogeneous appearance of the uterus, possibly reflecting uterine fibroids (series 2/image 93), although these were not evident on the prior. Suspected 3.3 x 5.8 cm right adnexal mass (series 2/image 89), new, worrisome for metastatic disease. While there is prominent soft tissue in the left lower pelvis (series 2/image 91), much of this appearance is favored to reflect normal bowel loops, although superimposed mesenteric soft tissue is difficult to exclude. Other: Small volume pelvic ascites, new. Musculoskeletal: Mild degenerative changes of the lumbar spine. IMPRESSION: Improving mediastinal nodal metastases. Small bilateral pleural effusions. Progressive right epicardial/juxtadiaphragmatic nodal metastases. New hepatic metastases, with index lesions as above. Suspected new 5.8 cm right adnexal mass, worrisome for metastatic disease. Additional heterogeneous appearance of the uterus and left lower pelvis could reflect uterine fibroids and normal bowel loops, although additional sites of metastases are not excluded. Attention on follow-up is suggested. Small volume pelvic ascites. Electronically Signed   By: Julian Hy M.D.   On: 04/23/2019 09:59    ASSESSMENT AND PLAN: This is a very pleasant 59 years old African-American female with stage IV high-grade neuroendocrine carcinoma and underwent systemic chemotherapy with carboplatin and etoposide and Tecentriq status post 7  cycles. Starting from cycle #5 the patient has been on treatment with maintenance Tecentriq status post 3 cycles. This treatment was discontinued secondary to disease progression. The patient is currently on treatment with systemic chemotherapy with cisplatin 30 mg/M2 and irinotecan 65 mg/M2 on days 1 and 8 every 3 weeks status post 7 cycles.  The patient has a rough time with this treatment with significant chemotherapy induced pancytopenia and fatigue. She had repeat CT scan of the chest, abdomen pelvis performed recently.  I personally and independently reviewed the scans and discussed the results with the patient and her daughter.  The scan showed improvement of the disease in the chest with improvement in the mediastinal nodal metastasis as well as bilateral pulmonary nodule but unfortunately there is evidence for disease progression in the liver as well as the right adnexa worrisome for metastatic disease.  There was no clear bone disease on the scan. I had a lengthy discussion with the patient and her daughter about her condition.  Unfortunately with her current disease progression her future treatment options are very limited especially with her current condition and poor performance status. I strongly recommend for the patient to consider palliative care and hospice at this point.  The patient and her daughter are in agreement with the current plan.  We will call the hospice care of [facility] to see the patient. For pain management I will add fentanyl patch 50 mcg/hour every 3 days in addition to Norco for the breakthrough pain.  She will also continue on gabapentin.  We will give her a dose of morphine sulfate 2 mg IV in the clinic today before returning back home. For the history of pulmonary embolism she is currently on treatment with Xarelto but this may be discontinued by hospice depending on  her condition. I will see the patient on as-needed basis at this point. She was advised to call if  she has any other concerning symptoms. All questions were answered. The patient knows to call the clinic with any problems, questions or concerns. We can certainly see the patient much sooner if necessary.  Disclaimer: This note was dictated with voice recognition software. Similar sounding words can inadvertently be transcribed and may not be corrected upon review.

## 2019-05-06 NOTE — Progress Notes (Signed)
Patient will not receive treatment today but will instead be transported home with hospice services. Received verbal order for morphine 2mg  IV per Dr. Julien Nordmann. Abelina Bachelor RN is setting up transport via stretcher home.  Patient was dc'd home via [facility]

## 2019-05-06 NOTE — Telephone Encounter (Signed)
Transportation arranged to pick up pt via stretcher and take her home.

## 2019-05-13 ENCOUNTER — Other Ambulatory Visit: Payer: Medicaid Other

## 2019-05-13 ENCOUNTER — Ambulatory Visit: Payer: Medicaid Other

## 2019-05-14 ENCOUNTER — Ambulatory Visit: Payer: Medicaid Other

## 2019-05-27 ENCOUNTER — Telehealth: Payer: Self-pay | Admitting: Medical Oncology

## 2019-05-27 NOTE — Telephone Encounter (Signed)
Pt expired 05/19/2019 at 326 am

## 2019-05-27 NOTE — Telephone Encounter (Signed)
[facility] called to tell us pt died.

## 2019-06-16 DEATH — deceased

## 2019-07-28 ENCOUNTER — Telehealth: Payer: Self-pay | Admitting: Medical Oncology

## 2019-07-28 NOTE — Telephone Encounter (Signed)
LVM to call back re Papers and forms she is requesting.

## 2019-07-29 ENCOUNTER — Telehealth: Payer: Self-pay | Admitting: Internal Medicine

## 2019-07-29 NOTE — Telephone Encounter (Signed)
Attempted to contact's patient's daughter, Samantha Santos, in reference to [employer] form dropped off to be completed by Dr. Tarri Abernethy.  No answer left detailed message stating that pages 1 and 2 are missing and page 4 needs to be signed before form can be completed. If she calls back please let her know or transfer call to [place].

## 2019-07-30 ENCOUNTER — Telehealth: Payer: Self-pay | Admitting: Medical Oncology

## 2019-07-30 NOTE — Telephone Encounter (Signed)
VM for Samantha Santos to pick up insurance statement.

## 2019-08-05 NOTE — Telephone Encounter (Signed)
Paperwork is complete in my box. Thanks.

## 2019-08-21 ENCOUNTER — Encounter: Payer: Self-pay | Admitting: *Deleted

## 2020-03-30 IMAGING — DX DG CHEST 1V PORT
1 series · 2 of 2 positions shown · non-contrast
Comparison: 06/13/2018

CLINICAL DATA: Port-A-Cath placement

EXAM:
PORTABLE CHEST 1 VIEW

[Series 1: chest · 0.14mm/px · 2 of 2 slices shown]
[im 1/2]
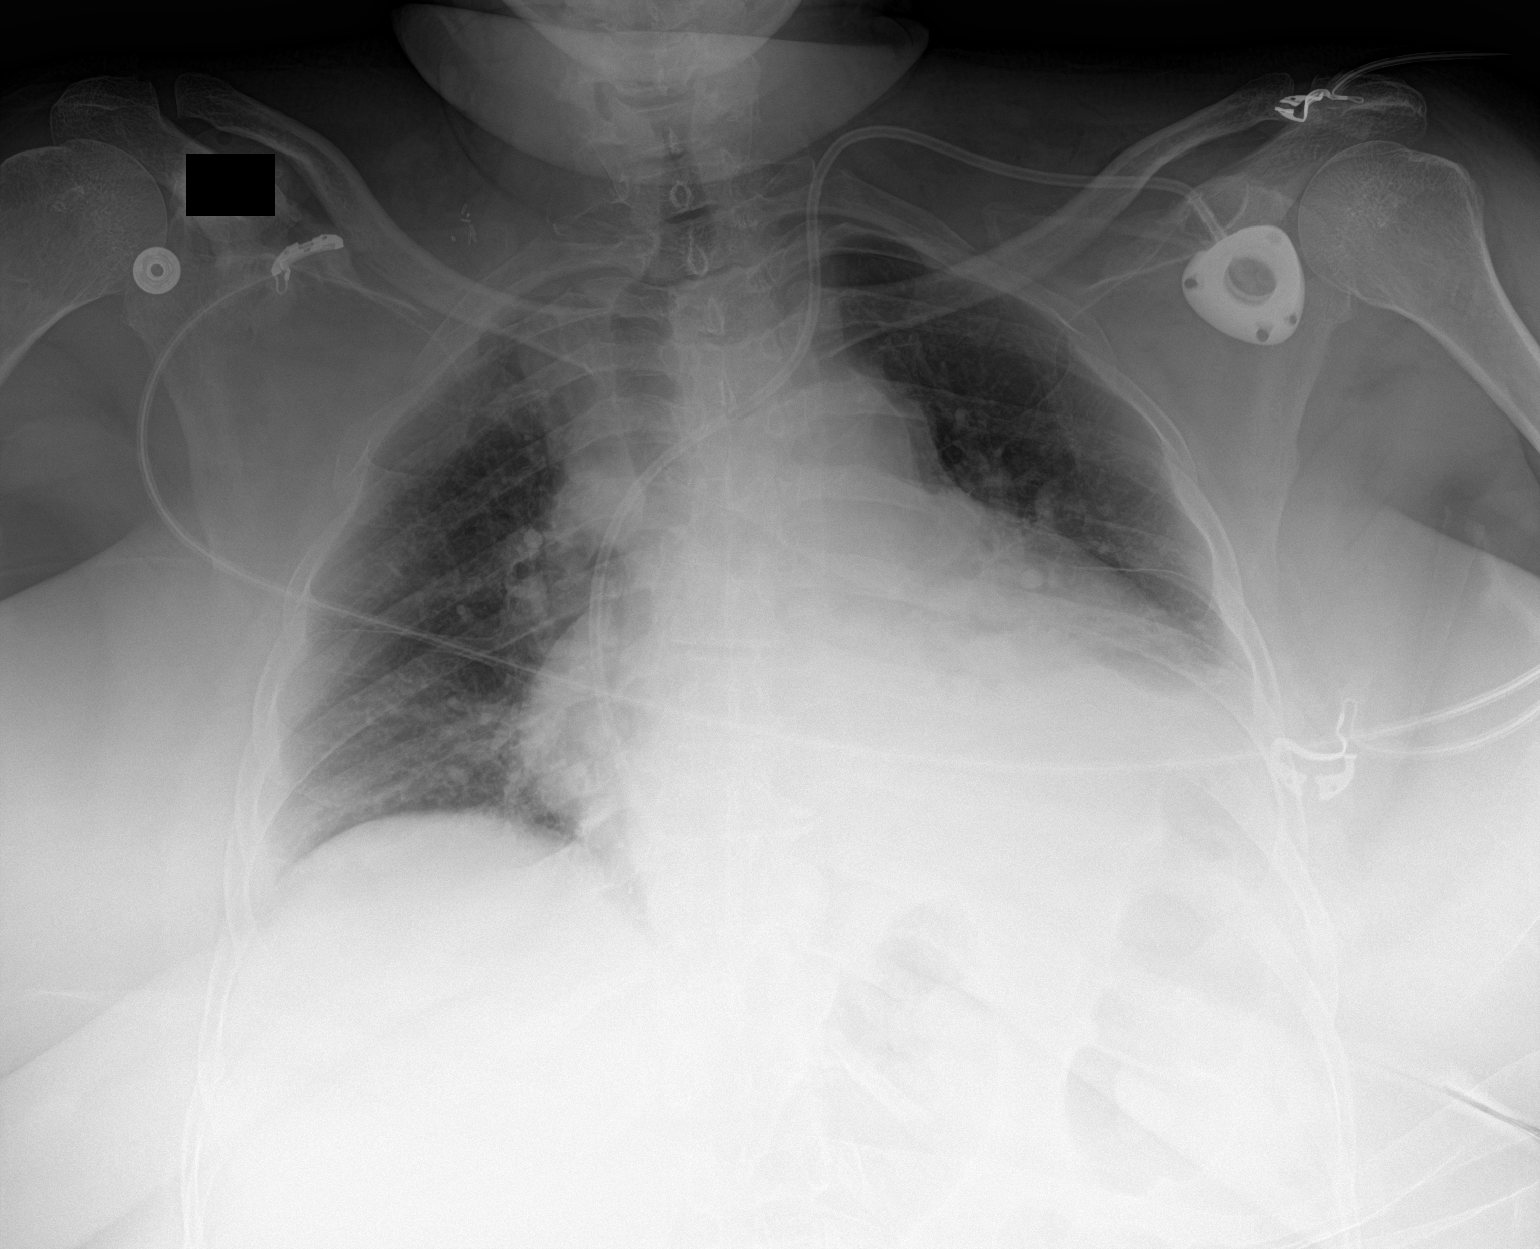
[im 2/2]
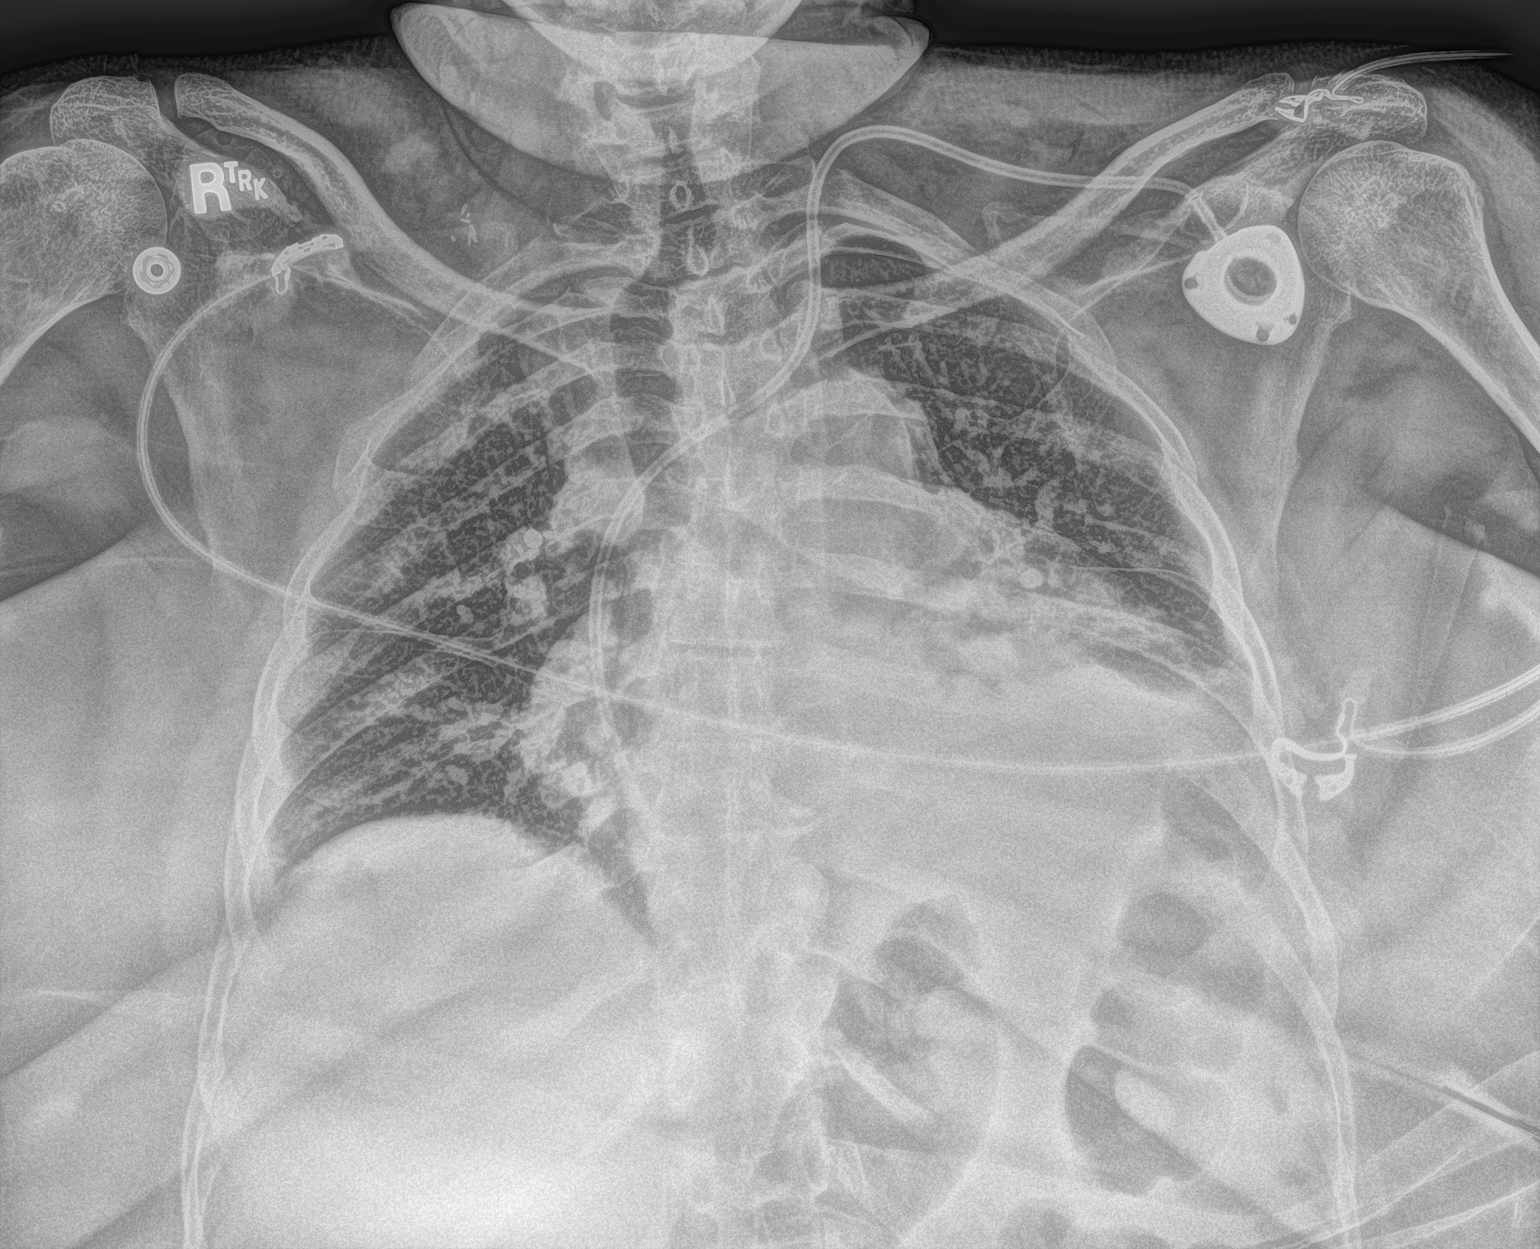

[2 of 2 positions shown; findings below may reference images not displayed]

FINDINGS: Left internal jugular Port-A-Cath has been placed. The tip is in the
right atrium approximately 5 cm deep to the cavoatrial junction.
There are low lung volumes. Cardiomegaly with vascular congestion.
Left lower lobe atelectasis or infiltrate. No pneumothorax. No acute
bony abnormality.
IMPRESSION: Cardiomegaly, vascular congestion. Left lower lobe atelectasis or
infiltrate. Low lung volumes.

Left Port-A-Cath tip in the right atrium approximately 5 cm deep to
the cavoatrial junction. No pneumothorax.

## 2020-11-14 IMAGING — CT CT ABD-PELV W/ CM
5 of 10 series · 15 of 36 positions shown, 16 images · IV contrast (APPLIED)
Comparison: 12/17/2018, 10/16/2018, 06/24/2018

CLINICAL DATA: Left lung cancer, chemotherapy ongoing, worsening
chest and back pain

EXAM:
CT CHEST, ABDOMEN, AND PELVIS WITH CONTRAST
TECHNIQUE: Multidetector CT imaging of the chest, abdomen and pelvis was
performed following the standard protocol during bolus
administration of intravenous contrast.
CONTRAST:  100mL OMNIPAQUE IOHEXOL 300 MG/ML  SOLN

[Series 2: cap with · axial · 0.80mm/px · z∈[-296,+49]mm · 4 of 115 slices shown (1 of 2)]
[im 23/115  lung]
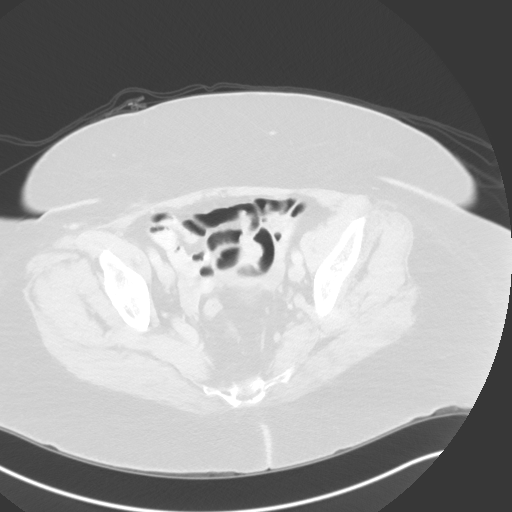
[im 46/115  lung]
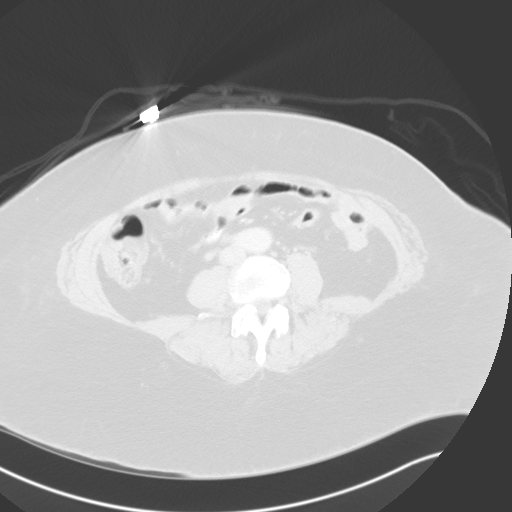
[im 69/115  lung]
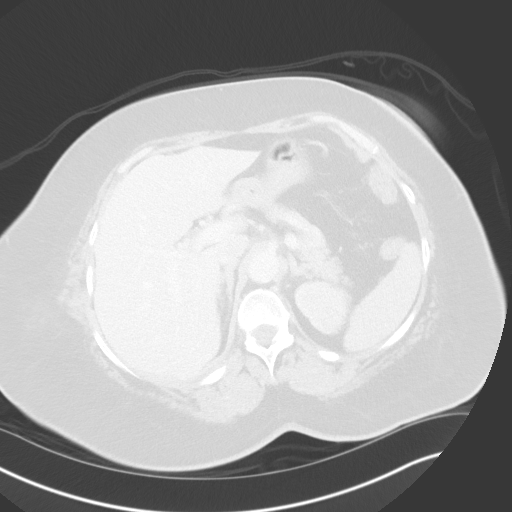
[im 92/115  lung]
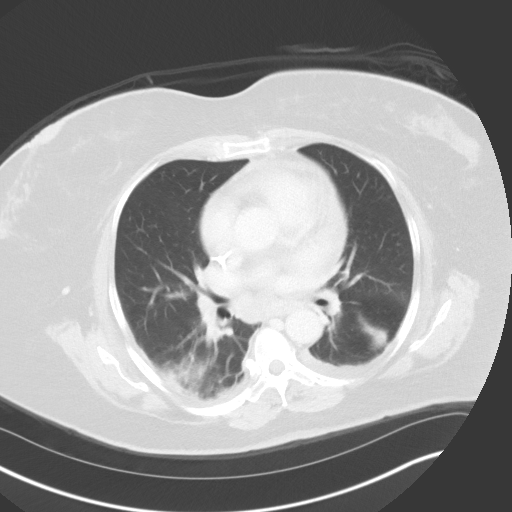

[Series 2: cap with · axial · 0.80mm/px · z∈[-296,+49]mm · 4 of 115 slices shown, 5 images (2 of 2)]
[im 23/115  mediastinal]
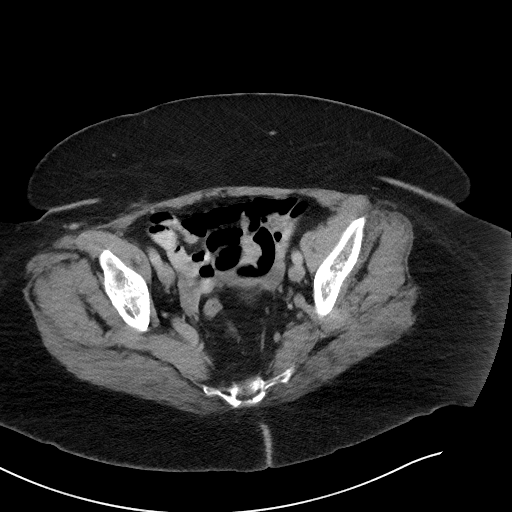
[im 23/115  lung]
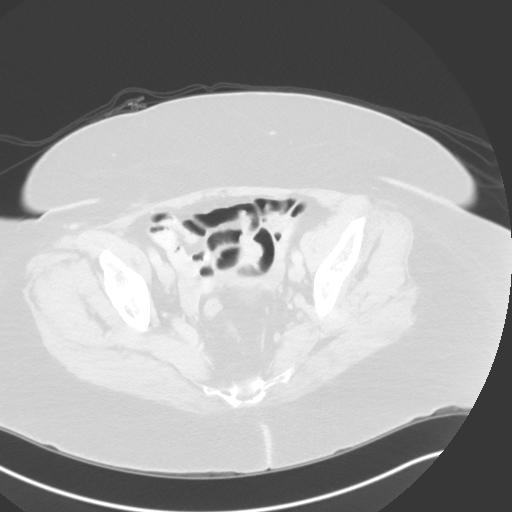
[im 46/115  lung]
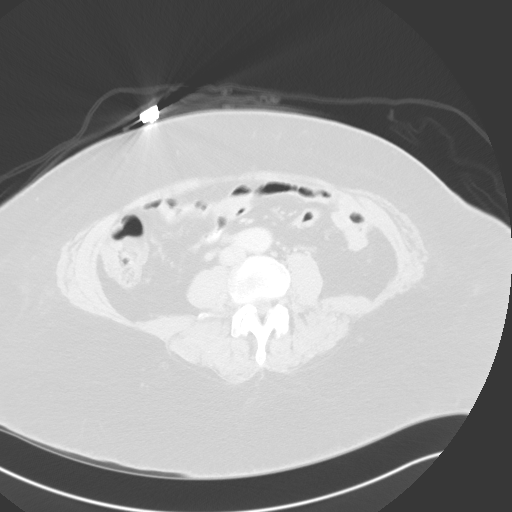
[im 69/115  lung]
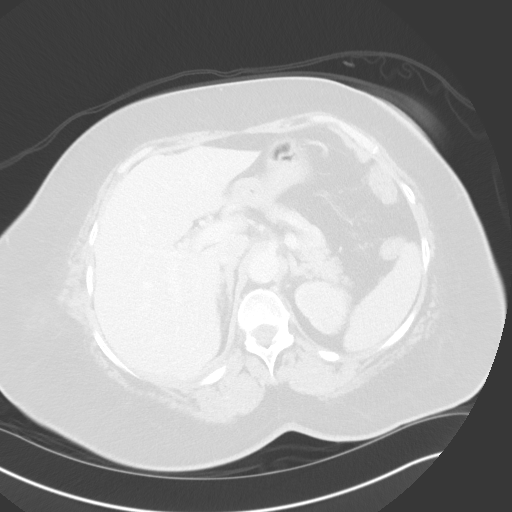
[im 92/115  lung]
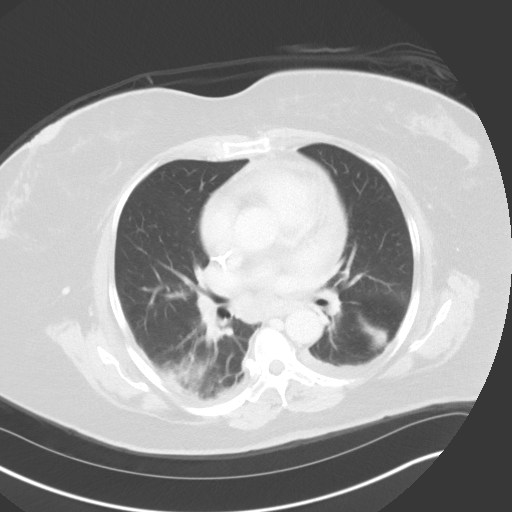

[Series 4: lung · axial · 0.80mm/px · z∈[-12,+32]mm · 2 of 110 slices shown (1 of 2)]
[im 22/110  lung]
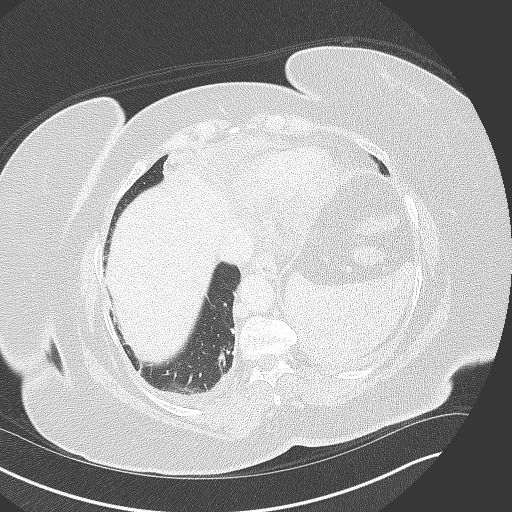
[im 44/110  lung]
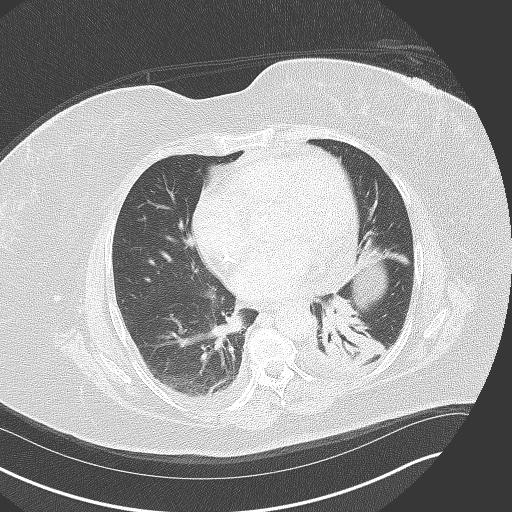

[Series 4: lung · axial · 0.80mm/px · z∈[-12,+120]mm · 4 of 110 slices shown (2 of 2)]
[im 22/110  lung]
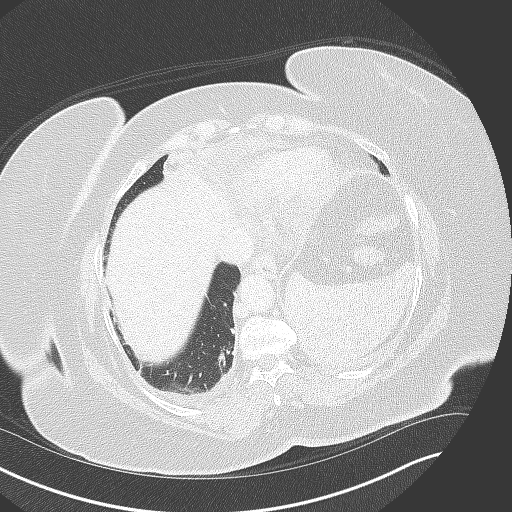
[im 44/110  lung]
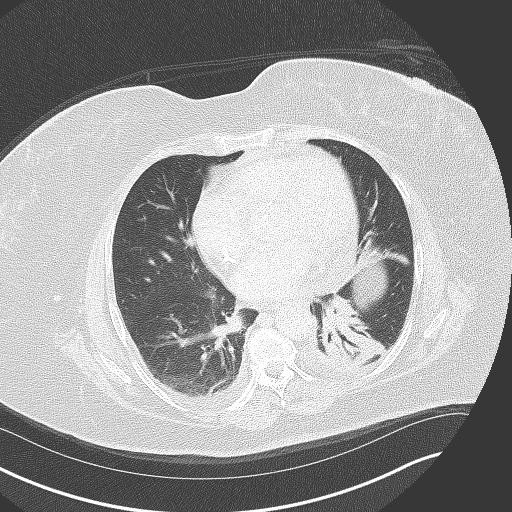
[im 66/110  lung]
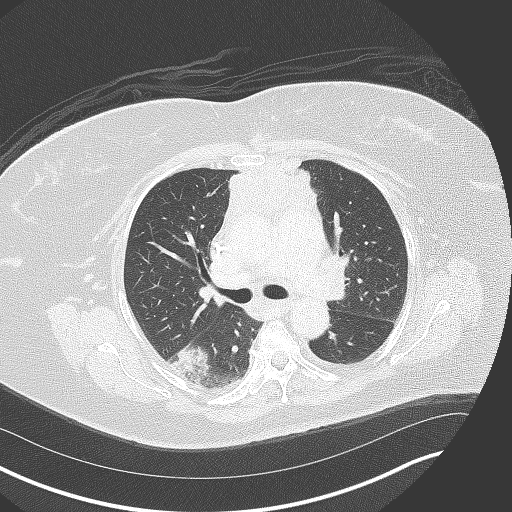
[im 88/110  lung]
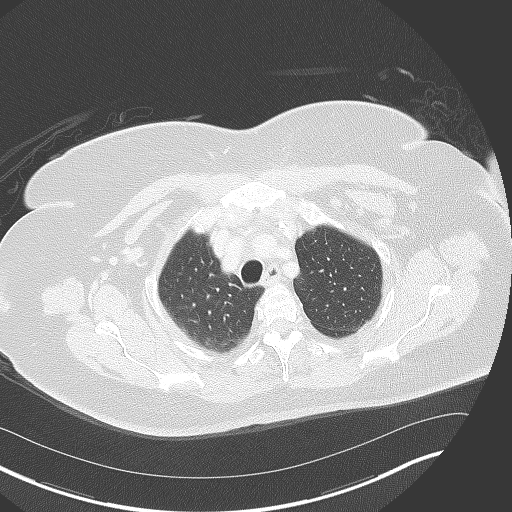

[Series 5: coronals · coronal · 0.82mm/px · 1 of 152 slices shown]
[im 76/152  lung]
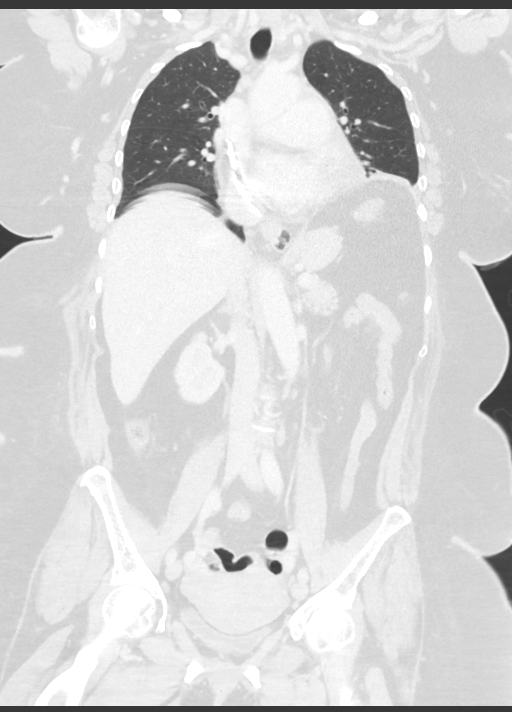

[15 of 36 positions shown; findings below may reference images not displayed]

FINDINGS: CT CHEST FINDINGS

Cardiovascular: Left chest port catheter. Normal heart size. Trace
pericardial effusion.

Mediastinum/Nodes: Redemonstrated bulky soft tissue nodules of the
anterior mediastinum and AP window, slightly increased in size
compared to prior examination measuring 6.8 x 3.8 cm, previously
x 3.2 cm (series 2, image 17). No change in subcarinal and hilar
lymph nodes. Interval decrease in size of a left axillary lymph
node, now measuring 1.4 x 0.7 cm, previously 2.2 x 1.5 cm (series 2,
image 12). Thyroid gland, trachea, and esophagus demonstrate no
significant findings.

Lungs/Pleura: There are new trace bilateral pleural effusions. There
is a new, wedge-shaped subpleural ground-glass opacity of the
superior segment right lower lobe with some suggestion of central
clearing (series 4, image 51). Redemonstrated bandlike scarring of
the left lower lobe.

Musculoskeletal: Interval increase in size of an extrathoracic soft
tissue nodule, abutting the left second sternocostal junction and
external to anterior mediastinal soft tissue. This nodule measures
2.4 x 2.0 cm, previously 1.7 x 1.6 cm when measured similarly
(series 2, image 16). No change in sclerotic appearance of the
manubrium.

CT ABDOMEN PELVIS FINDINGS

Hepatobiliary: No solid liver abnormality is seen. No gallstones,
gallbladder wall thickening, or biliary dilatation.

Pancreas: Unremarkable. No pancreatic ductal dilatation or
surrounding inflammatory changes.

Spleen: Normal in size without significant abnormality.

Adrenals/Urinary Tract: Adrenal glands are unremarkable. Kidneys are
normal, without renal calculi, solid lesion, or hydronephrosis.
Bladder is unremarkable.

Stomach/Bowel: Stomach is within normal limits. Appendix appears
normal. No evidence of bowel wall thickening, distention, or
inflammatory changes.

Vascular/Lymphatic: No significant vascular findings are present. No
enlarged abdominal or pelvic lymph nodes.

Reproductive: No mass or other abnormality.

Other: No abdominal wall hernia or abnormality. No abdominopelvic
ascites.

Musculoskeletal: No acute or significant osseous findings.
IMPRESSION: 1. There is a new, wedge-shaped subpleural ground-glass opacity of
the superior segment right lower lobe with some suggestion of
central clearing (series 4, image 51). This appearance is concerning
for pulmonary embolus and pulmonary infarct, particularly given
acute clinical presentation of chest pain. There is no direct
evidence of pulmonary embolism on this examination not tailored for
assessment of the pulmonary arteries. Consider CT angiogram to
further evaluate.
2. Redemonstrated bulky soft tissue nodules of the anterior
mediastinum and AP window, slightly increased in size compared to
prior examination measuring 6.8 x 3.8 cm, previously 6.0 x 3.2 cm
(series 2, image 17).
3. Interval increase in size of an extrathoracic soft tissue nodule,
abutting the left second sternocostal junction and external to
anterior mediastinal soft tissue. This nodule measures 2.4 x 2.0 cm,
previously 1.7 x 1.6 cm when measured similarly (series 2, image
16). This may correspond to reported palpable abnormality and source
of pain. No change in sclerotic appearance of the manubrium.
4. Findings above are consistent with worsening metastatic disease.
5. Decreased size of left axillary lymph node, of uncertain
significance, possibly reflecting mixed response to treatment.
6. No evidence of metastatic disease in the abdomen or pelvis.
7. New trace bilateral pleural effusions.

These results will be called to the ordering clinician or
representative by the Radiologist Assistant, and communication
documented in the PACS or z[employer]ision Dashboard.

## 2022-07-18 ENCOUNTER — Other Ambulatory Visit: Payer: Self-pay
